# Patient Record
Sex: Male | Born: 1955
Health system: Southern US, Community
[De-identification: ages and names within clinical notes are randomized; demographics above are authoritative.]

## PROBLEM LIST (undated history)

## (undated) DIAGNOSIS — R21 Rash and other nonspecific skin eruption: Secondary | ICD-10-CM

## (undated) DIAGNOSIS — I491 Atrial premature depolarization: Secondary | ICD-10-CM

## (undated) DIAGNOSIS — M545 Low back pain, unspecified: Secondary | ICD-10-CM

## (undated) DIAGNOSIS — K047 Periapical abscess without sinus: Secondary | ICD-10-CM

## (undated) DIAGNOSIS — E785 Hyperlipidemia, unspecified: Secondary | ICD-10-CM

## (undated) DIAGNOSIS — I10 Essential (primary) hypertension: Secondary | ICD-10-CM

## (undated) DIAGNOSIS — G8929 Other chronic pain: Secondary | ICD-10-CM

## (undated) DIAGNOSIS — M542 Cervicalgia: Secondary | ICD-10-CM

## (undated) HISTORY — DX: Cervicalgia: M54.2

## (undated) HISTORY — DX: Hyperlipidemia, unspecified: E78.5

## (undated) HISTORY — DX: Low back pain: M54.5

## (undated) HISTORY — DX: Atrial premature depolarization: I49.1

## (undated) HISTORY — PX: OTHER SURGICAL HISTORY: SHX169

## (undated) HISTORY — DX: Low back pain, unspecified: M54.50

## (undated) HISTORY — DX: Other chronic pain: G89.29

---
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
## 2004-03-26 ENCOUNTER — Emergency Department (HOSPITAL_COMMUNITY): Admission: EM | Admit: 2004-03-26 | Discharge: 2004-03-26 | Payer: Self-pay | Admitting: Emergency Medicine

## 2004-10-24 ENCOUNTER — Ambulatory Visit: Payer: Self-pay | Admitting: Internal Medicine

## 2004-10-29 ENCOUNTER — Ambulatory Visit: Payer: Self-pay | Admitting: Internal Medicine

## 2004-11-13 ENCOUNTER — Ambulatory Visit: Payer: Self-pay | Admitting: Internal Medicine

## 2004-12-14 ENCOUNTER — Ambulatory Visit: Payer: Self-pay | Admitting: Internal Medicine

## 2005-04-26 ENCOUNTER — Ambulatory Visit: Payer: Self-pay | Admitting: *Deleted

## 2005-04-29 ENCOUNTER — Emergency Department (HOSPITAL_COMMUNITY): Admission: EM | Admit: 2005-04-29 | Discharge: 2005-04-29 | Payer: Self-pay | Admitting: Emergency Medicine

## 2005-05-14 ENCOUNTER — Ambulatory Visit: Payer: Self-pay | Admitting: Internal Medicine

## 2005-07-16 ENCOUNTER — Ambulatory Visit: Payer: Self-pay | Admitting: Internal Medicine

## 2005-10-24 ENCOUNTER — Ambulatory Visit: Payer: Self-pay | Admitting: Internal Medicine

## 2006-01-14 ENCOUNTER — Ambulatory Visit: Payer: Self-pay | Admitting: Internal Medicine

## 2006-04-10 ENCOUNTER — Ambulatory Visit: Payer: Self-pay | Admitting: Internal Medicine

## 2006-09-08 ENCOUNTER — Ambulatory Visit: Payer: Self-pay | Admitting: Internal Medicine

## 2006-09-30 ENCOUNTER — Ambulatory Visit: Payer: Self-pay | Admitting: Internal Medicine

## 2006-09-30 LAB — CONVERTED CEMR LAB
AST: 22 units/L (ref 0–37)
Alkaline Phosphatase: 47 units/L (ref 39–117)
BUN: 16 mg/dL (ref 6–23)
Calcium: 9.5 mg/dL (ref 8.4–10.5)
Creatinine, Ser: 1.05 mg/dL (ref 0.40–1.50)

## 2006-11-26 ENCOUNTER — Encounter (INDEPENDENT_AMBULATORY_CARE_PROVIDER_SITE_OTHER): Payer: Self-pay | Admitting: *Deleted

## 2007-03-18 ENCOUNTER — Ambulatory Visit: Payer: Self-pay | Admitting: Internal Medicine

## 2007-04-06 ENCOUNTER — Ambulatory Visit: Payer: Self-pay | Admitting: Family Medicine

## 2007-05-20 ENCOUNTER — Ambulatory Visit: Payer: Self-pay | Admitting: Internal Medicine

## 2007-07-22 ENCOUNTER — Ambulatory Visit: Payer: Self-pay | Admitting: Internal Medicine

## 2007-07-22 ENCOUNTER — Encounter: Payer: Self-pay | Admitting: Family Medicine

## 2007-07-22 LAB — CONVERTED CEMR LAB
Albumin: 4.4 g/dL (ref 3.5–5.2)
CO2: 22 meq/L (ref 19–32)
Calcium: 9.4 mg/dL (ref 8.4–10.5)
Chloride: 107 meq/L (ref 96–112)
Eosinophils Absolute: 0.4 10*3/uL (ref 0.0–0.7)
Glucose, Bld: 95 mg/dL (ref 70–99)
HCT: 43.6 % (ref 39.0–52.0)
Lymphocytes Relative: 23 % (ref 12–46)
Lymphs Abs: 1.7 10*3/uL (ref 0.7–4.0)
MCV: 81 fL (ref 78.0–100.0)
Monocytes Relative: 8 % (ref 3–12)
Neutrophils Relative %: 64 % (ref 43–77)
RBC: 5.38 M/uL (ref 4.22–5.81)
Sodium: 142 meq/L (ref 135–145)
TSH: 1.583 microintl units/mL (ref 0.350–5.50)
Total Bilirubin: 0.5 mg/dL (ref 0.3–1.2)
Total Protein: 8.1 g/dL (ref 6.0–8.3)
WBC: 7.4 10*3/uL (ref 4.0–10.5)

## 2007-08-27 ENCOUNTER — Ambulatory Visit: Payer: Self-pay | Admitting: Internal Medicine

## 2007-12-12 ENCOUNTER — Emergency Department (HOSPITAL_COMMUNITY): Admission: EM | Admit: 2007-12-12 | Discharge: 2007-12-12 | Payer: Self-pay | Admitting: Emergency Medicine

## 2007-12-23 ENCOUNTER — Ambulatory Visit: Payer: Self-pay | Admitting: Internal Medicine

## 2008-04-06 ENCOUNTER — Ambulatory Visit: Payer: Self-pay | Admitting: Internal Medicine

## 2008-04-07 ENCOUNTER — Ambulatory Visit: Payer: Self-pay | Admitting: Internal Medicine

## 2008-07-06 ENCOUNTER — Ambulatory Visit: Payer: Self-pay | Admitting: Internal Medicine

## 2008-09-14 ENCOUNTER — Ambulatory Visit: Payer: Self-pay | Admitting: Internal Medicine

## 2008-10-19 ENCOUNTER — Ambulatory Visit: Payer: Self-pay | Admitting: Internal Medicine

## 2009-03-08 ENCOUNTER — Ambulatory Visit: Payer: Self-pay | Admitting: Internal Medicine

## 2009-03-14 ENCOUNTER — Inpatient Hospital Stay (HOSPITAL_COMMUNITY): Admission: EM | Admit: 2009-03-14 | Discharge: 2009-03-17 | Payer: Self-pay | Admitting: Emergency Medicine

## 2009-06-05 ENCOUNTER — Ambulatory Visit: Payer: Self-pay | Admitting: Internal Medicine

## 2009-06-05 LAB — CONVERTED CEMR LAB
ALT: 48 units/L (ref 0–53)
AST: 20 units/L (ref 0–37)
BUN: 12 mg/dL (ref 6–23)
Basophils Absolute: 0 10*3/uL (ref 0.0–0.1)
Basophils Relative: 0 % (ref 0–1)
Creatinine, Ser: 0.84 mg/dL (ref 0.40–1.50)
Eosinophils Absolute: 0.2 10*3/uL (ref 0.0–0.7)
Eosinophils Relative: 4 % (ref 0–5)
HCT: 41.4 % (ref 39.0–52.0)
Hgb A1c MFr Bld: 6.1 % (ref 4.6–6.1)
MCHC: 34.1 g/dL (ref 30.0–36.0)
MCV: 80.9 fL (ref 78.0–100.0)
Neutrophils Relative %: 61 % (ref 43–77)
Platelets: 220 10*3/uL (ref 150–400)
RDW: 14.1 % (ref 11.5–15.5)
Total Bilirubin: 0.5 mg/dL (ref 0.3–1.2)
WBC: 5.6 10*3/uL (ref 4.0–10.5)

## 2009-09-05 ENCOUNTER — Ambulatory Visit (HOSPITAL_COMMUNITY): Admission: RE | Admit: 2009-09-05 | Discharge: 2009-09-05 | Payer: Self-pay | Admitting: Urology

## 2009-10-10 ENCOUNTER — Ambulatory Visit: Payer: Self-pay | Admitting: Internal Medicine

## 2009-10-10 LAB — CONVERTED CEMR LAB
CO2: 23 meq/L (ref 19–32)
Calcium: 9.7 mg/dL (ref 8.4–10.5)
Creatinine, Ser: 0.95 mg/dL (ref 0.40–1.50)
Glucose, Bld: 121 mg/dL — ABNORMAL HIGH (ref 70–99)
HCV Ab: NEGATIVE
Hep B Core Total Ab: POSITIVE — AB
Hep B S Ab: POSITIVE — AB
Hgb A1c MFr Bld: 5.9 % — ABNORMAL HIGH (ref <5.7)
Microalb, Ur: 55.21 mg/dL — ABNORMAL HIGH (ref 0.00–1.89)
Sodium: 141 meq/L (ref 135–145)

## 2009-11-20 ENCOUNTER — Inpatient Hospital Stay (HOSPITAL_COMMUNITY): Admission: EM | Admit: 2009-11-20 | Discharge: 2009-11-24 | Payer: Self-pay | Admitting: Emergency Medicine

## 2009-12-04 ENCOUNTER — Encounter (INDEPENDENT_AMBULATORY_CARE_PROVIDER_SITE_OTHER): Payer: Self-pay | Admitting: Internal Medicine

## 2009-12-04 LAB — CONVERTED CEMR LAB
Basophils Absolute: 0 10*3/uL (ref 0.0–0.1)
Basophils Relative: 0 % (ref 0–1)
Eosinophils Absolute: 0.2 10*3/uL (ref 0.0–0.7)
Hgb A1c MFr Bld: 7.1 % — ABNORMAL HIGH (ref <5.7)
MCHC: 33.2 g/dL (ref 30.0–36.0)
MCV: 81.4 fL (ref 78.0–100.0)
Monocytes Relative: 6 % (ref 3–12)
Neutro Abs: 6.8 10*3/uL (ref 1.7–7.7)
Neutrophils Relative %: 76 % (ref 43–77)
Platelets: 373 10*3/uL (ref 150–400)
RBC: 4.89 M/uL (ref 4.22–5.81)

## 2010-04-01 ENCOUNTER — Encounter: Payer: Self-pay | Admitting: Urology

## 2010-05-24 LAB — BASIC METABOLIC PANEL
BUN: 8 mg/dL (ref 6–23)
BUN: 11 mg/dL (ref 6–23)
CO2: 24 mEq/L (ref 19–32)
CO2: 25 mEq/L (ref 19–32)
Calcium: 8.5 mg/dL (ref 8.4–10.5)
Calcium: 8.6 mg/dL (ref 8.4–10.5)
Chloride: 107 mEq/L (ref 96–112)
Creatinine, Ser: 1.01 mg/dL (ref 0.4–1.5)
Creatinine, Ser: 1.13 mg/dL (ref 0.4–1.5)
Creatinine, Ser: 1.2 mg/dL (ref 0.4–1.5)
GFR calc Af Amer: >60 mL/min (ref >60)
GFR calc Af Amer: >60 mL/min (ref >60)
GFR calc non Af Amer: >60 mL/min (ref >60)
GFR calc non Af Amer: >60 mL/min (ref >60)
GFR calc non Af Amer: >60 mL/min (ref >60)
Glucose, Bld: 154 mg/dL — ABNORMAL HIGH (ref 70–99)
Glucose, Bld: 129 mg/dL — ABNORMAL HIGH (ref 70–99)
Potassium: 3.6 mEq/L (ref 3.5–5.1)
Potassium: 3.6 mEq/L (ref 3.5–5.1)
Sodium: 139 mEq/L (ref 135–145)
Sodium: 140 mEq/L (ref 135–145)
Sodium: 138 mEq/L (ref 135–145)

## 2010-05-24 LAB — GLUCOSE, CAPILLARY
Glucose-Capillary: 152 mg/dL — ABNORMAL HIGH (ref 70–99)
Glucose-Capillary: 164 mg/dL — ABNORMAL HIGH (ref 70–99)
Glucose-Capillary: 165 mg/dL — ABNORMAL HIGH (ref 70–99)
Glucose-Capillary: 189 mg/dL — ABNORMAL HIGH (ref 70–99)
Glucose-Capillary: 110 mg/dL — ABNORMAL HIGH (ref 70–99)
Glucose-Capillary: 134 mg/dL — ABNORMAL HIGH (ref 70–99)
Glucose-Capillary: 138 mg/dL — ABNORMAL HIGH (ref 70–99)
Glucose-Capillary: 143 mg/dL — ABNORMAL HIGH (ref 70–99)
Glucose-Capillary: 147 mg/dL — ABNORMAL HIGH (ref 70–99)
Glucose-Capillary: 149 mg/dL — ABNORMAL HIGH (ref 70–99)
Glucose-Capillary: 161 mg/dL — ABNORMAL HIGH (ref 70–99)
Glucose-Capillary: 167 mg/dL — ABNORMAL HIGH (ref 70–99)
Glucose-Capillary: 95 mg/dL (ref 70–99)

## 2010-05-24 LAB — URINALYSIS, ROUTINE W REFLEX MICROSCOPIC
Glucose, UA: NEGATIVE mg/dL
Hgb urine dipstick: NEGATIVE
Protein, ur: 100 mg/dL — AB

## 2010-05-24 LAB — CULTURE, BLOOD (ROUTINE X 2)
Culture  Setup Time: 201109130816
Culture  Setup Time: 201109130816
Culture: NO GROWTH

## 2010-05-24 LAB — CBC
HCT: 38.7 % — ABNORMAL LOW (ref 39.0–52.0)
HCT: 41.8 % (ref 39.0–52.0)
HCT: 44.3 % (ref 39.0–52.0)
Hemoglobin: 13 g/dL (ref 13.0–17.0)
Hemoglobin: 13.2 g/dL (ref 13.0–17.0)
Hemoglobin: 14.5 g/dL (ref 13.0–17.0)
Hemoglobin: 15.1 g/dL (ref 13.0–17.0)
MCH: 28.3 pg (ref 26.0–34.0)
MCH: 28.3 pg (ref 26.0–34.0)
MCH: 29.1 pg (ref 26.0–34.0)
MCHC: 34.2 g/dL (ref 30.0–36.0)
MCV: 84 fL (ref 78.0–100.0)
Platelets: 157 10*3/uL (ref 150–400)
Platelets: 135 10*3/uL — ABNORMAL LOW (ref 150–400)
RBC: 4.6 MIL/uL (ref 4.22–5.81)
RBC: 4.98 MIL/uL (ref 4.22–5.81)
RDW: 15.3 % (ref 11.5–15.5)
RDW: 16 % — ABNORMAL HIGH (ref 11.5–15.5)
WBC: 8.3 10*3/uL (ref 4.0–10.5)
WBC: 15.3 10*3/uL — ABNORMAL HIGH (ref 4.0–10.5)
WBC: 16.2 10*3/uL — ABNORMAL HIGH (ref 4.0–10.5)
WBC: 17.8 10*3/uL — ABNORMAL HIGH (ref 4.0–10.5)

## 2010-05-24 LAB — DIFFERENTIAL
Basophils Absolute: 0 10*3/uL (ref 0.0–0.1)
Basophils Relative: 0 % (ref 0–1)
Basophils Relative: 0 % (ref 0–1)
Eosinophils Relative: 0 % (ref 0–5)
Eosinophils Relative: 0 % (ref 0–5)
Lymphocytes Relative: 6 % — ABNORMAL LOW (ref 12–46)
Lymphocytes Relative: 7 % — ABNORMAL LOW (ref 12–46)
Lymphs Abs: 1.1 10*3/uL (ref 0.7–4.0)
Monocytes Relative: 5 % (ref 3–12)
Neutro Abs: 13.2 10*3/uL — ABNORMAL HIGH (ref 1.7–7.7)
Neutro Abs: 14.3 10*3/uL — ABNORMAL HIGH (ref 1.7–7.7)

## 2010-05-24 LAB — URINE MICROSCOPIC-ADD ON

## 2010-05-24 LAB — CLOSTRIDIUM DIFFICILE BY PCR: Toxigenic C. Difficile by PCR: NOT DETECTED

## 2010-05-27 LAB — BASIC METABOLIC PANEL
CO2: 23 mEq/L (ref 19–32)
CO2: 25 mEq/L (ref 19–32)
Calcium: 8.5 mg/dL (ref 8.4–10.5)
Calcium: 8.5 mg/dL (ref 8.4–10.5)
Calcium: 8.7 mg/dL (ref 8.4–10.5)
Chloride: 106 mEq/L (ref 96–112)
Creatinine, Ser: 1.05 mg/dL (ref 0.4–1.5)
GFR calc Af Amer: >60 mL/min (ref >60)
GFR calc Af Amer: >60 mL/min (ref >60)
GFR calc Af Amer: >60 mL/min (ref >60)
GFR calc non Af Amer: >60 mL/min (ref >60)
GFR calc non Af Amer: >60 mL/min (ref >60)
Glucose, Bld: 153 mg/dL — ABNORMAL HIGH (ref 70–99)
Sodium: 135 mEq/L (ref 135–145)
Sodium: 137 mEq/L (ref 135–145)

## 2010-05-27 LAB — COMPREHENSIVE METABOLIC PANEL
BUN: 8 mg/dL (ref 6–23)
CO2: 25 mEq/L (ref 19–32)
Calcium: 8.8 mg/dL (ref 8.4–10.5)
Chloride: 97 mEq/L (ref 96–112)
Creatinine, Ser: 1.07 mg/dL (ref 0.4–1.5)
GFR calc non Af Amer: >60 mL/min (ref >60)
Total Bilirubin: 1.2 mg/dL (ref 0.3–1.2)

## 2010-05-27 LAB — URINALYSIS, ROUTINE W REFLEX MICROSCOPIC
Bilirubin Urine: NEGATIVE
Nitrite: NEGATIVE
Specific Gravity, Urine: 1.008 (ref 1.005–1.030)
Urobilinogen, UA: 0.2 mg/dL (ref 0.0–1.0)
pH: 6.5 (ref 5.0–8.0)

## 2010-05-27 LAB — CBC
HCT: 42.2 % (ref 39.0–52.0)
Hemoglobin: 13.5 g/dL (ref 13.0–17.0)
MCHC: 33.9 g/dL (ref 30.0–36.0)
MCHC: 34.2 g/dL (ref 30.0–36.0)
MCHC: 34.2 g/dL (ref 30.0–36.0)
MCHC: 34.3 g/dL (ref 30.0–36.0)
MCV: 84.4 fL (ref 78.0–100.0)
MCV: 84.6 fL (ref 78.0–100.0)
Platelets: 117 10*3/uL — ABNORMAL LOW (ref 150–400)
RBC: 4.5 MIL/uL (ref 4.22–5.81)
RBC: 4.7 MIL/uL (ref 4.22–5.81)
RBC: 5 MIL/uL (ref 4.22–5.81)
RDW: 14.4 % (ref 11.5–15.5)
WBC: 17.6 10*3/uL — ABNORMAL HIGH (ref 4.0–10.5)
WBC: 9.4 10*3/uL (ref 4.0–10.5)

## 2010-05-27 LAB — GLUCOSE, CAPILLARY
Glucose-Capillary: 110 mg/dL — ABNORMAL HIGH (ref 70–99)
Glucose-Capillary: 120 mg/dL — ABNORMAL HIGH (ref 70–99)
Glucose-Capillary: 127 mg/dL — ABNORMAL HIGH (ref 70–99)
Glucose-Capillary: 134 mg/dL — ABNORMAL HIGH (ref 70–99)
Glucose-Capillary: 138 mg/dL — ABNORMAL HIGH (ref 70–99)
Glucose-Capillary: 142 mg/dL — ABNORMAL HIGH (ref 70–99)
Glucose-Capillary: 143 mg/dL — ABNORMAL HIGH (ref 70–99)
Glucose-Capillary: 146 mg/dL — ABNORMAL HIGH (ref 70–99)

## 2010-05-27 LAB — LIPASE, BLOOD: Lipase: 27 U/L (ref 11–59)

## 2010-05-27 LAB — CREATININE, SERUM: GFR calc non Af Amer: >60 mL/min (ref >60)

## 2010-05-27 LAB — DIFFERENTIAL
Basophils Absolute: 0 10*3/uL (ref 0.0–0.1)
Lymphocytes Relative: 4 % — ABNORMAL LOW (ref 12–46)
Lymphs Abs: 0.7 10*3/uL (ref 0.7–4.0)
Neutro Abs: 15.9 10*3/uL — ABNORMAL HIGH (ref 1.7–7.7)
Neutrophils Relative %: 90 % — ABNORMAL HIGH (ref 43–77)

## 2010-05-27 LAB — URINE CULTURE: Colony Count: 5000

## 2010-05-27 LAB — URINE MICROSCOPIC-ADD ON

## 2010-05-27 LAB — HEMOGLOBIN A1C
Hgb A1c MFr Bld: 6.3 % — ABNORMAL HIGH (ref 4.6–6.1)
Mean Plasma Glucose: 134 mg/dL

## 2010-05-27 LAB — CULTURE, BLOOD (ROUTINE X 2): Culture: NO GROWTH

## 2010-05-27 LAB — LACTIC ACID, PLASMA: Lactic Acid, Venous: 1.9 mmol/L (ref 0.5–2.2)

## 2010-06-06 ENCOUNTER — Encounter (INDEPENDENT_AMBULATORY_CARE_PROVIDER_SITE_OTHER): Payer: Self-pay | Admitting: *Deleted

## 2010-08-12 ENCOUNTER — Emergency Department (HOSPITAL_COMMUNITY)
Admission: EM | Admit: 2010-08-12 | Discharge: 2010-08-12 | Disposition: A | Discharge status: Home / Self Care | Payer: Self-pay | Attending: Emergency Medicine | Admitting: Emergency Medicine

## 2010-08-12 ENCOUNTER — Emergency Department (HOSPITAL_COMMUNITY): Payer: Self-pay

## 2010-08-12 DIAGNOSIS — N509 Disorder of male genital organs, unspecified: Secondary | ICD-10-CM | POA: Insufficient documentation

## 2010-08-12 DIAGNOSIS — I10 Essential (primary) hypertension: Secondary | ICD-10-CM | POA: Insufficient documentation

## 2010-08-12 DIAGNOSIS — E119 Type 2 diabetes mellitus without complications: Secondary | ICD-10-CM | POA: Insufficient documentation

## 2010-08-12 DIAGNOSIS — N390 Urinary tract infection, site not specified: Secondary | ICD-10-CM | POA: Insufficient documentation

## 2010-08-12 DIAGNOSIS — N453 Epididymo-orchitis: Secondary | ICD-10-CM | POA: Insufficient documentation

## 2010-08-12 LAB — URINALYSIS, ROUTINE W REFLEX MICROSCOPIC
Bilirubin Urine: NEGATIVE
Glucose, UA: NEGATIVE mg/dL
Ketones, ur: NEGATIVE mg/dL
Nitrite: NEGATIVE
Protein, ur: NEGATIVE mg/dL
Specific Gravity, Urine: 1.013 (ref 1.005–1.030)
Urobilinogen, UA: 0.2 mg/dL (ref 0.0–1.0)
pH: 6 (ref 5.0–8.0)

## 2010-08-12 LAB — URINE MICROSCOPIC-ADD ON

## 2010-08-13 LAB — URINE CULTURE
Colony Count: 5000
Culture  Setup Time: 201206031726

## 2010-12-10 LAB — CARDIAC PANEL(CRET KIN+CKTOT+MB+TROPI)
CK, MB: 3.3
Relative Index: 2.4
Troponin I: <0.01

## 2010-12-10 LAB — URINALYSIS, ROUTINE W REFLEX MICROSCOPIC
Bilirubin Urine: NEGATIVE
Glucose, UA: 100 — AB
Hgb urine dipstick: NEGATIVE
Protein, ur: 100 — AB
Specific Gravity, Urine: 1.018
Urobilinogen, UA: 0.2

## 2010-12-10 LAB — COMPREHENSIVE METABOLIC PANEL
AST: 25
Albumin: 3.5
BUN: 11
Chloride: 106
Creatinine, Ser: 0.9
GFR calc Af Amer: >60
Total Protein: 7

## 2010-12-10 LAB — DIFFERENTIAL
Basophils Relative: 1
Eosinophils Absolute: 0.3
Lymphs Abs: 2.1
Monocytes Absolute: 0.5
Monocytes Relative: 7

## 2010-12-10 LAB — CBC
Hemoglobin: 15.3
MCHC: 33.4
MCV: 82.6
RBC: 5.53
WBC: 6.5

## 2010-12-10 LAB — URINE MICROSCOPIC-ADD ON

## 2010-12-29 ENCOUNTER — Emergency Department (HOSPITAL_COMMUNITY): Payer: No Typology Code available for payment source

## 2010-12-29 ENCOUNTER — Encounter (HOSPITAL_COMMUNITY): Payer: Self-pay | Admitting: Radiology

## 2010-12-29 ENCOUNTER — Emergency Department (HOSPITAL_COMMUNITY)
Admission: EM | Admit: 2010-12-29 | Discharge: 2010-12-29 | Disposition: A | Discharge status: Home / Self Care | Payer: No Typology Code available for payment source | Attending: Emergency Medicine | Admitting: Emergency Medicine

## 2010-12-29 DIAGNOSIS — S8010XA Contusion of unspecified lower leg, initial encounter: Secondary | ICD-10-CM | POA: Insufficient documentation

## 2010-12-29 DIAGNOSIS — R51 Headache: Secondary | ICD-10-CM | POA: Insufficient documentation

## 2010-12-29 DIAGNOSIS — R079 Chest pain, unspecified: Secondary | ICD-10-CM | POA: Insufficient documentation

## 2010-12-29 DIAGNOSIS — S0990XA Unspecified injury of head, initial encounter: Secondary | ICD-10-CM | POA: Insufficient documentation

## 2010-12-29 DIAGNOSIS — R42 Dizziness and giddiness: Secondary | ICD-10-CM | POA: Insufficient documentation

## 2010-12-29 DIAGNOSIS — M79609 Pain in unspecified limb: Secondary | ICD-10-CM | POA: Insufficient documentation

## 2010-12-29 DIAGNOSIS — E119 Type 2 diabetes mellitus without complications: Secondary | ICD-10-CM | POA: Insufficient documentation

## 2010-12-29 DIAGNOSIS — I10 Essential (primary) hypertension: Secondary | ICD-10-CM | POA: Insufficient documentation

## 2010-12-29 DIAGNOSIS — R109 Unspecified abdominal pain: Secondary | ICD-10-CM | POA: Insufficient documentation

## 2010-12-29 DIAGNOSIS — S5010XA Contusion of unspecified forearm, initial encounter: Secondary | ICD-10-CM | POA: Insufficient documentation

## 2010-12-29 DIAGNOSIS — IMO0002 Reserved for concepts with insufficient information to code with codable children: Secondary | ICD-10-CM | POA: Insufficient documentation

## 2010-12-29 DIAGNOSIS — M2550 Pain in unspecified joint: Secondary | ICD-10-CM | POA: Insufficient documentation

## 2010-12-29 DIAGNOSIS — M542 Cervicalgia: Secondary | ICD-10-CM | POA: Insufficient documentation

## 2010-12-29 HISTORY — DX: Essential (primary) hypertension: I10

## 2010-12-29 LAB — POCT I-STAT, CHEM 8
Calcium, Ion: 1.17 mmol/L (ref 1.12–1.32)
Creatinine, Ser: 1.2 mg/dL (ref 0.50–1.35)
Glucose, Bld: 174 mg/dL — ABNORMAL HIGH (ref 70–99)
Hemoglobin: 15.6 g/dL (ref 13.0–17.0)
Sodium: 141 mEq/L (ref 135–145)
TCO2: 27 mmol/L (ref 0–100)

## 2010-12-29 MED ORDER — IOHEXOL 300 MG/ML  SOLN
100.0000 mL | Freq: Once | INTRAMUSCULAR | Status: AC | PRN
  Administered 2010-12-29: 100 mL via INTRAVENOUS

## 2012-06-25 ENCOUNTER — Emergency Department (HOSPITAL_COMMUNITY)
Admission: EM | Admit: 2012-06-25 | Discharge: 2012-06-25 | Disposition: A | Discharge status: Home / Self Care | Payer: Worker's Compensation | Attending: Emergency Medicine | Admitting: Emergency Medicine

## 2012-06-25 ENCOUNTER — Emergency Department (HOSPITAL_COMMUNITY): Payer: Worker's Compensation

## 2012-06-25 ENCOUNTER — Encounter (HOSPITAL_COMMUNITY): Payer: Self-pay | Admitting: Emergency Medicine

## 2012-06-25 DIAGNOSIS — W010XXA Fall on same level from slipping, tripping and stumbling without subsequent striking against object, initial encounter: Secondary | ICD-10-CM | POA: Insufficient documentation

## 2012-06-25 DIAGNOSIS — Y9289 Other specified places as the place of occurrence of the external cause: Secondary | ICD-10-CM | POA: Insufficient documentation

## 2012-06-25 DIAGNOSIS — Y990 Civilian activity done for income or pay: Secondary | ICD-10-CM | POA: Insufficient documentation

## 2012-06-25 DIAGNOSIS — IMO0002 Reserved for concepts with insufficient information to code with codable children: Secondary | ICD-10-CM | POA: Insufficient documentation

## 2012-06-25 DIAGNOSIS — T148XXA Other injury of unspecified body region, initial encounter: Secondary | ICD-10-CM

## 2012-06-25 DIAGNOSIS — Z79899 Other long term (current) drug therapy: Secondary | ICD-10-CM | POA: Insufficient documentation

## 2012-06-25 DIAGNOSIS — Y939 Activity, unspecified: Secondary | ICD-10-CM | POA: Insufficient documentation

## 2012-06-25 DIAGNOSIS — E119 Type 2 diabetes mellitus without complications: Secondary | ICD-10-CM | POA: Insufficient documentation

## 2012-06-25 DIAGNOSIS — I10 Essential (primary) hypertension: Secondary | ICD-10-CM | POA: Insufficient documentation

## 2012-06-25 MED ORDER — DIAZEPAM 5 MG PO TABS
10.0000 mg | ORAL_TABLET | Freq: Once | ORAL | Status: AC
  Administered 2012-06-25: 10 mg via ORAL
  Filled 2012-06-25: qty 2

## 2012-06-25 MED ORDER — OXYCODONE-ACETAMINOPHEN 5-325 MG PO TABS: 2.0000 | ORAL_TABLET | ORAL | Status: DC | PRN

## 2012-06-25 MED ORDER — OXYCODONE-ACETAMINOPHEN 5-325 MG PO TABS
2.0000 | ORAL_TABLET | Freq: Once | ORAL | Status: AC
  Administered 2012-06-25: 2 via ORAL
  Filled 2012-06-25: qty 2

## 2012-06-25 MED ORDER — METHOCARBAMOL 750 MG PO TABS: 750.0000 mg | ORAL_TABLET | Freq: Four times a day (QID) | ORAL | Status: DC

## 2012-06-25 NOTE — ED Notes (Signed)
Patient transported to X-ray 

## 2012-06-25 NOTE — ED Provider Notes (Signed)
History     CSN: [id number]  Arrival date & time 06/25/12  1105   First MD Initiated Contact with Patient 06/25/12 1128      Chief Complaint  Patient presents with  . Back Pain    (Consider location/radiation/quality/duration/timing/severity/associated sxs/prior treatment) Patient is a 57 y.o. male presenting with back pain. The history is provided by the patient.  Back Pain  patient complaining of mid and lower back pain after a fall yesterday. No loss of consciousness but notes sharp pain worse with movement in his midthoracic and lower lumbar paraspinal regions. No loss of bowel or bladder function. No numbness or tingling down his legs. Pain is worse with any kind of movement. Prior history of back trauma in the past that did not require surgery. No treatment used prior to arrival. Denies any urinary symptoms  Past Medical History  Diagnosis Date  . Hypertension   . Diabetes mellitus     History reviewed. No pertinent past surgical history.  No family history on file.  History  Substance Use Topics  . Smoking status: Never Smoker   . Smokeless tobacco: Not on file  . Alcohol Use: No      Review of Systems  Musculoskeletal: Positive for back pain.  All other systems reviewed and are negative.    Allergies  Review of patient's allergies indicates no known allergies.  Home Medications   Current Outpatient Rx  Name  Route  Sig  Dispense  Refill  . diltiazem (DILACOR XR) 180 MG 24 hr capsule   Oral   Take 180 mg by mouth daily.         Marland Kitchen glipiZIDE (GLUCOTROL) 10 MG tablet   Oral   Take 10 mg by mouth 2 (two) times daily before a meal.         . hydrochlorothiazide (HYDRODIURIL) 25 MG tablet   Oral   Take 25 mg by mouth daily.         Marland Kitchen losartan (COZAAR) 50 MG tablet   Oral   Take 100 mg by mouth daily.         . metFORMIN (GLUCOPHAGE) 500 MG tablet   Oral   Take 500 mg by mouth 2 (two) times daily with a meal.         . metoprolol  tartrate (LOPRESSOR) 25 MG tablet   Oral   Take 25 mg by mouth 2 (two) times daily.           BP 172/101  Pulse 101  Temp(Src) 98.9 F (37.2 C) (Oral)  SpO2 98%  Physical Exam  Nursing note and vitals reviewed. Constitutional: He is oriented to person, place, and time. He appears well-developed and well-nourished.  Non-toxic appearance. No distress.  HENT:  Head: Normocephalic and atraumatic.  Eyes: Conjunctivae, EOM and lids are normal. Pupils are equal, round, and reactive to light.  Neck: Normal range of motion. Neck supple. No tracheal deviation present. No mass present.  Cardiovascular: Normal rate, regular rhythm and normal heart sounds.  Exam reveals no gallop.   No murmur heard. Pulmonary/Chest: Effort normal and breath sounds normal. No stridor. No respiratory distress. He has no decreased breath sounds. He has no wheezes. He has no rhonchi. He has no rales.  Abdominal: Soft. Normal appearance and bowel sounds are normal. He exhibits no distension. There is no tenderness. There is no rebound and no CVA tenderness.  Musculoskeletal: Normal range of motion. He exhibits no edema and no tenderness.  Back:  Neurological: He is alert and oriented to person, place, and time. He has normal strength. No cranial nerve deficit or sensory deficit. GCS eye subscore is 4. GCS verbal subscore is 5. GCS motor subscore is 6.  Skin: Skin is warm and dry. No abrasion and no rash noted.  Psychiatric: He has a normal mood and affect. His speech is normal and behavior is normal.    ED Course  Procedures (including critical care time)  Labs Reviewed - No data to display No results found.   No diagnosis found.    MDM  Patient given pain medication here feels better x-rays are negative he is stable for discharge        Toy Baker, MD 06/25/12 1256

## 2012-06-25 NOTE — ED Notes (Signed)
Pt states "I slipped on some grease on my job and fell and hurt my back" Pt denies LOC

## 2012-06-25 NOTE — ED Notes (Signed)
MD at bedside. 

## 2012-07-01 ENCOUNTER — Encounter (HOSPITAL_COMMUNITY): Payer: Self-pay | Admitting: Emergency Medicine

## 2012-07-01 ENCOUNTER — Emergency Department (HOSPITAL_COMMUNITY): Payer: Worker's Compensation

## 2012-07-01 ENCOUNTER — Emergency Department (HOSPITAL_COMMUNITY)
Admission: EM | Admit: 2012-07-01 | Discharge: 2012-07-02 | Disposition: A | Discharge status: Home / Self Care | Payer: Worker's Compensation | Attending: Emergency Medicine | Admitting: Emergency Medicine

## 2012-07-01 DIAGNOSIS — M545 Low back pain, unspecified: Secondary | ICD-10-CM | POA: Insufficient documentation

## 2012-07-01 DIAGNOSIS — E119 Type 2 diabetes mellitus without complications: Secondary | ICD-10-CM | POA: Insufficient documentation

## 2012-07-01 DIAGNOSIS — R109 Unspecified abdominal pain: Secondary | ICD-10-CM | POA: Insufficient documentation

## 2012-07-01 DIAGNOSIS — N281 Cyst of kidney, acquired: Secondary | ICD-10-CM | POA: Insufficient documentation

## 2012-07-01 DIAGNOSIS — G8911 Acute pain due to trauma: Secondary | ICD-10-CM | POA: Insufficient documentation

## 2012-07-01 DIAGNOSIS — I10 Essential (primary) hypertension: Secondary | ICD-10-CM | POA: Insufficient documentation

## 2012-07-01 DIAGNOSIS — Z79899 Other long term (current) drug therapy: Secondary | ICD-10-CM | POA: Insufficient documentation

## 2012-07-01 DIAGNOSIS — W19XXXD Unspecified fall, subsequent encounter: Secondary | ICD-10-CM

## 2012-07-01 LAB — CBC WITH DIFFERENTIAL/PLATELET
Basophils Relative: 0 % (ref 0–1)
Eosinophils Absolute: 0.2 10*3/uL (ref 0.0–0.7)
HCT: 44.9 % (ref 39.0–52.0)
Hemoglobin: 15.8 g/dL (ref 13.0–17.0)
Lymphs Abs: 2.5 10*3/uL (ref 0.7–4.0)
MCH: 27.5 pg (ref 26.0–34.0)
MCHC: 35.2 g/dL (ref 30.0–36.0)
MCV: 78.2 fL (ref 78.0–100.0)
Monocytes Absolute: 0.6 10*3/uL (ref 0.1–1.0)
Monocytes Relative: 8 % (ref 3–12)
RBC: 5.74 MIL/uL (ref 4.22–5.81)

## 2012-07-01 LAB — BASIC METABOLIC PANEL
BUN: 12 mg/dL (ref 6–23)
Creatinine, Ser: 0.81 mg/dL (ref 0.50–1.35)
GFR calc Af Amer: >90 mL/min (ref >90)
GFR calc non Af Amer: >90 mL/min (ref >90)
Glucose, Bld: 122 mg/dL — ABNORMAL HIGH (ref 70–99)

## 2012-07-01 LAB — URINALYSIS, ROUTINE W REFLEX MICROSCOPIC
Bilirubin Urine: NEGATIVE
Ketones, ur: NEGATIVE mg/dL
Nitrite: NEGATIVE
Specific Gravity, Urine: 1.017 (ref 1.005–1.030)
Urobilinogen, UA: 1 mg/dL (ref 0.0–1.0)
pH: 6 (ref 5.0–8.0)

## 2012-07-01 LAB — GLUCOSE, CAPILLARY: Glucose-Capillary: 103 mg/dL — ABNORMAL HIGH (ref 70–99)

## 2012-07-01 LAB — URINE MICROSCOPIC-ADD ON

## 2012-07-01 MED ORDER — IOHEXOL 300 MG/ML  SOLN
100.0000 mL | Freq: Once | INTRAMUSCULAR | Status: AC | PRN
  Administered 2012-07-01: 100 mL via INTRAVENOUS

## 2012-07-01 MED ORDER — IOHEXOL 300 MG/ML  SOLN
50.0000 mL | Freq: Once | INTRAMUSCULAR | Status: AC | PRN
  Administered 2012-07-01: 50 mL via ORAL

## 2012-07-01 MED ORDER — SODIUM CHLORIDE 0.9 % IV BOLUS (SEPSIS)
1000.0000 mL | Freq: Once | INTRAVENOUS | Status: AC
  Administered 2012-07-01: 1000 mL via INTRAVENOUS

## 2012-07-01 MED ORDER — MORPHINE SULFATE 4 MG/ML IJ SOLN
4.0000 mg | Freq: Once | INTRAMUSCULAR | Status: AC
  Administered 2012-07-01: 4 mg via INTRAVENOUS
  Filled 2012-07-01: qty 1

## 2012-07-01 MED ORDER — OXYCODONE-ACETAMINOPHEN 5-325 MG PO TABS: 2.0000 | ORAL_TABLET | ORAL | Status: DC | PRN

## 2012-07-01 NOTE — ED Provider Notes (Addendum)
History     CSN: [id number]  Arrival date & time 07/01/12  2036   First MD Initiated Contact with Patient 07/01/12 2135      Chief Complaint  Patient presents with  . Back Pain  . Abdominal Pain    (Consider location/radiation/quality/duration/timing/severity/associated sxs/prior treatment) Patient is a 57 y.o. male presenting with back pain and abdominal pain. The history is provided by the patient.  Back Pain Location:  Lumbar spine Quality:  Stabbing Radiates to:  Does not radiate Pain severity:  Severe Pain is:  Same all the time Onset quality:  Sudden Duration:  7 days Timing:  Constant Progression:  Worsening Chronicity:  New Context: falling   Relieved by:  Nothing Worsened by:  Bending, ambulation, palpation, sitting and twisting Ineffective treatments:  Narcotics Associated symptoms: abdominal pain   Associated symptoms: no fever   Abdominal Pain Associated symptoms: no fever     Past Medical History  Diagnosis Date  . Hypertension   . Diabetes mellitus     History reviewed. No pertinent past surgical history.  History reviewed. No pertinent family history.  History  Substance Use Topics  . Smoking status: Never Smoker   . Smokeless tobacco: Not on file  . Alcohol Use: No      Review of Systems  Constitutional: Negative for fever.  Gastrointestinal: Positive for abdominal pain.  Musculoskeletal: Positive for back pain.    Allergies  Review of patient's allergies indicates no known allergies.  Home Medications   Current Outpatient Rx  Name  Route  Sig  Dispense  Refill  . diltiazem (DILACOR XR) 180 MG 24 hr capsule   Oral   Take 180 mg by mouth daily.         Marland Kitchen glipiZIDE (GLUCOTROL) 10 MG tablet   Oral   Take 10 mg by mouth 2 (two) times daily before a meal.         . hydrochlorothiazide (HYDRODIURIL) 25 MG tablet   Oral   Take 25 mg by mouth daily.         Marland Kitchen losartan (COZAAR) 50 MG tablet   Oral   Take 100 mg by mouth  daily.         . metFORMIN (GLUCOPHAGE) 500 MG tablet   Oral   Take 500 mg by mouth 2 (two) times daily with a meal.         . methocarbamol (ROBAXIN-750) 750 MG tablet   Oral   Take 1 tablet (750 mg total) by mouth 4 (four) times daily.   30 tablet   0   . metoprolol tartrate (LOPRESSOR) 25 MG tablet   Oral   Take 25 mg by mouth 2 (two) times daily.         Marland Kitchen oxyCODONE-acetaminophen (PERCOCET/ROXICET) 5-325 MG per tablet   Oral   Take 2 tablets by mouth every 4 (four) hours as needed for pain.   15 tablet   0     BP 167/112  Pulse 61  Temp(Src) 98.6 F (37 C) (Oral)  Resp 18  SpO2 100%  Physical Exam  Nursing note and vitals reviewed. Constitutional: He is oriented to person, place, and time. He appears well-developed and well-nourished.  HENT:  Head: Normocephalic and atraumatic.  Mouth/Throat: Oropharynx is clear and moist.  Neck: Normal range of motion. Neck supple.  Cardiovascular: Normal rate and regular rhythm.   No murmur heard. Pulmonary/Chest: Effort normal and breath sounds normal. No respiratory distress. He has no wheezes.  Abdominal: Soft. Bowel sounds are normal. He exhibits no distension. There is no tenderness.  Musculoskeletal:  There is ttp in the soft tissues of the entire lumbar region.  No stepoffs or bony ttp.  Neurological: He is alert and oriented to person, place, and time. He has normal reflexes. He exhibits normal muscle tone. Coordination normal.  Skin: Skin is warm and dry.    ED Course  Procedures (including critical care time)  Labs Reviewed  CBC WITH DIFFERENTIAL  BASIC METABOLIC PANEL  URINALYSIS, ROUTINE W REFLEX MICROSCOPIC   No results found.   No diagnosis found.    MDM  The ct does not reveal any emergent intra-abdominal pathology.  There is a lesion in the upper pole of the right kidney, possibly a hemorrhagic cyst.  As the trauma occurred last week, there appears to be nothing emergent and I feel as though  he is stable for discharge.  Radiology is recommending a possible mri to further delineate the renal lesion.  I have relayed this to the patient and he is to follow this up with his pcp.          Geoffery Lyons, MD 07/01/12 0454  Geoffery Lyons, MD 07/15/12 0981

## 2012-07-01 NOTE — ED Notes (Signed)
As triaging the patient he reports that his pain is worse in the more in the morning and when trying to go to bed. The patient reports that at time the pain radiates to the abdominal area and epigastric region. Patient is hyertensive but not on medications

## 2012-07-01 NOTE — ED Notes (Signed)
Ice chips given

## 2012-07-01 NOTE — ED Notes (Signed)
Patient states that on the 16th he fell at his job and has caused him to have pain since.

## 2012-07-15 ENCOUNTER — Emergency Department (HOSPITAL_COMMUNITY)
Admission: EM | Admit: 2012-07-15 | Discharge: 2012-07-15 | Disposition: A | Discharge status: Home / Self Care | Payer: Self-pay | Source: Home / Self Care

## 2012-09-08 ENCOUNTER — Emergency Department (HOSPITAL_COMMUNITY)
Admission: EM | Admit: 2012-09-08 | Discharge: 2012-09-08 | Disposition: A | Discharge status: Home / Self Care | Payer: No Typology Code available for payment source | Attending: Emergency Medicine | Admitting: Emergency Medicine

## 2012-09-08 DIAGNOSIS — Z79899 Other long term (current) drug therapy: Secondary | ICD-10-CM | POA: Insufficient documentation

## 2012-09-08 DIAGNOSIS — I10 Essential (primary) hypertension: Secondary | ICD-10-CM | POA: Insufficient documentation

## 2012-09-08 DIAGNOSIS — R3915 Urgency of urination: Secondary | ICD-10-CM | POA: Insufficient documentation

## 2012-09-08 DIAGNOSIS — M549 Dorsalgia, unspecified: Secondary | ICD-10-CM

## 2012-09-08 DIAGNOSIS — R3989 Other symptoms and signs involving the genitourinary system: Secondary | ICD-10-CM | POA: Insufficient documentation

## 2012-09-08 DIAGNOSIS — E119 Type 2 diabetes mellitus without complications: Secondary | ICD-10-CM | POA: Insufficient documentation

## 2012-09-08 DIAGNOSIS — M545 Low back pain, unspecified: Secondary | ICD-10-CM | POA: Insufficient documentation

## 2012-09-08 MED ORDER — OXYCODONE-ACETAMINOPHEN 5-325 MG PO TABS: 2.0000 | ORAL_TABLET | ORAL | Status: DC | PRN

## 2012-09-08 MED ORDER — OXYCODONE-ACETAMINOPHEN 5-325 MG PO TABS
2.0000 | ORAL_TABLET | Freq: Once | ORAL | Status: AC
  Administered 2012-09-08: 2 via ORAL
  Filled 2012-09-08: qty 2

## 2012-09-08 MED ORDER — PROMETHAZINE HCL 25 MG PO TABS: 25.0000 mg | ORAL_TABLET | Freq: Four times a day (QID) | ORAL | Status: DC | PRN

## 2012-09-08 MED ORDER — ONDANSETRON 8 MG PO TBDP
8.0000 mg | ORAL_TABLET | Freq: Once | ORAL | Status: AC
  Administered 2012-09-08: 8 mg via ORAL
  Filled 2012-09-08: qty 1

## 2012-09-08 NOTE — ED Notes (Signed)
PA Merrell at bedside.  

## 2012-09-08 NOTE — ED Notes (Signed)
Pt c/o lower back pain for several months. Pt states "It's the third time I've been here for this and the pain is getting worse." Pt adamantly states he doesn't take anything for pain. Pt states he is able to walk, but states it is painful. Pt states someone drove him here. Pt wants an MRI to find out what is going on. Pt in wheelchair to exam room.

## 2012-09-08 NOTE — ED Provider Notes (Signed)
History    This chart was scribed for non-physician practitioner Junious Silk, PA working with Toy Baker, MD by Quintella Reichert, ED Scribe. This patient was seen in room WTR6/WTR6 and the patient's care was started at 9:36 PM .   CSN: [id number]  Arrival date & time 09/08/12  2034    Chief Complaint  Patient presents with  . Back Pain    The history is provided by the patient and medical records. No language interpreter was used.     HPI Comments: Jon Harper is a 57 y.o. male with h/o DM and HTN who presents to the Emergency Department complaining of constant, progressively-worsening, severe right-sided lower back pain that began 3 months ago.  Pt describes pain as sharp and stabbing and states it does not radiate.  Pain is greatly exacerbated by movement, coughing, or bumping against objects and is not relieved by anything.  He denies any specific injuries or other activities that may have caused pain.  Pt also notes intermittent urinary urgency and difficulty urinating.  He denies dysuria or bladder or bowel incontinence.  He denies fever, numbness, SOB, rash, or any other associated symptoms.  Pt notes that this is the 3rd time that he has been to the ED for the same pain.  He has not received a diagnosis for the pain but a CT-scan taken at his last visit revealed a lesion at the upper pole of the right kidney, possibly a hemorrhagic cyst.  Pt was advised to f/u with PCP to discuss the possibility of receiving an MRI to further delineate the renal lesion.  However pt notes that he does not have a PCP.  Pt denies h/o cancer.  He denies h/o drug abuse.  Pt medicates with metformin for his DM and does not use insulin.  Pt has no PCP   Past Medical History  Diagnosis Date  . Hypertension   . Diabetes mellitus    No past surgical history on file.  No family history on file.  History  Substance Use Topics  . Smoking status: Never Smoker   . Smokeless tobacco: Not on  file  . Alcohol Use: No     Review of Systems  Genitourinary:       No incontinence  Musculoskeletal: Positive for back pain.  All other systems reviewed and are negative.      Allergies  Review of patient's allergies indicates no known allergies.  Home Medications   Current Outpatient Rx  Name  Route  Sig  Dispense  Refill  . methocarbamol (ROBAXIN-750) 750 MG tablet   Oral   Take 1 tablet (750 mg total) by mouth 4 (four) times daily.   30 tablet   0   . diltiazem (DILACOR XR) 180 MG 24 hr capsule   Oral   Take 180 mg by mouth daily.         Marland Kitchen glipiZIDE (GLUCOTROL) 10 MG tablet   Oral   Take 10 mg by mouth 2 (two) times daily before a meal.         . hydrochlorothiazide (HYDRODIURIL) 25 MG tablet   Oral   Take 25 mg by mouth daily.         Marland Kitchen losartan (COZAAR) 50 MG tablet   Oral   Take 100 mg by mouth daily.         . metFORMIN (GLUCOPHAGE) 500 MG tablet   Oral   Take 500 mg by mouth 2 (two) times daily with  a meal.         . metoprolol tartrate (LOPRESSOR) 25 MG tablet   Oral   Take 25 mg by mouth 2 (two) times daily.         Marland Kitchen oxyCODONE-acetaminophen (PERCOCET/ROXICET) 5-325 MG per tablet   Oral   Take 2 tablets by mouth every 4 (four) hours as needed for pain.   15 tablet   0    BP 136/93  Pulse 85  Temp(Src) 99.4 F (37.4 C) (Oral)  Resp 17  SpO2 99%  Physical Exam  Nursing note and vitals reviewed. Constitutional: He is oriented to person, place, and time. He appears well-developed and well-nourished. No distress.  HENT:  Head: Normocephalic and atraumatic.  Right Ear: External ear normal.  Left Ear: External ear normal.  Nose: Nose normal.  Eyes: Conjunctivae are normal.  Neck: Normal range of motion. No tracheal deviation present.  Cardiovascular: Normal rate, regular rhythm and normal heart sounds.   Pulmonary/Chest: Effort normal and breath sounds normal. No stridor.  Abdominal: Soft. He exhibits no distension. There  is no tenderness.  Musculoskeletal: Normal range of motion. He exhibits tenderness.  Tender to palpation over right low back, no bony tenderness.  Neurological: He is alert and oriented to person, place, and time.  Normal gait No focal neuro deficits  Skin: Skin is warm and dry. He is not diaphoretic.  Psychiatric: He has a normal mood and affect. His behavior is normal.    ED Course  Procedures (including critical care time)  DIAGNOSTIC STUDIES: Oxygen Saturation is 99% on room air, normal by my interpretation.    COORDINATION OF CARE: 9:52 PM: Discussed treatment plan which includes pain medication.  He must get MRI from PCP. Discussed that the MRI the previous provider was referring to was for a lesion on his kidney. Pt expressed understanding and agreed to plan.   Labs Reviewed - No data to display  No results found.  1. Back pain     MDM  Patient with back pain.  No neurological deficits and normal neuro exam.  Patient can walk but states is painful.  No loss of bowel or bladder control.  No concern for cauda equina.  No fever, night sweats, weight loss, h/o cancer, IVDU.  Patient adamant that he needs an MRI. At this point, with his presentation there is no indication for emergency MRI. Discussed that he needs to follow up with a PCP. Return instructions given. Vital signs stable for discharge. Discussed case with Dr. Freida Busman who agrees with plan. Patient / Family / Caregiver informed of clinical course, understand medical decision-making process, and agree with plan.     I personally performed the services described in this documentation, which was scribed in my presence. The recorded information has been reviewed and is accurate.    Mora Bellman, PA-C 09/09/12 [phone]

## 2012-09-08 NOTE — ED Notes (Signed)
Pt walked to door of room to inquire how much longer it would be to be seen by PA.

## 2012-09-11 NOTE — ED Provider Notes (Signed)
Medical screening examination/treatment/procedure(s) were performed by non-physician practitioner and as supervising physician I was immediately available for consultation/collaboration.  Shamus Desantis T Adilenne Ashworth, MD 09/11/12 1517 

## 2012-10-13 ENCOUNTER — Ambulatory Visit: Payer: No Typology Code available for payment source | Attending: Internal Medicine

## 2012-10-13 DIAGNOSIS — IMO0001 Reserved for inherently not codable concepts without codable children: Secondary | ICD-10-CM | POA: Insufficient documentation

## 2012-10-13 DIAGNOSIS — M2560 Stiffness of unspecified joint, not elsewhere classified: Secondary | ICD-10-CM | POA: Insufficient documentation

## 2012-10-13 DIAGNOSIS — M2550 Pain in unspecified joint: Secondary | ICD-10-CM | POA: Insufficient documentation

## 2012-10-19 ENCOUNTER — Ambulatory Visit: Payer: No Typology Code available for payment source | Admitting: Physical Therapy

## 2012-10-21 ENCOUNTER — Ambulatory Visit: Payer: No Typology Code available for payment source

## 2012-10-26 ENCOUNTER — Ambulatory Visit: Payer: No Typology Code available for payment source | Admitting: Physical Therapy

## 2012-10-28 ENCOUNTER — Ambulatory Visit: Payer: No Typology Code available for payment source

## 2012-11-03 ENCOUNTER — Ambulatory Visit: Payer: No Typology Code available for payment source

## 2012-11-05 ENCOUNTER — Encounter (HOSPITAL_COMMUNITY): Payer: Self-pay | Admitting: Emergency Medicine

## 2012-11-05 ENCOUNTER — Emergency Department (HOSPITAL_COMMUNITY)
Admission: EM | Admit: 2012-11-05 | Discharge: 2012-11-05 | Disposition: A | Discharge status: Home / Self Care | Payer: No Typology Code available for payment source | Attending: Emergency Medicine | Admitting: Emergency Medicine

## 2012-11-05 ENCOUNTER — Emergency Department (HOSPITAL_COMMUNITY): Payer: No Typology Code available for payment source

## 2012-11-05 DIAGNOSIS — I10 Essential (primary) hypertension: Secondary | ICD-10-CM | POA: Insufficient documentation

## 2012-11-05 DIAGNOSIS — E119 Type 2 diabetes mellitus without complications: Secondary | ICD-10-CM | POA: Insufficient documentation

## 2012-11-05 DIAGNOSIS — M25569 Pain in unspecified knee: Secondary | ICD-10-CM | POA: Insufficient documentation

## 2012-11-05 DIAGNOSIS — Z79899 Other long term (current) drug therapy: Secondary | ICD-10-CM | POA: Insufficient documentation

## 2012-11-05 DIAGNOSIS — M25469 Effusion, unspecified knee: Secondary | ICD-10-CM | POA: Insufficient documentation

## 2012-11-05 DIAGNOSIS — M25561 Pain in right knee: Secondary | ICD-10-CM

## 2012-11-05 MED ORDER — OXYCODONE-ACETAMINOPHEN 5-325 MG PO TABS
2.0000 | ORAL_TABLET | Freq: Once | ORAL | Status: AC
  Administered 2012-11-05: 2 via ORAL
  Filled 2012-11-05: qty 2

## 2012-11-05 MED ORDER — HYDROCODONE-ACETAMINOPHEN 5-325 MG PO TABS: 1.0000 | ORAL_TABLET | Freq: Four times a day (QID) | ORAL | Status: DC | PRN

## 2012-11-05 NOTE — Progress Notes (Signed)
[facility] spoke with patient about his [employer]. Tried to make patient an apt with his pcp. Patient pcp is at [facility] [place]. Patient pcp told [facility] that pt already had an apt set up on 9/18. Patient made aware of the apt.

## 2012-11-05 NOTE — ED Provider Notes (Signed)
CSN: [id number]     Arrival date & time 11/05/12  1030 History   First MD Initiated Contact with Patient 11/05/12 1033     Chief Complaint  Patient presents with  . Knee Pain   (Consider location/radiation/quality/duration/timing/severity/associated sxs/prior Treatment) HPI Comments: Patient presents to the emergency department with chief complaint of right knee pain. He states that he is noticed right knee pain and swelling for the past 2 days. 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 Patient states the pain is severe, he is requesting pain medicine. He has not tried taking anything to alleviate his symptoms. Nothing makes his symptoms worse, nothing makes it better.   The history is provided by the patient. No language interpreter was used.    Past Medical History  Diagnosis Date  . Hypertension   . Diabetes mellitus    History reviewed. No pertinent past surgical history. No family history on file. History  Substance Use Topics  . Smoking status: Never Smoker   . Smokeless tobacco: Not on file  . Alcohol Use: No    Review of Systems  All other systems reviewed and are negative.    Allergies  Review of patient's allergies indicates no known allergies.  Home Medications   Current Outpatient Rx  Name  Route  Sig  Dispense  Refill  . diltiazem (DILACOR XR) 180 MG 24 hr capsule   Oral   Take 180 mg by mouth daily.         Marland Kitchen glipiZIDE (GLUCOTROL) 10 MG tablet   Oral   Take 10 mg by mouth 2 (two) times daily before a meal.         . hydrochlorothiazide (HYDRODIURIL) 25 MG tablet   Oral   Take 25 mg by mouth daily.         Marland Kitchen losartan (COZAAR) 50 MG tablet   Oral   Take 100 mg by mouth daily.         . metFORMIN (GLUCOPHAGE) 500 MG tablet   Oral   Take 500 mg by mouth 2 (two) times daily with a meal.         . methocarbamol (ROBAXIN-750) 750 MG tablet   Oral   Take 1 tablet (750 mg  total) by mouth 4 (four) times daily.   30 tablet   0   . metoprolol tartrate (LOPRESSOR) 25 MG tablet   Oral   Take 25 mg by mouth 2 (two) times daily.         Marland Kitchen oxyCODONE-acetaminophen (PERCOCET/ROXICET) 5-325 MG per tablet   Oral   Take 2 tablets by mouth every 4 (four) hours as needed for pain.   15 tablet   0   . oxyCODONE-acetaminophen (PERCOCET/ROXICET) 5-325 MG per tablet   Oral   Take 2 tablets by mouth every 4 (four) hours as needed for pain.   12 tablet   0   . promethazine (PHENERGAN) 25 MG tablet   Oral   Take 1 tablet (25 mg total) by mouth every 6 (six) hours as needed for nausea.   12 tablet   0    BP 114/72  Pulse 85  Temp(Src) 97.9 F (36.6 C) (Oral)  Resp 20  SpO2 98% Physical Exam  Nursing note and vitals reviewed. Constitutional: He is oriented to person, place, and time. He appears well-developed and well-nourished.  HENT:  Head: Normocephalic and atraumatic.  Eyes: Conjunctivae  and EOM are normal.  Neck: Normal range of motion.  Cardiovascular: Normal rate.   Pulmonary/Chest: Effort normal.  Abdominal: He exhibits no distension.  Musculoskeletal: Normal range of motion.  Right knee tenderness palpation over the medial and lateral aspects, some tenderness to the patella tendon specifically, no bony abnormalities or deformities, no obvious signs of joint effusion, patient is able to ambulate  Neurological: He is alert and oriented to person, place, and time.  Skin: Skin is dry.  Psychiatric: He has a normal mood and affect. His behavior is normal. Judgment and thought content normal.    ED Course  Procedures (including critical care time) Labs Review Labs Reviewed - No data to display Imaging Review Dg Knee Complete 4 Views Right  11/05/2012   *RADIOLOGY REPORT*  Clinical Data: Right knee pain and swelling  RIGHT KNEE - COMPLETE 4+ VIEW  Comparison: 12/29/2010  Findings: Four views of the right knee submitted.  No acute fracture or  subluxation.  There is narrowing of patellofemoral joint space.  Spurring of the patella at the insertion of the quadriceps and patellar tendon.  No joint effusion.  IMPRESSION: No acute fracture or subluxation.  Degenerative changes as described above.   Original Report Authenticated By: Natasha Mead, M.D.    MDM   1. Knee pain, acute, right    Patient with right knee pain. Will check plain films. Patient requests pain medicine. Patient is sitting in the hall, and did not appear uncomfortable, until he saw me come to see him. Some concern for malingering, this patient has been seen several times previously also requesting pain medicine.  Discussed x-ray results with patient. Will discharge to home. Recommend physical therapy, orthopedic referral. Patient understands and agrees. He again asks about pain medicine, and asks specifically, how many pills I can give him. I will give him a referral to pain management. Highly suspicious of drug-seeking behavior.    Roxy Horseman, PA-C 11/05/12 1256

## 2012-11-05 NOTE — Progress Notes (Addendum)
[facility] ED CM spoke with pt who confirmed orange card Discussed orange card not being a form of insurance EPIC updated to indicate pt seen a family medicine at [place] and see providers including sara young Plus others  CM noted generic worker's comp listed on Cm sheet but pt confirmed his injury is not job related/no injury occurred at his job

## 2012-11-05 NOTE — ED Notes (Signed)
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 Pt states positive swelling to right knee also

## 2012-11-05 NOTE — ED Provider Notes (Signed)
Medical screening examination/treatment/procedure(s) were performed by non-physician practitioner and as supervising physician I was immediately available for consultation/collaboration.   Junius Argyle, MD 11/05/12 2015

## 2013-01-07 ENCOUNTER — Emergency Department (HOSPITAL_COMMUNITY): Payer: No Typology Code available for payment source

## 2013-01-07 ENCOUNTER — Encounter (HOSPITAL_COMMUNITY): Payer: Self-pay | Admitting: Emergency Medicine

## 2013-01-07 ENCOUNTER — Emergency Department (HOSPITAL_COMMUNITY)
Admission: EM | Admit: 2013-01-07 | Discharge: 2013-01-07 | Disposition: A | Discharge status: Home / Self Care | Payer: No Typology Code available for payment source | Attending: Emergency Medicine | Admitting: Emergency Medicine

## 2013-01-07 DIAGNOSIS — IMO0002 Reserved for concepts with insufficient information to code with codable children: Secondary | ICD-10-CM | POA: Insufficient documentation

## 2013-01-07 DIAGNOSIS — Z79899 Other long term (current) drug therapy: Secondary | ICD-10-CM | POA: Insufficient documentation

## 2013-01-07 DIAGNOSIS — E119 Type 2 diabetes mellitus without complications: Secondary | ICD-10-CM | POA: Insufficient documentation

## 2013-01-07 DIAGNOSIS — I10 Essential (primary) hypertension: Secondary | ICD-10-CM | POA: Insufficient documentation

## 2013-01-07 DIAGNOSIS — L02511 Cutaneous abscess of right hand: Secondary | ICD-10-CM

## 2013-01-07 LAB — GLUCOSE, CAPILLARY: Glucose-Capillary: 172 mg/dL — ABNORMAL HIGH (ref 70–99)

## 2013-01-07 MED ORDER — OXYCODONE-ACETAMINOPHEN 5-325 MG PO TABS: 2.0000 | ORAL_TABLET | Freq: Four times a day (QID) | ORAL | Status: DC | PRN

## 2013-01-07 MED ORDER — OXYCODONE-ACETAMINOPHEN 5-325 MG PO TABS
2.0000 | ORAL_TABLET | Freq: Once | ORAL | Status: AC
  Administered 2013-01-07: 2 via ORAL
  Filled 2013-01-07: qty 2

## 2013-01-07 MED ORDER — CEPHALEXIN 500 MG PO CAPS: 500.0000 mg | ORAL_CAPSULE | Freq: Four times a day (QID) | ORAL | Status: DC

## 2013-01-07 NOTE — ED Provider Notes (Signed)
CSN: [id number]     Arrival date & time 01/07/13  1301 History   First MD Initiated Contact with Patient 01/07/13 1327     Chief Complaint  Patient presents with  . thumb pain     right   (Consider location/radiation/quality/duration/timing/severity/associated sxs/prior Treatment) HPI Comments: Patient presents emergency department with chief complaint of right thumb pain. States that approximately a week ago he thinks that he got a splinter in his right thumb. The area has now become infected and swollen. He states it is very painful to touch. It hurts all the time. He has not tried anything to alleviate his symptoms. Patient has diabetes. States pain is 10 out of 10. Nice fever, chills, nausea, or vomiting.  The history is provided by the patient. No language interpreter was used.    Past Medical History  Diagnosis Date  . Hypertension   . Diabetes mellitus    History reviewed. No pertinent past surgical history. No family history on file. History  Substance Use Topics  . Smoking status: Never Smoker   . Smokeless tobacco: Not on file  . Alcohol Use: No    Review of Systems  All other systems reviewed and are negative.    Allergies  Review of patient's allergies indicates no known allergies.  Home Medications   Current Outpatient Rx  Name  Route  Sig  Dispense  Refill  . diltiazem (DILACOR XR) 180 MG 24 hr capsule   Oral   Take 180 mg by mouth daily.         Marland Kitchen glipiZIDE (GLUCOTROL) 10 MG tablet   Oral   Take 10 mg by mouth 2 (two) times daily before a meal.         . hydrochlorothiazide (HYDRODIURIL) 25 MG tablet   Oral   Take 25 mg by mouth daily.         Marland Kitchen losartan (COZAAR) 50 MG tablet   Oral   Take 100 mg by mouth daily.         . metoprolol tartrate (LOPRESSOR) 25 MG tablet   Oral   Take 25 mg by mouth 2 (two) times daily.          BP 140/90  Pulse 93  Temp(Src) 98 F (36.7 C) (Oral)  Resp 20  SpO2 98% Physical Exam  Nursing note  and vitals reviewed. Constitutional: He is oriented to person, place, and time. He appears well-developed and well-nourished.  HENT:  Head: Normocephalic and atraumatic.  Eyes: Conjunctivae and EOM are normal.  Neck: Normal range of motion.  Cardiovascular: Normal rate.   Pulmonary/Chest: Effort normal.  Abdominal: He exhibits no distension.  Musculoskeletal: Normal range of motion.  Right thumb very tender to palpation, range of motion and strength deferred secondary to pain  Neurological: He is alert and oriented to person, place, and time.  Skin: Skin is dry.  Right thumb remarkable for felon  Psychiatric: He has a normal mood and affect. His behavior is normal. Judgment and thought content normal.    ED Course  Procedures (including critical care time) Labs Review Labs Reviewed - No data to display Imaging Review No results found. INCISION AND DRAINAGE Performed by: Roxy Horseman Consent: Verbal consent obtained. Risks and benefits: risks, benefits and alternatives were discussed Type: abscess  Body area: right distal radial thumb  Anesthesia: digital block  Incision was made with a scalpel.  Local anesthetic: lidocaine 2% without epinephrine  Anesthetic total: 5 ml  Complexity: complex Blunt dissection  to break up loculations  Drainage: purulent  Drainage amount: copious  Patient tolerance: Patient tolerated the procedure well with no immediate complications.    EKG Interpretation   None       MDM   1. Felon, right    Patient with felon of his right thumb. He is a diabetic patient. Patient discussed with Dr. Anitra Lauth, who recommends I&D and discharge home with keflex and pain meds.  Return precautions are given. Plan wound check in 2 days. Will discharge with Keflex and pain meds.      Roxy Horseman, PA-C 01/07/13 [phone]

## 2013-01-07 NOTE — ED Notes (Signed)
Pt states about a week ago he thinks he got a splinter in R thumb, states now painful and swollen.

## 2013-01-07 NOTE — ED Notes (Signed)
Bed: WTR9 Expected date: 01/07/13 Expected time:  Means of arrival:  Comments: Hold for procedure for [facility]

## 2013-01-11 NOTE — ED Provider Notes (Signed)
Medical screening examination/treatment/procedure(s) were performed by non-physician practitioner and as supervising physician I was immediately available for consultation/collaboration.  EKG Interpretation   None         Gwyneth Sprout, MD 01/11/13 [phone]

## 2013-12-22 ENCOUNTER — Emergency Department (HOSPITAL_COMMUNITY)
Admission: EM | Admit: 2013-12-22 | Discharge: 2013-12-22 | Disposition: A | Discharge status: Home / Self Care | Payer: No Typology Code available for payment source | Attending: Emergency Medicine | Admitting: Emergency Medicine

## 2013-12-22 ENCOUNTER — Emergency Department (HOSPITAL_COMMUNITY): Payer: No Typology Code available for payment source

## 2013-12-22 ENCOUNTER — Encounter (HOSPITAL_COMMUNITY): Payer: Self-pay | Admitting: Emergency Medicine

## 2013-12-22 DIAGNOSIS — Z792 Long term (current) use of antibiotics: Secondary | ICD-10-CM | POA: Insufficient documentation

## 2013-12-22 DIAGNOSIS — N50811 Right testicular pain: Secondary | ICD-10-CM

## 2013-12-22 DIAGNOSIS — Z79899 Other long term (current) drug therapy: Secondary | ICD-10-CM | POA: Insufficient documentation

## 2013-12-22 DIAGNOSIS — I10 Essential (primary) hypertension: Secondary | ICD-10-CM | POA: Insufficient documentation

## 2013-12-22 DIAGNOSIS — E119 Type 2 diabetes mellitus without complications: Secondary | ICD-10-CM | POA: Insufficient documentation

## 2013-12-22 DIAGNOSIS — N508 Other specified disorders of male genital organs: Secondary | ICD-10-CM | POA: Insufficient documentation

## 2013-12-22 DIAGNOSIS — R52 Pain, unspecified: Secondary | ICD-10-CM

## 2013-12-22 LAB — URINALYSIS, ROUTINE W REFLEX MICROSCOPIC
Bilirubin Urine: NEGATIVE
Glucose, UA: NEGATIVE mg/dL
Hgb urine dipstick: NEGATIVE
Ketones, ur: NEGATIVE mg/dL
Leukocytes, UA: NEGATIVE
Nitrite: NEGATIVE
Protein, ur: NEGATIVE mg/dL
Specific Gravity, Urine: 1.014 (ref 1.005–1.030)
Urobilinogen, UA: 0.2 mg/dL (ref 0.0–1.0)
pH: 5.5 (ref 5.0–8.0)

## 2013-12-22 MED ORDER — HYDROMORPHONE HCL 1 MG/ML IJ SOLN
1.0000 mg | Freq: Once | INTRAMUSCULAR | Status: AC
  Administered 2013-12-22: 1 mg via INTRAMUSCULAR
  Filled 2013-12-22: qty 1

## 2013-12-22 NOTE — ED Notes (Signed)
Pt states that he believes he hurt/injuryed when he was moving earlier. Pt c/o right groin pain and swelling.  Pt denies any problems with urination.

## 2013-12-22 NOTE — Discharge Instructions (Signed)
Testicular Self-Exam  A self-examination of your testicles involves looking at and feeling your testicles for abnormal lumps or swelling. Several things can cause swelling, lumps, or pain in your testicles. Some of these causes are:  · Injuries.  · Inflammation.  · Infection.  · Accumulation of fluids around your testicle (hydrocele).  · Twisted testicles (testicular torsion).  · Testicular cancer.  Self-examination of the testicles and groin areas may be advised if you are at risk for testicular cancer. Risks for testicular cancer include:  · An undescended testicle (cryptorchidism).  · A history of previous testicular cancer.  · A family history of testicular cancer.  The testicles are easiest to examine after warm baths or showers and are more difficult to examine when you are cold. This is because the muscles attached to the testicles retract and pull them up higher or into the abdomen.  Follow these steps while you are standing:  · Hold your penis away from your body.  · Roll one testicle between your thumb and forefinger, feeling the entire testicle.  · Roll the other testicle between your thumb and forefinger, feeling the entire testicle.  Feel for lumps, swelling, or discomfort. A normal testicle is egg shaped and feels firm. It is smooth and not tender. The spermatic cord can be felt as a firm spaghetti-like cord at the back of your testicle. It is also important to examine the crease between the front of your leg and your abdomen. Feel for any bumps that are tender. These could be enlarged lymph nodes.   Document Released: 06/03/2000 Document Revised: 10/28/2012 Document Reviewed: 08/17/2012  [employer]® Patient Information ©2015 [employer]. This information is not intended to replace advice given to you by your health care provider. Make sure you discuss any questions you have with your health care provider.

## 2013-12-22 NOTE — ED Provider Notes (Signed)
CSN: [id number]     Arrival date & time 12/22/13  1707 History   First MD Initiated Contact with Patient 12/22/13 1728     Chief Complaint  Patient presents with  . Groin Pain     (Consider location/radiation/quality/duration/timing/severity/associated sxs/prior Treatment) HPI  58yM with R testicular pain. Acute onset just after ejaculation during intercourse. Persistent since. Deep, throbbing pain. Mild nausea. No discharge. No abdominal pain. No mass/bulge. No urinary complaints. No fever or chills. One other incident with similar pain, but to lesser degree and duration several years ago which resolved spontaneously.   Past Medical History  Diagnosis Date  . Hypertension   . Diabetes mellitus    History reviewed. No pertinent past surgical history. No family history on file. History  Substance Use Topics  . Smoking status: Never Smoker   . Smokeless tobacco: Not on file  . Alcohol Use: No    Review of Systems  All systems reviewed and negative, other than as noted in HPI.   Allergies  Review of patient's allergies indicates no known allergies.  Home Medications   Prior to Admission medications   Medication Sig Start Date End Date Taking? Authorizing Provider  cephALEXin (KEFLEX) 500 MG capsule Take 1 capsule (500 mg total) by mouth 4 (four) times daily. 01/07/13   Montine Circle, PA-C  diltiazem (DILACOR XR) 180 MG 24 hr capsule Take 180 mg by mouth daily.    Historical Provider, MD  glipiZIDE (GLUCOTROL) 10 MG tablet Take 10 mg by mouth 2 (two) times daily before a meal.    Historical Provider, MD  hydrochlorothiazide (HYDRODIURIL) 25 MG tablet Take 25 mg by mouth daily.    Historical Provider, MD  losartan (COZAAR) 50 MG tablet Take 100 mg by mouth daily.    Historical Provider, MD  metoprolol tartrate (LOPRESSOR) 25 MG tablet Take 25 mg by mouth 2 (two) times daily.    Historical Provider, MD  oxyCODONE-acetaminophen (PERCOCET/ROXICET) 5-325 MG per tablet Take 2  tablets by mouth every 6 (six) hours as needed for pain. 01/07/13   Montine Circle, PA-C   BP 126/66  Pulse 84  Temp(Src) 99.2 F (37.3 C) (Oral)  Resp 16  Ht 5\' 11"  (1.803 m)  Wt 252 lb (114.306 kg)  BMI 35.16 kg/m2  SpO2 100% Physical Exam  Nursing note and vitals reviewed. Constitutional: He appears well-developed and well-nourished. No distress.  HENT:  Head: Normocephalic and atraumatic.  Eyes: Conjunctivae are normal. Right eye exhibits no discharge. Left eye exhibits no discharge.  Neck: Neck supple.  Cardiovascular: Normal rate, regular rhythm and normal heart sounds.  Exam reveals no gallop and no friction rub.   No murmur heard. Pulmonary/Chest: Effort normal and breath sounds normal. No respiratory distress.  Abdominal: Soft. He exhibits no distension. There is no tenderness.  Genitourinary:  Normal external male genitalia. No concerning lesion noted. Normal testicular lie. R testicle TTP. No scrotal swelling. Cremasteric reflexes intact. No inguinal adenopathy. No hernia.   Musculoskeletal: He exhibits no edema and no tenderness.  Neurological: He is alert.  Skin: Skin is warm and dry.  Psychiatric: He has a normal mood and affect. His behavior is normal. Thought content normal.    ED Course  Procedures (including critical care time) Labs Review Labs Reviewed - No data to display  Imaging Review No results found.   EKG Interpretation None      MDM   Final diagnoses:  Pain  Testicular pain, right    58yM with R  testicular pain after ejaculation. [place] normal. Blocked ejaculatory duct? Pain currently resolved. Normal UA. Urology FU for further symptoms.     Virgel Manifold, MD 12/30/13 [phone]

## 2013-12-22 NOTE — ED Notes (Addendum)
US bedside

## 2013-12-22 NOTE — ED Notes (Signed)
MD at bedside. 

## 2014-03-14 ENCOUNTER — Encounter (HOSPITAL_COMMUNITY): Payer: Self-pay | Admitting: Emergency Medicine

## 2014-03-14 ENCOUNTER — Emergency Department (HOSPITAL_COMMUNITY)
Admission: EM | Admit: 2014-03-14 | Discharge: 2014-03-14 | Disposition: A | Discharge status: Home / Self Care | Payer: No Typology Code available for payment source | Attending: Emergency Medicine | Admitting: Emergency Medicine

## 2014-03-14 DIAGNOSIS — Y9289 Other specified places as the place of occurrence of the external cause: Secondary | ICD-10-CM | POA: Insufficient documentation

## 2014-03-14 DIAGNOSIS — Y998 Other external cause status: Secondary | ICD-10-CM | POA: Insufficient documentation

## 2014-03-14 DIAGNOSIS — I10 Essential (primary) hypertension: Secondary | ICD-10-CM | POA: Insufficient documentation

## 2014-03-14 DIAGNOSIS — E119 Type 2 diabetes mellitus without complications: Secondary | ICD-10-CM | POA: Insufficient documentation

## 2014-03-14 DIAGNOSIS — Z79899 Other long term (current) drug therapy: Secondary | ICD-10-CM | POA: Insufficient documentation

## 2014-03-14 DIAGNOSIS — S80862A Insect bite (nonvenomous), left lower leg, initial encounter: Secondary | ICD-10-CM | POA: Insufficient documentation

## 2014-03-14 DIAGNOSIS — W57XXXA Bitten or stung by nonvenomous insect and other nonvenomous arthropods, initial encounter: Secondary | ICD-10-CM | POA: Insufficient documentation

## 2014-03-14 DIAGNOSIS — S39012A Strain of muscle, fascia and tendon of lower back, initial encounter: Secondary | ICD-10-CM | POA: Insufficient documentation

## 2014-03-14 DIAGNOSIS — Z7982 Long term (current) use of aspirin: Secondary | ICD-10-CM | POA: Insufficient documentation

## 2014-03-14 DIAGNOSIS — Y9389 Activity, other specified: Secondary | ICD-10-CM | POA: Insufficient documentation

## 2014-03-14 LAB — CBG MONITORING, ED: GLUCOSE-CAPILLARY: 106 mg/dL — AB (ref 70–99)

## 2014-03-14 MED ORDER — OXYCODONE-ACETAMINOPHEN 5-325 MG PO TABS: 1.0000 | ORAL_TABLET | Freq: Three times a day (TID) | ORAL | Status: DC | PRN

## 2014-03-14 MED ORDER — CLINDAMYCIN HCL 150 MG PO CAPS: 300.0000 mg | ORAL_CAPSULE | Freq: Three times a day (TID) | ORAL | Status: DC

## 2014-03-14 MED ORDER — OXYCODONE-ACETAMINOPHEN 5-325 MG PO TABS
1.0000 | ORAL_TABLET | Freq: Once | ORAL | Status: AC
  Administered 2014-03-14: 1 via ORAL
  Filled 2014-03-14: qty 1

## 2014-03-14 NOTE — ED Provider Notes (Signed)
CSN: [id number]     Arrival date & time 03/14/14  1732 History  This chart was scribed for non-physician practitioner, Jamse Mead, PA-C,working with [name], MD, by [name], ED Scribe. This patient was seen in room [place] and the patient's care was started at 6:36 PM.  No chief complaint on file.  The history is provided by the patient. No language interpreter was used.    HPI Comments:  Jon Harper is a 59 y.o. male who presents to the Emergency Department complaining of worsening, severe lower back pain that began 3-4 months ago. He describes the pain as throbbing and non-radiating. He denies taking anything to relieve the pain. Denies modifying factors. Denies any heavy lifting. Denies trauma, fall or injury. Denies fever, chills, numbness, tingling or weakness of any extremity, CP, SOB, nausea, vomiting, abdominal pain, bowel or bladder incontinence, hematuria or dysuria. PMH of HTN and DM.  Pt reports an area of pain to the medial left calf that began two days ago. He reports associated redness. He has applied alcohol to the area with minimal relief. He states the area was darker but has since lightened up to a normal color. He denies knowing if anything has bitten him but states he does have spiders in his house. Touching the area makes the pain worse. Denies alleviating factors. Denies red streaking, fever or chills.   PCP is Dr. Oletta Lamas  Past Medical History  Diagnosis Date  . Hypertension   . Diabetes mellitus    History reviewed. No pertinent past surgical history. History reviewed. No pertinent family history. History  Substance Use Topics  . Smoking status: Never Smoker   . Smokeless tobacco: Not on file  . Alcohol Use: No    Review of Systems  Constitutional: Negative for fever and chills.  Respiratory: Negative for shortness of breath.   Cardiovascular: Negative for chest pain.  Gastrointestinal: Negative for nausea, vomiting and abdominal pain.   Genitourinary: Negative for dysuria and hematuria.       No bowel or bladder incontinence.  Musculoskeletal: Positive for back pain.  Neurological: Negative for weakness and numbness.    Allergies  Review of patient's allergies indicates no known allergies.  Home Medications   Prior to Admission medications   Medication Sig Start Date End Date Taking? Authorizing Provider  aspirin 81 MG tablet Take 81 mg by mouth daily.    Historical Provider, MD  atorvastatin (LIPITOR) 20 MG tablet Take 20 mg by mouth daily.    Historical Provider, MD  clindamycin (CLEOCIN) 150 MG capsule Take 2 capsules (300 mg total) by mouth 3 (three) times daily. 03/14/14   [name], PA-C  diltiazem (DILACOR XR) 180 MG 24 hr capsule Take 180 mg by mouth daily.    Historical Provider, MD  gabapentin (NEURONTIN) 300 MG capsule Take 300 mg by mouth at bedtime as needed (for nerve pain.).    Historical Provider, MD  glipiZIDE (GLUCOTROL) 10 MG tablet Take 10 mg by mouth 2 (two) times daily before a meal.    Historical Provider, MD  hydrochlorothiazide (HYDRODIURIL) 25 MG tablet Take 25 mg by mouth daily.    Historical Provider, MD  Iron-Vitamins (GERITOL PO) Take 1 tablet by mouth daily.    Historical Provider, MD  losartan (COZAAR) 50 MG tablet Take 100 mg by mouth daily.    Historical Provider, MD  metoprolol tartrate (LOPRESSOR) 25 MG tablet Take 25 mg by mouth 2 (two) times daily.    Historical Provider,  MD  oxyCODONE-acetaminophen (PERCOCET/ROXICET) 5-325 MG per tablet Take 1 tablet by mouth every 8 (eight) hours as needed for moderate pain or severe pain. 03/14/14   [name], PA-C   Triage Vitals: BP 144/103 mmHg  Pulse 96  Temp(Src) 97.6 F (36.4 C) (Oral)  Resp 18  SpO2 100% Physical Exam  Constitutional: He is oriented to person, place, and time. He appears well-developed and well-nourished.  HENT:  Head: Normocephalic and atraumatic.  Mouth/Throat: Oropharynx is clear and moist. No  oropharyngeal exudate.  Eyes: Conjunctivae and EOM are normal. Pupils are equal, round, and reactive to light. Right eye exhibits no discharge. Left eye exhibits no discharge.  Neck: Normal range of motion. Neck supple. No tracheal deviation present.  Cardiovascular: Normal rate, regular rhythm and normal heart sounds.  Exam reveals no friction rub.   No murmur heard. Pulses:      Radial pulses are 2+ on the right side, and 2+ on the left side.       Dorsalis pedis pulses are 2+ on the right side, and 2+ on the left side.  Pulmonary/Chest: Effort normal.  Musculoskeletal: Normal range of motion. He exhibits tenderness.       Lumbar back: He exhibits tenderness. He exhibits normal range of motion, no bony tenderness, no swelling, no edema, no deformity and no laceration.       Back:       Legs: Negative deformities identified to the spine. Discomfort upon palpation to the mid lumbosacral and bilateral paravertebral regions. Full ROM to upper and lower extremities without difficulty noted, negative ataxia noted.  Lymphadenopathy:    He has no cervical adenopathy.  Neurological: He is alert and oriented to person, place, and time. No cranial nerve deficit. He exhibits normal muscle tone. Coordination normal.  Cranial nerves III-XII grossly intact Strength 5+/5+ to upper and lower extremities bilaterally with resistance applied, equal distribution noted Equal grip strength Sensation intact with differentiation to sharp and dull touch Negative saddle paresthesias bilaterally Negative arm drift Fine motor skills intact  Skin: Skin is warm and dry.  Mild erythema identified to the middle aspect of the left knee, measuring approximately 3 cm x 3 cm with an underlying raised area in the center-appears to be a bite. Negative warmth upon palpation. Negative red streaks. Negative active drainage or bleeding noted. Negative palpation of induration or fluctuance-negative findings of appreciable abscess  at this time.  Psychiatric: He has a normal mood and affect. His behavior is normal.  Nursing note and vitals reviewed.   ED Course  Procedures (including critical care time) DIAGNOSTIC STUDIES: Oxygen Saturation is 100% on RA, normal by my interpretation.   COORDINATION OF CARE: 6:44 PM- Will prescribe pain medication and antibiotic. Pt verbalizes understanding and agrees to plan.  Medications  oxyCODONE-acetaminophen (PERCOCET/ROXICET) 5-325 MG per tablet 1 tablet (1 tablet Oral Given 03/14/14 1919)    Labs Review Labs Reviewed  CBG MONITORING, ED - Abnormal; Notable for the following:    Glucose-Capillary 106 (*)    All other components within normal limits    Imaging Review No results found.   EKG Interpretation None      MDM   Final diagnoses:  Lumbar strain, initial encounter  Insect bite    Medications  oxyCODONE-acetaminophen (PERCOCET/ROXICET) 5-325 MG per tablet 1 tablet (1 tablet Oral Given 03/14/14 1919)    Filed Vitals:   03/14/14 1739 03/14/14 1927  BP: 144/103 132/88  Pulse: 96 79  Temp: 97.6 F (36.4 C)  TempSrc: Oral   Resp: 18   SpO2: 100%     I personally performed the services described in this documentation, which was scribed in my presence. The recorded information has been reviewed and is accurate.  CBG 106.  Doubt cauda equina. Doubt epidural abscess. Patient has history of back pain, patient reports that his back pain is very similar to the back pain that he's been dealing with. Stated that he's been dealing with it for the past 3-4 months. Suspicion to be musculoskeletal secondary to pain upon palpation and pain with motion. Suspicion to be possible insect bite localized to the erythema with a raised lesion localized to the medial aspect of the left knee. Negative findings of abscess. Negative red streaks or sinus cellulitic infection-will cover patient with antibiotics. Line of demarcation made on the skin and educated patient on what  to watch out for. Patient reported that he ran out of medications - percocets. Patient stable, afebrile. Patient not septic appearing. Discharged patient. Discharge patient with referral to primary care provider and orthopedics. Discharged patient with antibiotics and percocets - discussed course, precautions, disposal technique. Discussed with patient to rest and stay hydrated. Discussed with patient to avoid any physical strenuous activity. Discussed with patient to apply heat and massage. Discussed with patient to closely monitor symptoms and if symptoms are to worsen or change to report back to the ED - strict return instructions given.  Patient agreed to plan of care, understood, all questions answered.   Jamse Mead, PA-C 03/14/14 1948  [name], MD 03/17/14 1323

## 2014-03-14 NOTE — Discharge Instructions (Signed)
Please call your doctor for a followup appointment within 24-48 hours. When you talk to your doctor please let them know that you were seen in the emergency department and have them acquire all of your records so that they can discuss the findings with you and formulate a treatment plan to fully care for your new and ongoing problems. Please call and set-up an appointment with your primary care provider to be seen and assessed Please call and set-up an appointment with Orthopedics regarding back pain that has been ongoing for the past 3-4 months Please take medications as prescribed and on a full stomach  Please take medications as prescribed - while on pain medications there is to be no drinking alcohol, driving, operating any heavy machinery. If extra please dispose in a proper manner. Please do not take any extra Tylenol with this medication for this can lead to Tylenol overdose and liver issues.  Please apply heat and massage with icy hot ointment  Please closely monitor redness localized to the medial aspect of the left knee - if swelling, red streaks, pus drainage comes about please report back to the ED immediately  Please continue to monitor symptoms closely and if symptoms are to worsen or change (fever greater than 101, chills, sweating, nausea, vomiting, chest pain, shortness of breathe, difficulty breathing, weakness, numbness, tingling, worsening or changes to pain pattern, fall, injury, inability to control urine or bowel movements, blood in stools, black tarry stools) please report back to the Emergency Department immediately.    Back Pain, Adult Low back pain is very common. About 1 in 5 people have back pain.The cause of low back pain is rarely dangerous. The pain often gets better over time.About half of people with a sudden onset of back pain feel better in just 2 weeks. About 8 in 10 people feel better by 6 weeks.  CAUSES Some common causes of back pain include:  Strain of the  muscles or ligaments supporting the spine.  Wear and tear (degeneration) of the spinal discs.  Arthritis.  Direct injury to the back. DIAGNOSIS Most of the time, the direct cause of low back pain is not known.However, back pain can be treated effectively even when the exact cause of the pain is unknown.Answering your caregiver's questions about your overall health and symptoms is one of the most accurate ways to make sure the cause of your pain is not dangerous. If your caregiver needs more information, he or she may order lab work or imaging tests (X-rays or MRIs).However, even if imaging tests show changes in your back, this usually does not require surgery. HOME CARE INSTRUCTIONS For many people, back pain returns.Since low back pain is rarely dangerous, it is often a condition that people can learn to [facility] their own.   Remain active. It is stressful on the back to sit or stand in one place. Do not sit, drive, or stand in one place for more than 30 minutes at a time. Take short walks on level surfaces as soon as pain allows.Try to increase the length of time you walk each day.  Do not stay in bed.Resting more than 1 or 2 days can delay your recovery.  Do not avoid exercise or work.Your body is made to move.It is not dangerous to be active, even though your back may hurt.Your back will likely heal faster if you return to being active before your pain is gone.  Pay attention to your body when you bend and lift. Many  people have less discomfortwhen lifting if they bend their knees, keep the load close to their bodies,and avoid twisting. Often, the most comfortable positions are those that put less stress on your recovering back.  Find a comfortable position to sleep. Use a firm mattress and lie on your side with your knees slightly bent. If you lie on your back, put a pillow under your knees.  Only take over-the-counter or prescription medicines as directed by your caregiver.  Over-the-counter medicines to reduce pain and inflammation are often the most helpful.Your caregiver may prescribe muscle relaxant drugs.These medicines help dull your pain so you can more quickly return to your normal activities and healthy exercise.  Put ice on the injured area.  Put ice in a plastic bag.  Place a towel between your skin and the bag.  Leave the ice on for 15-20 minutes, 03-04 times a day for the first 2 to 3 days. After that, ice and heat may be alternated to reduce pain and spasms.  Ask your caregiver about trying back exercises and gentle massage. This may be of some benefit.  Avoid feeling anxious or stressed.Stress increases muscle tension and can worsen back pain.It is important to recognize when you are anxious or stressed and learn ways to manage it.Exercise is a great option. SEEK MEDICAL CARE IF:  You have pain that is not relieved with rest or medicine.  You have pain that does not improve in 1 week.  You have new symptoms.  You are generally not feeling well. SEEK IMMEDIATE MEDICAL CARE IF:   You have pain that radiates from your back into your legs.  You develop new bowel or bladder control problems.  You have unusual weakness or numbness in your arms or legs.  You develop nausea or vomiting.  You develop abdominal pain.  You feel faint. Document Released: 02/25/2005 Document Revised: 08/27/2011 Document Reviewed: 06/29/2013 [facility] Patient Information 2015 [place], [place]. This information is not intended to replace advice given to you by your health care provider. Make sure you discuss any questions you have with your health care provider.   Emergency Department Resource Guide 1) Find a Doctor and Pay Out of Pocket Although you won't have to find out who is covered by your insurance plan, it is a good idea to ask around and get recommendations. You will then need to call the office and see if the doctor you have chosen will accept you as  a new patient and what types of options they offer for patients who are self-pay. Some doctors offer discounts or will set up payment plans for their patients who do not have insurance, but you will need to ask so you aren't surprised when you get to your appointment.  2) Contact Your Local Health Department Not all health departments have doctors that can see patients for sick visits, but many do, so it is worth a call to see if yours does. If you don't know where your local health department is, you can check in your phone book. The CDC also has a tool to help you locate your state's health department, and many state websites also have listings of all of their local health departments.  3) Find a [facility] If your illness is not likely to be very severe or complicated, you may want to try a walk in clinic. These are popping up all over the country in pharmacies, drugstores, and shopping centers. They're usually staffed by nurse practitioners or physician assistants that  have been trained to treat common illnesses and complaints. They're usually fairly quick and inexpensive. However, if you have serious medical issues or chronic medical problems, these are probably not your best option.  No Primary Care Doctor: - Call Health Connect at  [phone] - they can help you locate a primary care doctor that  accepts your insurance, provides certain services, etc. - Physician Referral Service- [phone]  Chronic Pain Problems: Organization         Address  Phone   Notes  [facility]  [phone] Patients need to be referred by their primary care doctor.   Medication Assistance: Organization         Address  Phone   Notes  [facility] Medication [facility] [address] [place], [place] [address] [phone] --Must be a resident of [facility] -- Must have NO insurance coverage whatsoever (no Medicaid/ Medicare, etc.) -- The pt. MUST have a  primary care doctor that directs their care regularly and follows them in the community   MedAssist  [phone]   [employer]  [phone]    Agencies that provide inexpensive medical care: Organization         Address  Phone   Notes  [place]  [phone]   [facility]    [phone]   [facility] [place], [place] [address] [phone]   [place] [address], [place] [phone]   Planned Parenthood    [phone]   [facility]    [phone]   [place] and [address], [place] Phone:  [phone], Fax:  [phone] Hours of Operation:  9 am - 6 pm, M-F.  Also accepts Medicaid/Medicare and self-pay.  [facility] for [place], [address], [place] Phone: [phone], Fax: [phone]. Hours of Operation:  8:30 am - 5:30 pm, M-F.  Also accepts Medicaid and self-pay.  [facility] [address], [place] Phone: [phone]   [place], [place], [place] [phone], Ext. [phone] Mondays & Thursdays: 7-9 AM.  First 15 patients are seen on a first come, first serve basis.    [place] Providers:  Organization         Address  Phone   Notes  [facility] [address], [place] [phone] Also accepts self-pay patients.  [facility] [address] [place], [place]  [phone]   [place], [address], [place] [phone]   [facility] [address] [place] [phone]   Lucianne Lei [address], [place]   [phone] Only accepts [place] Access [place] patients after they have their name applied to their card.   Self-Pay (no insurance) in [facility]:  Organization         Address  Phone   Notes  Sickle Cell Patients, [facility] Internal Medicine [place] [phone]   [facility] [place] [phone]   [facility] Pontes Urgent [place]  [place], [address], [place] [phone]   [facility]/Dr. Osei-Bonsu  [address]  or [place], [address], [place] [phone] Phone number for both [facility] and [place] locations is the same.  [facility] and [facility] [address] [place] [phone]   [facility] [address], [place] or [address] [phone]   [facility] [address] [place] [phone], phone; [phone], fax Sees patients 1st and 3rd Saturday of every month.  Must not qualify for public or private insurance (i.e. Medicaid, Medicare, [place] Health Choice, Veterans' Benefits)  Household income should be no more than 200% of the poverty level The clinic cannot treat you if you are pregnant or think you are pregnant  Sexually transmitted diseases are not treated at the clinic.    Dental Care: Organization         Address  Phone  Notes  [facility] Department of [facility] [place] [phone] Accepts children up to age 68 who are enrolled in [place] or [place]; pregnant women with a Medicaid card; and children who have applied for Medicaid or [place] Health Choice, but were declined, whose parents can pay a reduced fee at time of service.  [facility] Department of [facility]  [address], [place] [phone] Accepts children up to age 59 who are enrolled in [place] or [place]; pregnant women with a Medicaid card; and children who have applied for Medicaid or [place] Health Choice, but were declined, whose parents can  pay a reduced fee at time of service.  [place] Adult Dental Access PROGRAM  [place] [phone] Patients are seen by appointment only. Walk-ins are not accepted. [place] will see patients 55 years of age and older. Monday - Tuesday (8am-5pm) Most Wednesdays (8:30-5pm) $30 per visit, cash only  [facility] Adult Dental Access PROGRAM  [address], [facility] [phone] Patients are seen by appointment only. Walk-ins are not accepted. [place] will see patients 52 years of age and older. One Wednesday Evening (Monthly: Volunteer Based).  $30 per visit, cash only  [place]  [phone] for adults; Children under age 39, call Graduate Pediatric Dentistry at [phone]. Children aged 25-14, please call [phone] to request a pediatric application.  Dental services are provided in all areas of dental care including fillings, crowns and bridges, complete and partial dentures, implants, gum treatment, root canals, and extractions. Preventive care is also provided. Treatment is provided to both adults and children. Patients are selected via a lottery and there is often a waiting list.   [facility] [address] [place]  [phone] [email or web address]   [facility] [address] [place], [place] [phone], Ext. [phone] Second and Fourth Thursday of each month, opens at 6:30 AM; Clinic ends at 9 AM.  Patients are seen on a first-come first-served basis, and a limited number are seen during each clinic.   [facility]  [address] [place] Danker [place], [place] [phone]   Eligibility Requirements You must have lived in [place], [place], or [place] counties for at least the last three months.   You cannot be eligible for state or federal sponsored [employer], including [employer], [place], or [employer].   You generally cannot be eligible for healthcare  insurance through your employer.    How to apply: Eligibility  screenings are held every Tuesday and Wednesday afternoon from 1:00 pm until 4:00 pm. You do not need an appointment for the interview!  [facility] [address], [place], [place]   [place]  [place] Department  [place]  [phone]    Behavioral Health Resources in the Community: Intensive Outpatient Programs Organization         Address  Phone  Notes  [place]. [address] [place], [place] [phone]   [facility] [address], [place], [place]   ADS: Alcohol & Drug Svcs [address], [place], [place]   [place] [address]  [place], [place] or [phone]   Substance Abuse Resources Organization         Address  Phone  Notes  Alcohol and Drug Services  [phone]   [place]  [phone]   [facility] [place]   Chinita Pester  [phone]   Residential & Outpatient Substance Abuse Program  [phone]   Psychological Services Organization         Address  Phone  Notes  [facility] [place]  [place]  [phone]   [place] [address] [place] or [phone]    Mobile Crisis Teams Organization         Address  Phone  Notes  [facility], Mobile Crisis Care Unit  [phone]   [facility]ive Psychotherapeutic Services  [address] [place], [place]   [place] [address] [place] [phone]    Self-Help/Support Groups Organization         Address  Phone             Notes  [place]. of [place] - variety of support groups  [place] Call for more information  Narcotics Anonymous (NA),  [facility] [address], [employer] [place]  2 meetings at this location   Special educational needs teacher         Address  Phone  Notes  [facility]AP Residential Treatment [place],    [place]  [phone]   [facility]  [address], [place] [address], [place], [place]   [place], [place] [phone] Admissions: 8am-3pm M-F  Incentives Substance [place] [address],    [place], [place] [phone]   [facility] [address] [place], [place], [place]   [facility] [address],  [place], [place]   [facility] Programs - Intensive Outpatient [address], [place], [place]   [employer] ([place] Solis.) [address]  [place], [place] [phone] or [phone]   [facility] (RTS) [address], [place], [place] Accepts Medicaid  [facility] [place] [address],  [place] [phone] Substance Abuse/Addiction Treatment   [facility] Organization         Address  Phone  Notes  [facility]  [phone]   Domenic Schwab, PhD [address] [place], [place]   [phone] or [phone]   [place], [place] [phone]   [facility] [place] [address] [place], [place] [phone]  Insurance/Medicaid/sponsorship through [employer] and Families [address], [address] [place], [place] [phone] [place], [place] [phone]    Dr. Adele Schilder  [phone]   [facility] Dept. 1) [address], [place] 2) [place] 3)  [place] [address], [place] [phone]  [phone]    [place] [phone] or [phone] (After Hours)

## 2014-03-14 NOTE — ED Notes (Signed)
Pt c/o ongoing back pain with recent flare-up. Denies bowel or bladder incontinence. Ambulates without significant difficulty.

## 2014-12-02 DIAGNOSIS — Z7982 Long term (current) use of aspirin: Secondary | ICD-10-CM | POA: Insufficient documentation

## 2014-12-02 DIAGNOSIS — M7592 Shoulder lesion, unspecified, left shoulder: Secondary | ICD-10-CM | POA: Insufficient documentation

## 2014-12-02 DIAGNOSIS — Z79899 Other long term (current) drug therapy: Secondary | ICD-10-CM | POA: Insufficient documentation

## 2014-12-02 DIAGNOSIS — I10 Essential (primary) hypertension: Secondary | ICD-10-CM | POA: Insufficient documentation

## 2014-12-02 DIAGNOSIS — E119 Type 2 diabetes mellitus without complications: Secondary | ICD-10-CM | POA: Insufficient documentation

## 2014-12-03 ENCOUNTER — Emergency Department (HOSPITAL_COMMUNITY): Payer: No Typology Code available for payment source

## 2014-12-03 ENCOUNTER — Emergency Department (HOSPITAL_COMMUNITY)
Admission: EM | Admit: 2014-12-03 | Discharge: 2014-12-03 | Disposition: A | Discharge status: Home / Self Care | Payer: No Typology Code available for payment source | Attending: Emergency Medicine | Admitting: Emergency Medicine

## 2014-12-03 ENCOUNTER — Encounter (HOSPITAL_COMMUNITY): Payer: Self-pay | Admitting: *Deleted

## 2014-12-03 DIAGNOSIS — M778 Other enthesopathies, not elsewhere classified: Secondary | ICD-10-CM

## 2014-12-03 DIAGNOSIS — M7582 Other shoulder lesions, left shoulder: Secondary | ICD-10-CM

## 2014-12-03 MED ORDER — CYCLOBENZAPRINE HCL 10 MG PO TABS: 10.0000 mg | ORAL_TABLET | Freq: Two times a day (BID) | ORAL | Status: DC | PRN

## 2014-12-03 MED ORDER — HYDROCODONE-ACETAMINOPHEN 5-325 MG PO TABS: 2.0000 | ORAL_TABLET | ORAL | Status: DC | PRN

## 2014-12-03 MED ORDER — CYCLOBENZAPRINE HCL 10 MG PO TABS
10.0000 mg | ORAL_TABLET | Freq: Once | ORAL | Status: AC
  Administered 2014-12-03: 10 mg via ORAL
  Filled 2014-12-03: qty 1

## 2014-12-03 MED ORDER — NAPROXEN 500 MG PO TABS: 500.0000 mg | ORAL_TABLET | Freq: Two times a day (BID) | ORAL | Status: DC

## 2014-12-03 MED ORDER — HYDROCODONE-ACETAMINOPHEN 5-325 MG PO TABS
2.0000 | ORAL_TABLET | Freq: Once | ORAL | Status: AC
  Administered 2014-12-03: 2 via ORAL
  Filled 2014-12-03: qty 2

## 2014-12-03 MED ORDER — NAPROXEN 500 MG PO TABS
500.0000 mg | ORAL_TABLET | Freq: Once | ORAL | Status: AC
  Administered 2014-12-03: 500 mg via ORAL
  Filled 2014-12-03: qty 1

## 2014-12-03 NOTE — ED Provider Notes (Signed)
CSN: [id number]     Arrival date & time 12/02/14  2356 History   First MD Initiated Contact with Patient 12/03/14 0113     Chief Complaint  Patient presents with  . Shoulder Pain     (Consider location/radiation/quality/duration/timing/severity/associated sxs/prior Treatment) Patient is a 59 y.o. male presenting with shoulder pain. The history is provided by the patient. No language interpreter was used.  Shoulder Pain Location:  Shoulder Time since incident:  1 day Injury: no   Shoulder location:  L shoulder Pain details:    Quality:  Aching   Radiates to:  L arm   Severity:  Severe   Onset quality:  Gradual   Duration:  1 day   Timing:  Constant   Progression:  Unchanged Chronicity:  New Handedness:  Right-handed Dislocation: no   Foreign body present:  No foreign bodies Tetanus status:  Unknown Prior injury to area:  No Relieved by:  Nothing Worsened by:  Movement Ineffective treatments:  Being still Associated symptoms: no back pain, no decreased range of motion, no muscle weakness, no neck pain, no numbness and no tingling   Risk factors: no concern for non-accidental trauma, no known bone disorder, no frequent fractures and no recent illness     Past Medical History  Diagnosis Date  . Hypertension   . Diabetes mellitus    Past Surgical History  Procedure Laterality Date  . Right nephrectomy     No family history on file. Social History  Substance Use Topics  . Smoking status: Never Smoker   . Smokeless tobacco: None  . Alcohol Use: Yes     Comment: occ    Review of Systems  Musculoskeletal: Positive for arthralgias. Negative for back pain and neck pain.  All other systems reviewed and are negative.     Allergies  Review of patient's allergies indicates no known allergies.  Home Medications   Prior to Admission medications   Medication Sig Start Date End Date Taking? Authorizing Provider  aspirin 81 MG tablet Take 81 mg by mouth daily.   Yes  Historical Provider, MD  atorvastatin (LIPITOR) 20 MG tablet Take 20 mg by mouth daily.   Yes Historical Provider, MD  glipiZIDE (GLUCOTROL) 10 MG tablet Take 10 mg by mouth 2 (two) times daily before a meal.   Yes Historical Provider, MD  Iron-Vitamins (GERITOL PO) Take 1 tablet by mouth daily.   Yes Historical Provider, MD  clindamycin (CLEOCIN) 150 MG capsule Take 2 capsules (300 mg total) by mouth 3 (three) times daily. Patient not taking: Reported on 12/03/2014 03/14/14   [name], PA-C  diltiazem (DILACOR XR) 180 MG 24 hr capsule Take 180 mg by mouth daily.    Historical Provider, MD  gabapentin (NEURONTIN) 300 MG capsule Take 300 mg by mouth at bedtime as needed (for nerve pain.).    Historical Provider, MD  hydrochlorothiazide (HYDRODIURIL) 25 MG tablet Take 25 mg by mouth daily.    Historical Provider, MD  losartan (COZAAR) 50 MG tablet Take 100 mg by mouth daily.    Historical Provider, MD  metoprolol tartrate (LOPRESSOR) 25 MG tablet Take 25 mg by mouth 2 (two) times daily.    Historical Provider, MD  oxyCODONE-acetaminophen (PERCOCET/ROXICET) 5-325 MG per tablet Take 1 tablet by mouth every 8 (eight) hours as needed for moderate pain or severe pain. Patient not taking: Reported on 12/03/2014 03/14/14   [name], PA-C   BP 150/93 mmHg  Pulse 90  Temp(Src) 99 F (37.2  C) (Oral)  Resp 20  SpO2 100% Physical Exam  Constitutional: He is oriented to person, place, and time. He appears well-developed and well-nourished. No distress.  HENT:  Head: Normocephalic and atraumatic.  Eyes: Conjunctivae and EOM are normal.  Neck: Normal range of motion.  Cardiovascular: Normal rate and regular rhythm.  Exam reveals no gallop and no friction rub.   No murmur heard. Pulmonary/Chest: Effort normal and breath sounds normal. He has no wheezes. He has no rales. He exhibits no tenderness.  Abdominal: Soft. He exhibits no distension. There is no tenderness. There is no rebound.   Musculoskeletal:  Left shoulder generalized tenderness to palpation. Limited ROM of left shoulder due to pain. No obvious deformity.   Neurological: He is alert and oriented to person, place, and time. Coordination normal.  Speech is goal-oriented. Moves limbs without ataxia.   Skin: Skin is warm and dry.  Psychiatric: He has a normal mood and affect. His behavior is normal.  Nursing note and vitals reviewed.   ED Course  Procedures (including critical care time)  SPLINT APPLICATION Date/Time: 9:38 AM Authorized by: [name] Consent: Verbal consent obtained. Risks and benefits: risks, benefits and alternatives were discussed Consent given by: patient Splint applied by: orthopedic technician Location details: left shoulder Splint type: sling Supplies used: sling Post-procedure: The splinted body part was neurovascularly unchanged following the procedure. Patient tolerance: Patient tolerated the procedure well with no immediate complications.     Labs Review Labs Reviewed - No data to display  Imaging Review Dg Shoulder Left  12/03/2014   CLINICAL DATA:  Left shoulder pain after trimming trees. No specific injury.  EXAM: LEFT SHOULDER - 2+ VIEW  COMPARISON:  04/29/2005  FINDINGS: Calcific fragment over the greater tuberosity of the left shoulder appears old and may represent old ununited ossicle, old fracture fragment, or calcific tendinitis. This was not present at the previous study no evidence of acute fracture or dislocation of the left shoulder. Soft tissues are otherwise unremarkable.  IMPRESSION: Old ununited ossicle over the greater tuberosity of the left shoulder may represent old fracture fragment, or calcific tendinitis. No acute fractures identified.   Electronically Signed   By: Lucienne Capers M.D.   On: 12/03/2014 01:08   I have personally reviewed and evaluated these images and lab results as part of my medical decision-making.   EKG  Interpretation None      MDM   Final diagnoses:  Left shoulder tendinitis    1:25 AM Xray unremarkable for acute changes. Patient likely has tendinitis. Patient will have vicodin, naprosyn, and flexeril. Patient advised to rest and ice the area. Vitals stable and patient afebrile. No other injury.    [name], PA-C 12/03/14 1017  Virgel Manifold, MD 12/05/14 [phone]

## 2014-12-03 NOTE — Discharge Instructions (Signed)
Take Naprosyn and Vicodin as needed for pain. Take flexeril as needed for muscle spasm. You may take these medications together. Refer to attached documents for more information.

## 2014-12-03 NOTE — ED Notes (Signed)
Pt states that he was trimming the trees and now has pain to left shoulder; pt denies injury; pt c/o pain upon movement and pain in the joint when he's not using it; pt denies numbness or tingling

## 2015-12-21 ENCOUNTER — Emergency Department (HOSPITAL_COMMUNITY): Payer: Self-pay

## 2015-12-21 ENCOUNTER — Encounter (HOSPITAL_COMMUNITY): Payer: Self-pay | Admitting: *Deleted

## 2015-12-21 ENCOUNTER — Emergency Department (HOSPITAL_COMMUNITY)
Admission: EM | Admit: 2015-12-21 | Discharge: 2015-12-21 | Disposition: A | Discharge status: Home / Self Care | Payer: Self-pay | Attending: Emergency Medicine | Admitting: Emergency Medicine

## 2015-12-21 DIAGNOSIS — Y999 Unspecified external cause status: Secondary | ICD-10-CM | POA: Insufficient documentation

## 2015-12-21 DIAGNOSIS — Z79899 Other long term (current) drug therapy: Secondary | ICD-10-CM | POA: Insufficient documentation

## 2015-12-21 DIAGNOSIS — Y939 Activity, unspecified: Secondary | ICD-10-CM | POA: Insufficient documentation

## 2015-12-21 DIAGNOSIS — E119 Type 2 diabetes mellitus without complications: Secondary | ICD-10-CM | POA: Insufficient documentation

## 2015-12-21 DIAGNOSIS — X500XXA Overexertion from strenuous movement or load, initial encounter: Secondary | ICD-10-CM | POA: Insufficient documentation

## 2015-12-21 DIAGNOSIS — Z7984 Long term (current) use of oral hypoglycemic drugs: Secondary | ICD-10-CM | POA: Insufficient documentation

## 2015-12-21 DIAGNOSIS — I10 Essential (primary) hypertension: Secondary | ICD-10-CM | POA: Insufficient documentation

## 2015-12-21 DIAGNOSIS — Z7982 Long term (current) use of aspirin: Secondary | ICD-10-CM | POA: Insufficient documentation

## 2015-12-21 DIAGNOSIS — E875 Hyperkalemia: Secondary | ICD-10-CM | POA: Insufficient documentation

## 2015-12-21 DIAGNOSIS — Y929 Unspecified place or not applicable: Secondary | ICD-10-CM | POA: Insufficient documentation

## 2015-12-21 DIAGNOSIS — N179 Acute kidney failure, unspecified: Secondary | ICD-10-CM | POA: Insufficient documentation

## 2015-12-21 DIAGNOSIS — M5441 Lumbago with sciatica, right side: Secondary | ICD-10-CM | POA: Insufficient documentation

## 2015-12-21 LAB — I-STAT CHEM 8, ED
BUN: 40 mg/dL — AB (ref 6–20)
CALCIUM ION: 1.18 mmol/L (ref 1.15–1.40)
Chloride: 105 mmol/L (ref 101–111)
Creatinine, Ser: 1.7 mg/dL — ABNORMAL HIGH (ref 0.61–1.24)
Glucose, Bld: 103 mg/dL — ABNORMAL HIGH (ref 65–99)
HEMATOCRIT: 41 % (ref 39.0–52.0)
HEMOGLOBIN: 13.9 g/dL (ref 13.0–17.0)
Potassium: 5.7 mmol/L — ABNORMAL HIGH (ref 3.5–5.1)
SODIUM: 140 mmol/L (ref 135–145)
TCO2: 29 mmol/L (ref 0–100)

## 2015-12-21 LAB — URINALYSIS, ROUTINE W REFLEX MICROSCOPIC
BILIRUBIN URINE: NEGATIVE
Glucose, UA: NEGATIVE mg/dL
Hgb urine dipstick: NEGATIVE
KETONES UR: NEGATIVE mg/dL
LEUKOCYTES UA: NEGATIVE
NITRITE: NEGATIVE
PH: 7 (ref 5.0–8.0)
PROTEIN: NEGATIVE mg/dL
Specific Gravity, Urine: 1.013 (ref 1.005–1.030)

## 2015-12-21 MED ORDER — SODIUM CHLORIDE 0.9 % IV BOLUS (SEPSIS)
1000.0000 mL | Freq: Once | INTRAVENOUS | Status: AC
  Administered 2015-12-21: 1000 mL via INTRAVENOUS

## 2015-12-21 MED ORDER — ONDANSETRON HCL 4 MG/2ML IJ SOLN
4.0000 mg | Freq: Once | INTRAMUSCULAR | Status: AC
  Administered 2015-12-21: 4 mg via INTRAVENOUS
  Filled 2015-12-21: qty 2

## 2015-12-21 MED ORDER — HYDROMORPHONE HCL 1 MG/ML IJ SOLN
1.0000 mg | Freq: Once | INTRAMUSCULAR | Status: AC
  Administered 2015-12-21: 1 mg via INTRAVENOUS
  Filled 2015-12-21: qty 1

## 2015-12-21 MED ORDER — METHOCARBAMOL 500 MG PO TABS: 500.0000 mg | ORAL_TABLET | Freq: Two times a day (BID) | ORAL | 0 refills | Status: DC

## 2015-12-21 MED ORDER — OXYCODONE-ACETAMINOPHEN 5-325 MG PO TABS: 1.0000 | ORAL_TABLET | Freq: Four times a day (QID) | ORAL | 0 refills | Status: DC | PRN

## 2015-12-21 MED ORDER — MORPHINE SULFATE (PF) 4 MG/ML IV SOLN
4.0000 mg | Freq: Once | INTRAVENOUS | Status: AC
  Administered 2015-12-21: 4 mg via INTRAVENOUS
  Filled 2015-12-21: qty 1

## 2015-12-21 NOTE — ED Notes (Signed)
Patient transported to X-ray 

## 2015-12-21 NOTE — ED Provider Notes (Signed)
[facility] DEPT Provider Note   CSN: [id number] Arrival date & time: 12/21/15  1336  By signing my name below, I, Soijett Blue, attest that this documentation has been prepared under the direction and in the presence of Jadakiss Barish Y. Bronsen Serano, PA-C Electronically Signed: Soijett Blue, ED Scribe. 12/21/15. 2:32 PM.   History   Chief Complaint Chief Complaint  Patient presents with  . Back Pain    HPI  Jon Harper is a 60 y.o. male with a PMHx of DM, HTN, renal cell carcinoma s/p right nephrectomy who presents to the Emergency Department complaining of lower back pain onset 5 days. Pt states that he completed some heavy lifting this past weekend with lower back pain following afterwards. When the pt was asked what brought him into the ED and his history, pt initially refused to answer questions stating that he already informed the nurse of what brought him in. When the pt was asked again for a history by the PA, the pt raised his voice and yelled "You don't know the pain i'm in, I just need some pain medicine" multiple times. Pt notes that he has a hx of right renal CA with his right kidney surgically removed in its entirety. STates he has back problems. When the pt was asked what specialists that he sees he stated "I see all the specialists." When asked to clarify if he has seen ortho, he again states "I see all the specialists." He states that he is having associated symptoms of right leg pain, right foot pain, and gait problem due to pain. He states that he has not tried any medications for the relief of his symptoms. Pt denies difficulty urinating, fever, chills, and any other symptoms.     The history is provided by the patient. No language interpreter was used.    Past Medical History:  Diagnosis Date  . Diabetes mellitus   . Hypertension     There are no active problems to display for this patient.   Past Surgical History:  Procedure Laterality Date  . right nephrectomy          Home Medications    Prior to Admission medications   Medication Sig Start Date End Date Taking? Authorizing Provider  aspirin 81 MG tablet Take 81 mg by mouth daily.    Historical Provider, MD  atorvastatin (LIPITOR) 20 MG tablet Take 20 mg by mouth daily.    Historical Provider, MD  clindamycin (CLEOCIN) 150 MG capsule Take 2 capsules (300 mg total) by mouth 3 (three) times daily. Patient not taking: Reported on 12/03/2014 03/14/14   [name], PA-C  cyclobenzaprine (FLEXERIL) 10 MG tablet Take 1 tablet (10 mg total) by mouth 2 (two) times daily as needed for muscle spasms. 12/03/14   [name], PA-C  diltiazem (DILACOR XR) 180 MG 24 hr capsule Take 180 mg by mouth daily.    Historical Provider, MD  gabapentin (NEURONTIN) 300 MG capsule Take 300 mg by mouth at bedtime as needed (for nerve pain.).    Historical Provider, MD  glipiZIDE (GLUCOTROL) 10 MG tablet Take 10 mg by mouth 2 (two) times daily before a meal.    Historical Provider, MD  hydrochlorothiazide (HYDRODIURIL) 25 MG tablet Take 25 mg by mouth daily.    Historical Provider, MD  HYDROcodone-acetaminophen (NORCO/VICODIN) 5-325 MG per tablet Take 2 tablets by mouth every 4 (four) hours as needed. 12/03/14   [name], PA-C  Iron-Vitamins (GERITOL PO) Take 1 tablet by mouth daily.  Historical Provider, MD  losartan (COZAAR) 50 MG tablet Take 100 mg by mouth daily.    Historical Provider, MD  metoprolol tartrate (LOPRESSOR) 25 MG tablet Take 25 mg by mouth 2 (two) times daily.    Historical Provider, MD  naproxen (NAPROSYN) 500 MG tablet Take 1 tablet (500 mg total) by mouth 2 (two) times daily with a meal. 12/03/14   [name], PA-C  oxyCODONE-acetaminophen (PERCOCET/ROXICET) 5-325 MG per tablet Take 1 tablet by mouth every 8 (eight) hours as needed for moderate pain or severe pain. Patient not taking: Reported on 12/03/2014 03/14/14   Jamse Mead, PA-C    Family History No family history on  file.  Social History Social History  Substance Use Topics  . Smoking status: Never Smoker  . Smokeless tobacco: Never Used  . Alcohol use Yes     Comment: occ     Allergies   Review of patient's allergies indicates no known allergies.   Review of Systems Review of Systems  Constitutional: Negative for chills and fever.  Genitourinary: Negative for difficulty urinating.  Musculoskeletal: Positive for arthralgias (right leg and right foot), back pain (lower) and gait problem (due to pain).  All other systems reviewed and are negative.    Physical Exam Updated Vital Signs BP 130/92 (BP Location: Right Arm)   Pulse 91   Temp 98.1 F (36.7 C) (Oral)   Resp 18   Ht 5\' 11"  (1.803 m)   Wt 250 lb (113.4 kg)   SpO2 100%   BMI 34.87 kg/m   Physical Exam  Constitutional: He is oriented to person, place, and time. He appears well-developed and well-nourished. No distress.  HENT:  Head: Normocephalic and atraumatic.  Eyes: EOM are normal.  Neck: Neck supple.  Cardiovascular: Normal rate.   Pulmonary/Chest: Effort normal. No respiratory distress.  Abdominal: He exhibits no distension.  Musculoskeletal: Normal range of motion.       Right hip: He exhibits tenderness.       Lumbar back: He exhibits tenderness, bony tenderness and spasm.  Midline lumbar spinal tenderness and tenderness extending to right hip with associated spasm. Difficulty to assess strength and ROM due to pt demeanor.   Neurological: He is alert and oriented to person, place, and time.  Skin: Skin is warm and dry.  Psychiatric: He has a normal mood and affect. His behavior is normal.  Nursing note and vitals reviewed.    ED Treatments / Results  DIAGNOSTIC STUDIES: Oxygen Saturation is 100% on RA, nl by my interpretation.    COORDINATION OF CARE: 2:28 PM Discussed treatment plan with pt at bedside which includes labs, lumbar spine xray, morphine injection, zofran injection, and pt agreed to  plan.   2:36 PM- Upon xray tech arriving to take the pt to xray, he states "No, I need pain medicine before I can do anything."   Labs (all labs ordered are listed, but only abnormal results are displayed) Labs Reviewed  I-STAT CHEM 8, ED - Abnormal; Notable for the following:       Result Value   Potassium 5.7 (*)    BUN 40 (*)    Creatinine, Ser 1.70 (*)    Glucose, Bld 103 (*)    All other components within normal limits  URINALYSIS, ROUTINE W REFLEX MICROSCOPIC (NOT AT [facility])    Radiology Dg Lumbar Spine Complete  Result Date: 12/21/2015 CLINICAL DATA:  Back pain.  Injured lifting heavy object 5 days ago. EXAM: LUMBAR SPINE - COMPLETE  4+ VIEW COMPARISON:  06/25/2012 FINDINGS: Normal wall and stable alignment of the lumbar vertebral bodies. Disc spaces and vertebral bodies are maintained. Stable moderate facet degenerative changes in the lower lumbar spine but no pars defects. Slightly progressive degenerative changes at T11-12. Mild bilateral SI joint degenerative changes are noted. IMPRESSION: Normal alignment and no acute bony findings. Electronically Signed   By: Marijo Sanes M.D.   On: 12/21/2015 15:43    Procedures Procedures (including critical care time)  Medications Ordered in ED Medications  morphine 4 MG/ML injection 4 mg (4 mg Intravenous Given 12/21/15 1437)  ondansetron (ZOFRAN) injection 4 mg (4 mg Intravenous Given 12/21/15 1437)  sodium chloride 0.9 % bolus 1,000 mL (1,000 mLs Intravenous New Bag/Given 12/21/15 1534)  HYDROmorphone (DILAUDID) injection 1 mg (1 mg Intravenous Given 12/21/15 1549)     Initial Impression / Assessment and Plan / ED Course  I have reviewed the triage vital signs and the nursing notes.  Pertinent labs & imaging results that were available during my care of the patient were reviewed by me and considered in my medical decision making (see chart for details).  Clinical Course    4:39 PM  I stat chem 8 with hyperkalemia of  5.7, AKI with Cr 1.7 and BUN 40. Last renal function at [facility] in June of this year was 1.4/18. Pt does admit he has not been drinking as much water as he should as he has not been feeling well at home with his back injury. Discussed case with attending Dr. Myrene Buddy. Will check UA, r/o hematuria. Will hydrate here. EKG in NSR without peaked t-waves or widened QRS. Will hold off on repeat BMP or CK for now. Doubt rhabdo.  5:09 PM Pain resolved with pain medication. UA negative. Will d/c home with course of pain meds. Suspect musculoskeletal strain. Likely some AKI from dehydration. Reminded pt to drink plenty of water at home. He has a PCP f/u appt next week scheduled already. Instructed to repeat BMP. ER return precautions given.  Final Clinical Impressions(s) / ED Diagnoses   Final diagnoses:  Acute right-sided low back pain with right-sided sciatica  AKI (acute kidney injury) ([place])  Hyperkalemia    New Prescriptions New Prescriptions   METHOCARBAMOL (ROBAXIN) 500 MG TABLET    Take 1 tablet (500 mg total) by mouth 2 (two) times daily.   OXYCODONE-ACETAMINOPHEN (PERCOCET/ROXICET) 5-325 MG TABLET    Take 1-2 tablets by mouth every 6 (six) hours as needed for severe pain.    I personally performed the services described in this documentation, which was scribed in my presence. The recorded information has been reviewed and is accurate.    [name], PA-C 12/21/15 1711    Duffy Bruce, MD 12/21/15 [phone]

## 2015-12-21 NOTE — ED Triage Notes (Addendum)
Patient presents with low back pain which began this past weekend.  Patient also c/o pain in feet.  Patient had right renal cancer and has had kidney removed last year.  Patient states this past weekend he did some heavy lifting and shortly afterwards the back pain began.  Patient has not treated pain at home with any medications.  Patient states pain is worse with movement.  Patient denies numbness/tingling in low back as well as urinary/fecal incontinence. Patient denies fever and N/V/D.

## 2015-12-21 NOTE — Discharge Instructions (Signed)
Take pain medication as prescribed as needed for pain. Please follow up with your primary care provider as soon as possible. They will need to re-check your bloodwork and monitor your kidney function and potassium level. Your x-ray today was normal. Your urine test was normal. Return to the ER for new or worsening symptoms.

## 2016-03-18 ENCOUNTER — Ambulatory Visit (HOSPITAL_COMMUNITY)
Admission: EM | Admit: 2016-03-18 | Discharge: 2016-03-18 | Disposition: A | Discharge status: Home / Self Care | Payer: No Typology Code available for payment source

## 2016-03-20 ENCOUNTER — Ambulatory Visit (HOSPITAL_COMMUNITY)
Admission: EM | Admit: 2016-03-20 | Discharge: 2016-03-20 | Disposition: A | Discharge status: Other Acute Inpatient Hospital | Payer: No Typology Code available for payment source

## 2016-05-22 ENCOUNTER — Encounter (HOSPITAL_COMMUNITY): Payer: Self-pay

## 2016-05-22 ENCOUNTER — Emergency Department (HOSPITAL_COMMUNITY)
Admission: EM | Admit: 2016-05-22 | Discharge: 2016-05-22 | Disposition: A | Discharge status: Home / Self Care | Payer: Self-pay | Attending: Emergency Medicine | Admitting: Emergency Medicine

## 2016-05-22 ENCOUNTER — Emergency Department (HOSPITAL_COMMUNITY): Payer: Self-pay

## 2016-05-22 DIAGNOSIS — Y929 Unspecified place or not applicable: Secondary | ICD-10-CM | POA: Insufficient documentation

## 2016-05-22 DIAGNOSIS — Z79899 Other long term (current) drug therapy: Secondary | ICD-10-CM | POA: Insufficient documentation

## 2016-05-22 DIAGNOSIS — Z7982 Long term (current) use of aspirin: Secondary | ICD-10-CM | POA: Insufficient documentation

## 2016-05-22 DIAGNOSIS — X58XXXA Exposure to other specified factors, initial encounter: Secondary | ICD-10-CM | POA: Insufficient documentation

## 2016-05-22 DIAGNOSIS — E119 Type 2 diabetes mellitus without complications: Secondary | ICD-10-CM | POA: Insufficient documentation

## 2016-05-22 DIAGNOSIS — Y999 Unspecified external cause status: Secondary | ICD-10-CM | POA: Insufficient documentation

## 2016-05-22 DIAGNOSIS — I10 Essential (primary) hypertension: Secondary | ICD-10-CM | POA: Insufficient documentation

## 2016-05-22 DIAGNOSIS — Y939 Activity, unspecified: Secondary | ICD-10-CM | POA: Insufficient documentation

## 2016-05-22 DIAGNOSIS — S93492A Sprain of other ligament of left ankle, initial encounter: Secondary | ICD-10-CM | POA: Insufficient documentation

## 2016-05-22 MED ORDER — OXYCODONE-ACETAMINOPHEN 5-325 MG PO TABS
1.0000 | ORAL_TABLET | ORAL | Status: DC | PRN
Start: 2016-05-22 — End: 2016-05-22
  Administered 2016-05-22: 1 via ORAL
  Filled 2016-05-22: qty 1

## 2016-05-22 MED ORDER — OXYCODONE HCL 5 MG PO TABS
5.0000 mg | ORAL_TABLET | Freq: Once | ORAL | Status: AC
  Administered 2016-05-22: 5 mg via ORAL
  Filled 2016-05-22: qty 1

## 2016-05-22 MED ORDER — IBUPROFEN 800 MG PO TABS
800.0000 mg | ORAL_TABLET | Freq: Once | ORAL | Status: AC
  Administered 2016-05-22: 800 mg via ORAL
  Filled 2016-05-22: qty 1

## 2016-05-22 MED ORDER — ACETAMINOPHEN 325 MG PO TABS
650.0000 mg | ORAL_TABLET | Freq: Once | ORAL | Status: AC
  Administered 2016-05-22: 650 mg via ORAL
  Filled 2016-05-22: qty 2

## 2016-05-22 NOTE — Discharge Instructions (Signed)
Take 4 over the counter ibuprofen tablets 3 times a day or 2 over-the-counter naproxen tablets twice a day for pain. Also take tylenol 1000mg(2 extra strength) four times a day.    

## 2016-05-22 NOTE — ED Notes (Signed)
ED Provider at bedside. 

## 2016-05-22 NOTE — ED Provider Notes (Signed)
[facility] DEPT Provider Note   CSN: [id number] Arrival date & time: 05/22/16  1147     History   Chief Complaint Chief Complaint  Patient presents with  . Foot Pain    HPI Jon Harper is a 61 y.o. male.  61 yo M with a chief complaint of left ankle pain. This been going on for the past couple days. Initially started with his foot and now he feels it spread to his ankle. States it is hurting pretty bad that he continue to walk on it and thinks that maybe caused injury. Denies any injury mechanism. Denies fevers denies chills denies erythema. Denies history of gout.   The history is provided by the patient.  Ankle Pain   The incident occurred 2 days ago. The incident occurred at home. There was no injury mechanism. The pain is present in the left ankle. The quality of the pain is described as sharp and throbbing. The pain is at a severity of 8/10. The pain is severe. The pain has been constant since onset. Associated symptoms include inability to bear weight. Pertinent negatives include no numbness, no loss of motion, no muscle weakness and no loss of sensation. He reports no foreign bodies present. Nothing aggravates the symptoms. He has tried nothing for the symptoms. The treatment provided no relief.    Past Medical History:  Diagnosis Date  . Diabetes mellitus   . Hypertension     There are no active problems to display for this patient.   Past Surgical History:  Procedure Laterality Date  . right nephrectomy         Home Medications    Prior to Admission medications   Medication Sig Start Date End Date Taking? Authorizing Provider  aspirin 81 MG tablet Take 81 mg by mouth daily.    Historical Provider, MD  atorvastatin (LIPITOR) 20 MG tablet Take 20 mg by mouth daily.    Historical Provider, MD  clindamycin (CLEOCIN) 150 MG capsule Take 2 capsules (300 mg total) by mouth 3 (three) times daily. Patient not taking: Reported on 12/03/2014 03/14/14   Jon  Sciacca, PA-C  cyclobenzaprine (FLEXERIL) 10 MG tablet Take 1 tablet (10 mg total) by mouth 2 (two) times daily as needed for muscle spasms. 12/03/14   Jon Szekalski, PA-C  diltiazem (DILACOR XR) 180 MG 24 hr capsule Take 180 mg by mouth daily.    Historical Provider, MD  gabapentin (NEURONTIN) 300 MG capsule Take 300 mg by mouth at bedtime as needed (for nerve pain.).    Historical Provider, MD  glipiZIDE (GLUCOTROL) 10 MG tablet Take 10 mg by mouth 2 (two) times daily before a meal.    Historical Provider, MD  hydrochlorothiazide (HYDRODIURIL) 25 MG tablet Take 25 mg by mouth daily.    Historical Provider, MD  HYDROcodone-acetaminophen (NORCO/VICODIN) 5-325 MG per tablet Take 2 tablets by mouth every 4 (four) hours as needed. 12/03/14   Jon Szekalski, PA-C  Iron-Vitamins (GERITOL PO) Take 1 tablet by mouth daily.    Historical Provider, MD  losartan (COZAAR) 50 MG tablet Take 100 mg by mouth daily.    Historical Provider, MD  methocarbamol (ROBAXIN) 500 MG tablet Take 1 tablet (500 mg total) by mouth 2 (two) times daily. 12/21/15   Jon Canter Sam, PA-C  metoprolol tartrate (LOPRESSOR) 25 MG tablet Take 25 mg by mouth 2 (two) times daily.    Historical Provider, MD  naproxen (NAPROSYN) 500 MG tablet Take 1 tablet (500 mg total) by  mouth 2 (two) times daily with a meal. 12/03/14   Jon Chou, PA-C  oxyCODONE-acetaminophen (PERCOCET/ROXICET) 5-325 MG per tablet Take 1 tablet by mouth every 8 (eight) hours as needed for moderate pain or severe pain. Patient not taking: Reported on 12/03/2014 03/14/14   Jon Sciacca, PA-C  oxyCODONE-acetaminophen (PERCOCET/ROXICET) 5-325 MG tablet Take 1-2 tablets by mouth every 6 (six) hours as needed for severe pain. 12/21/15   Jon Ng, PA-C    Family History History reviewed. No pertinent family history.  Social History Social History  Substance Use Topics  . Smoking status: Never Smoker  . Smokeless tobacco: Never Used  . Alcohol use Yes      Comment: occ     Allergies   Patient has no known allergies.   Review of Systems Review of Systems  Constitutional: Negative for chills and fever.  HENT: Negative for congestion and facial swelling.   Eyes: Negative for discharge and visual disturbance.  Respiratory: Negative for shortness of breath.   Cardiovascular: Negative for chest pain and palpitations.  Gastrointestinal: Negative for abdominal pain, diarrhea and vomiting.  Musculoskeletal: Positive for arthralgias, gait problem, joint swelling and myalgias.  Skin: Negative for color change and rash.  Neurological: Negative for tremors, syncope, numbness and headaches.  Psychiatric/Behavioral: Negative for confusion and dysphoric mood.     Physical Exam Updated Vital Signs BP 121/89 (BP Location: Left Arm)   Pulse 80   Temp 99 F (37.2 C) (Oral)   Resp 18   SpO2 100%   Physical Exam  Constitutional: He is oriented to person, place, and time. He appears well-developed and well-nourished.  HENT:  Head: Normocephalic and atraumatic.  Eyes: EOM are normal. Pupils are equal, round, and reactive to light.  Neck: Normal range of motion. Neck supple. No JVD present.  Cardiovascular: Normal rate and regular rhythm.  Exam reveals no gallop and no friction rub.   No murmur heard. Pulmonary/Chest: No respiratory distress. He has no wheezes.  Abdominal: He exhibits no distension and no mass. There is no tenderness. There is no rebound and no guarding.  Musculoskeletal: Normal range of motion. He exhibits edema and tenderness.  Marked edema, no noted erythema, diffuse pain about the ankle.  PMS intact distally.  Mild pain to the bottom of the foot.   Neurological: He is alert and oriented to person, place, and time.  Skin: No rash noted. No pallor.  Psychiatric: He has a normal mood and affect. His behavior is normal.  Nursing note and vitals reviewed.    ED Treatments / Results  Labs (all labs ordered are listed, but  only abnormal results are displayed) Labs Reviewed - No data to display  EKG  EKG Interpretation None       Radiology Dg Ankle Complete Left  Result Date: 05/22/2016 CLINICAL DATA:  Left ankle pain for 3 days.  No reported injury. EXAM: LEFT ANKLE COMPLETE - 3+ VIEW COMPARISON:  None. FINDINGS: No fracture or subluxation. No suspicious focal osseous lesion. 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 No appreciable erosive arthropathy. Soft tissue swelling in the medial left ankle with well corticated osseous fragments adjacent to the tip of the medial malleolus. Small Achilles and tiny plantar left calcaneal spurs. Vascular calcifications in the soft tissues. IMPRESSION: 1. No acute fracture or subluxation. 2. 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 3. Soft tissue swelling in the medial left ankle. Well corticated osseous fragments adjacent to the tip of the medial malleolus, favor the  sequela of remote slight chronic avulsion injury. 4. Small Achilles and tiny plantar calcaneal spurs. Electronically Signed   By: Ilona Sorrel M.D.   On: 05/22/2016 15:24    Procedures Procedures (including critical care time)  Medications Ordered in ED Medications  oxyCODONE-acetaminophen (PERCOCET/ROXICET) 5-325 MG per tablet 1 tablet (1 tablet Oral Given 05/22/16 1206)  ibuprofen (ADVIL,MOTRIN) tablet 800 mg (800 mg Oral Given 05/22/16 1425)  acetaminophen (TYLENOL) tablet 650 mg (650 mg Oral Given 05/22/16 1425)  oxyCODONE (Oxy IR/ROXICODONE) immediate release tablet 5 mg (5 mg Oral Given 05/22/16 1425)     Initial Impression / Assessment and Plan / ED Course  I have reviewed the triage vital signs and the nursing notes.  Pertinent labs & imaging results that were available during my care of the patient were reviewed by me and considered in my medical decision making (see chart for details).     61 yo M With a chief complaint of left ankle pain. This was nontraumatic presentation.  There is marked swelling on my exam. No erythema or warmth. I doubt that this is septic arthritis. Will obtain a plain film. Symptomatic therapy. Patient is requesting narcotics for this I told him that if there is no fracture then I would not send him home for the possible complications of narcotic therapy.  3:40 PM:  I have discussed the diagnosis/risks/treatment options with the patient and family and believe the pt to be eligible for discharge home to follow-up with PCP. We also discussed returning to the ED immediately if new or worsening sx occur. We discussed the sx which are most concerning (e.g., sudden worsening pain, fever, inability to tolerate by mouth) that necessitate immediate return. Medications administered to the patient during their visit and any new prescriptions provided to the patient are listed below.  Medications given during this visit Medications  oxyCODONE-acetaminophen (PERCOCET/ROXICET) 5-325 MG per tablet 1 tablet (1 tablet Oral Given 05/22/16 1206)  ibuprofen (ADVIL,MOTRIN) tablet 800 mg (800 mg Oral Given 05/22/16 1425)  acetaminophen (TYLENOL) tablet 650 mg (650 mg Oral Given 05/22/16 1425)  oxyCODONE (Oxy IR/ROXICODONE) immediate release tablet 5 mg (5 mg Oral Given 05/22/16 1425)     The patient appears reasonably screen and/or stabilized for discharge and I doubt any other medical condition or other [facility] requiring further screening, evaluation, or treatment in the ED at this time prior to discharge.     Final Clinical Impressions(s) / ED Diagnoses   Final diagnoses:  Sprain of anterior talofibular ligament of left ankle, initial encounter    New Prescriptions New Prescriptions   No medications on file     Deno Etienne, DO 05/22/16 1540

## 2016-05-22 NOTE — ED Triage Notes (Signed)
Pt with foot pain starting 3 days.  Pt has had in past but not at severe.  Did not seek treatment.  Pt states some swelling and more so on left side.  No injury.

## 2016-06-14 ENCOUNTER — Encounter (HOSPITAL_COMMUNITY): Payer: Self-pay | Admitting: Emergency Medicine

## 2016-06-14 ENCOUNTER — Emergency Department (HOSPITAL_COMMUNITY)
Admission: EM | Admit: 2016-06-14 | Discharge: 2016-06-14 | Disposition: A | Discharge status: Home / Self Care | Payer: Worker's Compensation | Attending: Emergency Medicine | Admitting: Emergency Medicine

## 2016-06-14 ENCOUNTER — Emergency Department (HOSPITAL_COMMUNITY): Payer: Worker's Compensation

## 2016-06-14 DIAGNOSIS — I10 Essential (primary) hypertension: Secondary | ICD-10-CM | POA: Insufficient documentation

## 2016-06-14 DIAGNOSIS — Z7984 Long term (current) use of oral hypoglycemic drugs: Secondary | ICD-10-CM | POA: Diagnosis not present

## 2016-06-14 DIAGNOSIS — Y9289 Other specified places as the place of occurrence of the external cause: Secondary | ICD-10-CM | POA: Insufficient documentation

## 2016-06-14 DIAGNOSIS — Z7982 Long term (current) use of aspirin: Secondary | ICD-10-CM | POA: Diagnosis not present

## 2016-06-14 DIAGNOSIS — X509XXA Other and unspecified overexertion or strenuous movements or postures, initial encounter: Secondary | ICD-10-CM | POA: Diagnosis not present

## 2016-06-14 DIAGNOSIS — Y9389 Activity, other specified: Secondary | ICD-10-CM | POA: Diagnosis not present

## 2016-06-14 DIAGNOSIS — Y999 Unspecified external cause status: Secondary | ICD-10-CM | POA: Diagnosis not present

## 2016-06-14 DIAGNOSIS — Z79899 Other long term (current) drug therapy: Secondary | ICD-10-CM | POA: Diagnosis not present

## 2016-06-14 DIAGNOSIS — S93402A Sprain of unspecified ligament of left ankle, initial encounter: Secondary | ICD-10-CM | POA: Diagnosis not present

## 2016-06-14 DIAGNOSIS — E119 Type 2 diabetes mellitus without complications: Secondary | ICD-10-CM | POA: Diagnosis not present

## 2016-06-14 DIAGNOSIS — S99912A Unspecified injury of left ankle, initial encounter: Secondary | ICD-10-CM | POA: Diagnosis present

## 2016-06-14 MED ORDER — HYDROCODONE-ACETAMINOPHEN 5-325 MG PO TABS
1.0000 | ORAL_TABLET | Freq: Once | ORAL | Status: AC
  Administered 2016-06-14: 1 via ORAL
  Filled 2016-06-14: qty 1

## 2016-06-14 MED ORDER — IBUPROFEN 200 MG PO TABS
600.0000 mg | ORAL_TABLET | Freq: Once | ORAL | Status: AC
  Administered 2016-06-14: 600 mg via ORAL
  Filled 2016-06-14: qty 3

## 2016-06-14 NOTE — ED Triage Notes (Signed)
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 Patient states that it happened around 12 am. Patient tried to work but foot got so painful he couldn't stand it anymore.

## 2016-06-14 NOTE — ED Notes (Signed)
Pt stated he twisted his ankle at work tonight around 00:00. Pt c/o 10/10 pain to the left ankle with noticeable swelling.

## 2016-06-14 NOTE — ED Provider Notes (Signed)
[facility] DEPT Provider Note   CSN: [id number] Arrival date & time: 06/14/16  0343     History   Chief Complaint Chief Complaint  Patient presents with  . Ankle Injury    HPI Jon Harper is a 61 y.o. male.  The history is provided by the patient and medical records. No language interpreter was used.  Ankle Injury    Jon Harper is a 61 y.o. 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 Patient states that his ankle twisted and he has not been able to walk without a limp since. He endorses associated swelling. Pain worse with movement and palpation. No medications were given prior to arrival for his symptoms. No numbness or tingling. No open wounds.   Past Medical History:  Diagnosis Date  . Diabetes mellitus   . Hypertension     There are no active problems to display for this patient.   Past Surgical History:  Procedure Laterality Date  . right nephrectomy         Home Medications    Prior to Admission medications   Medication Sig Start Date End Date Taking? Authorizing Provider  aspirin 81 MG tablet Take 81 mg by mouth daily.    Historical Provider, MD  atorvastatin (LIPITOR) 20 MG tablet Take 20 mg by mouth daily.    Historical Provider, MD  clindamycin (CLEOCIN) 150 MG capsule Take 2 capsules (300 mg total) by mouth 3 (three) times daily. Patient not taking: Reported on 12/03/2014 03/14/14   [name], PA-C  cyclobenzaprine (FLEXERIL) 10 MG tablet Take 1 tablet (10 mg total) by mouth 2 (two) times daily as needed for muscle spasms. 12/03/14   [name], PA-C  diltiazem (DILACOR XR) 180 MG 24 hr capsule Take 180 mg by mouth daily.    Historical Provider, MD  gabapentin (NEURONTIN) 300 MG capsule Take 300 mg by mouth at bedtime as needed (for nerve pain.).    Historical Provider, MD  glipiZIDE (GLUCOTROL) 10 MG  tablet Take 10 mg by mouth 2 (two) times daily before a meal.    Historical Provider, MD  hydrochlorothiazide (HYDRODIURIL) 25 MG tablet Take 25 mg by mouth daily.    Historical Provider, MD  HYDROcodone-acetaminophen (NORCO/VICODIN) 5-325 MG per tablet Take 2 tablets by mouth every 4 (four) hours as needed. 12/03/14   [name], PA-C  Iron-Vitamins (GERITOL PO) Take 1 tablet by mouth daily.    Historical Provider, MD  losartan (COZAAR) 50 MG tablet Take 100 mg by mouth daily.    Historical Provider, MD  methocarbamol (ROBAXIN) 500 MG tablet Take 1 tablet (500 mg total) by mouth 2 (two) times daily. 12/21/15   [name], PA-C  metoprolol tartrate (LOPRESSOR) 25 MG tablet Take 25 mg by mouth 2 (two) times daily.    Historical Provider, MD  naproxen (NAPROSYN) 500 MG tablet Take 1 tablet (500 mg total) by mouth 2 (two) times daily with a meal. 12/03/14   [name], PA-C  oxyCODONE-acetaminophen (PERCOCET/ROXICET) 5-325 MG per tablet Take 1 tablet by mouth every 8 (eight) hours as needed for moderate pain or severe pain. Patient not taking: Reported on 12/03/2014 03/14/14   [name], PA-C  oxyCODONE-acetaminophen (PERCOCET/ROXICET) 5-325 MG tablet Take 1-2 tablets by mouth every 6 (six) hours as needed for severe pain. 12/21/15   [name], PA-C  Family History History reviewed. No pertinent family history.  Social History Social History  Substance Use Topics  . Smoking status: Never Smoker  . Smokeless tobacco: Never Used  . Alcohol use Yes     Comment: occ     Allergies   Patient has no known allergies.   Review of Systems Review of Systems  Musculoskeletal: Positive for arthralgias and joint swelling.  Neurological: Negative for weakness and numbness.     Physical Exam Updated Vital Signs BP 116/82 (BP Location: Left Arm)   Pulse (!) 111   Temp 98.1 F (36.7 C) (Oral)   Resp 18   Ht 5\' 11"  (1.803 m)   Wt 111.1 kg   SpO2 97%   BMI 34.17 kg/m    Physical Exam  Constitutional: He appears well-developed and well-nourished. No distress.  HENT:  Head: Normocephalic and atraumatic.  Neck: Neck supple.  Cardiovascular: Normal rate, regular rhythm and normal heart sounds.   No murmur heard. Pulmonary/Chest: Effort normal and breath sounds normal. No respiratory distress. He has no wheezes. He has no rales.  Musculoskeletal:  Left ankle with full ROM. No erythema, ecchymosis, or deformity appreciated. No swelling. TTP of lateral malleolus and surrounding area but no TTP or swelling of fore foot or calf. No break in skin. No warmth. Achilles intact. Good pedal pulse and cap refill of all toes. Wiggling toes without difficulty. Sensation grossly intact.  Neurological: He is alert.  Skin: Skin is warm and dry.  Nursing note and vitals reviewed.    ED Treatments / Results  Labs (all labs ordered are listed, but only abnormal results are displayed) Labs Reviewed - No data to display  EKG  EKG Interpretation None       Radiology Dg Ankle Complete Left  Result Date: 06/14/2016 CLINICAL DATA:  61 y/o M; twisting injury to the ankle with anterolateral ankle pain. EXAM: LEFT ANKLE COMPLETE - 3+ VIEW COMPARISON:  None. FINDINGS: No acute fracture or dislocation identified. Stable small plantar and dorsal calcaneal enthesophytes. Stable well corticated osseous fragments at the medial malleolar tip compatible with sequelae of old avulsion injury. Stable sclerotic changes in the medial talar dome may represent old osteochondral injury. Vascular calcifications. IMPRESSION: 1. No acute fracture or dislocation identified. 2. Stable old avulsion injury medial malleolus and osteochondral injury of the medial talar dome. 3. Small dorsal and plantar calcaneal enthesophytes. Electronically Signed   By: Kristine Garbe M.D.   On: 06/14/2016 05:10    Procedures Procedures (including critical care time)  Medications Ordered in  ED Medications  HYDROcodone-acetaminophen (NORCO/VICODIN) 5-325 MG per tablet 1 tablet (not administered)  ibuprofen (ADVIL,MOTRIN) tablet 600 mg (600 mg Oral Given 06/14/16 0503)     Initial Impression / Assessment and Plan / ED Course  I have reviewed the triage vital signs and the nursing notes.  Pertinent labs & imaging results that were available during my care of the patient were reviewed by me and considered in my medical decision making (see chart for details).      Jon Harper is a 61 y.o. male who presents to ED for left ankle pain after twisting his ankle around midnight today. On exam, left lower extremity is neurovascularly intact with no obvious deformity or open wounds. X-ray reviewed and negative. Symptomatic home care instructions discussed. ASO brace and crutches provided. Orthopedic follow-up if no improvement in one week. All questions answered.   Final Clinical Impressions(s) / ED Diagnoses   Final diagnoses:  Sprain  of left ankle, unspecified ligament, initial encounter    New Prescriptions New Prescriptions   No medications on file       [facility] Shatha Hooser, PA-C 06/14/16 [place], MD 06/14/16 [phone]

## 2016-06-14 NOTE — Discharge Instructions (Signed)
Alternate between Tylenol and ibuprofen as needed for pain. Ice and alleviate for additional pain relief. If your symptoms persist without improvement in one week, please follow up with the orthopedist listed.    TREATMENT  Rest, ice, elevation, and compression are the basic modes of treatment.    HOME CARE INSTRUCTIONS  Apply ice to the sore area for 15 to 20 minutes, 3 to 4 times per day. Do this while you are awake for the first 2 days. This can be stopped when the swelling goes away. Put the ice in a plastic bag and place a towel between the bag of ice and your skin.  Keep your leg elevated when possible to lessen swelling.  You may take off your ankle stabilizer at night and to take a shower or bath. Wiggle your toes several times per day if you are able.  ACTIVITY:            - Weight bearing as tolerated - if you can do it, do it. If it hurts, stop.             - Exercises should be limited to pain free range of motion            - Can start mobilization by tracing the alphabet with your foot in the air.       SEEK MEDICAL CARE IF:  You have an increase in bruising, swelling, or pain.  Your toes feel cold.  Pain relief is not achieved with medications.  EMERGENCY:: Your toes are numb or blue or you have severe pain.   MAKE SURE YOU:  Understand these instructions.  Will watch your condition.  Will get help right away if you are not doing well or get worse

## 2016-08-12 ENCOUNTER — Ambulatory Visit: Payer: Self-pay

## 2016-09-02 ENCOUNTER — Ambulatory Visit: Payer: Self-pay | Attending: Podiatry | Admitting: Podiatry

## 2016-09-02 DIAGNOSIS — B351 Tinea unguium: Secondary | ICD-10-CM

## 2016-09-02 NOTE — Progress Notes (Signed)
Subjective:    Patient ID: Jon Harper, male   DOB: 61 y.o.   MRN: [id number]   HPI Mr. Ledford presents to the office today for concerns of thickening to the right hallux toenail. He states his been ongoing for several years. Denies any drainage or pus or any swelling redness. He states that is painful mostly in shoes. He has no other concerns today. He's had no recent treatment for this.   Review of Systems  All other systems reviewed and are negative.       Objective:  Physical Exam General: AAO x3, NAD  Dermatological: The right hallux toenails is significant hypertrophic, dystrophic with yellow to brown discoloration. There is tenderness the nail there is no swelling or redness or drainage any clinical signs of infection present. There is no edema. No lesions identified.   Vascular: DP/PT pulses 2/4, CRT less than 3 seconds. There is no pain with calf compression, swelling, warmth, erythema.   Neruologic: Grossly intact via light touch bilateral.   Musculoskeletal: No gross boney pedal deformities bilateral. No pain, crepitus, or limitation noted with foot and ankle range of motion bilateral. Muscular strength 5/5 in all groups tested bilateral.  Gait: Unassisted, Nonantalgic.      Assessment:     61 year old male right hallux onychodystrophy/onychomycosis     Plan:     -Treatment options discussed including all alternatives, risks, and complications -Etiology of symptoms were discussed -I discussed nail removal today. Discussed the procedure. Postoperative course. He wishes to hold off on that today. I sharply debrided the nail or a couple occasions or bleeding. After debridement there was resolution patient symptoms. In the future since continue discussed nail avulsion but likely need have a permanent nail avulsion as the nail mostly grow back in the same way. He understands this.   Celesta Gentile, DPM

## 2016-09-24 MED FILL — GABAPENTIN 300 MG CAPSULE: 300 | 30 days supply | Qty: 120 | Fill #0

## 2016-09-24 MED FILL — glipiZIDE 10 MG TABS: 10 | 30 days supply | Qty: 60 | Fill #0

## 2016-09-24 MED FILL — ?METOPROLOL 25 MG TABLET: 25 | 30 days supply | Qty: 60 | Fill #0

## 2016-09-24 MED FILL — ATORVASTATIN 20 MG TABLET: 20 | 30 days supply | Qty: 30 | Fill #0

## 2016-09-24 MED FILL — LOSARTAN-HCTZ 100-25 MG TAB: 100-25 | 30 days supply | Qty: 30 | Fill #0

## 2016-09-28 DIAGNOSIS — X58XXXA Exposure to other specified factors, initial encounter: Secondary | ICD-10-CM | POA: Insufficient documentation

## 2016-09-28 DIAGNOSIS — S93402D Sprain of unspecified ligament of left ankle, subsequent encounter: Secondary | ICD-10-CM | POA: Insufficient documentation

## 2016-09-29 ENCOUNTER — Emergency Department (HOSPITAL_COMMUNITY)
Admission: EM | Admit: 2016-09-29 | Discharge: 2016-09-29 | Disposition: A | Discharge status: Home / Self Care | Payer: No Typology Code available for payment source | Attending: Emergency Medicine | Admitting: Emergency Medicine

## 2016-09-29 ENCOUNTER — Encounter (HOSPITAL_COMMUNITY): Payer: Self-pay | Admitting: Emergency Medicine

## 2016-09-29 DIAGNOSIS — S93409D Sprain of unspecified ligament of unspecified ankle, subsequent encounter: Secondary | ICD-10-CM

## 2016-09-29 MED ORDER — KETOROLAC TROMETHAMINE 60 MG/2ML IM SOLN
60.0000 mg | Freq: Once | INTRAMUSCULAR | Status: AC
  Administered 2016-09-29: 60 mg via INTRAMUSCULAR
  Filled 2016-09-29: qty 2

## 2016-09-29 MED ORDER — DIAZEPAM 5 MG PO TABS
5.0000 mg | ORAL_TABLET | Freq: Once | ORAL | Status: AC
  Administered 2016-09-29: 5 mg via ORAL
  Filled 2016-09-29: qty 1

## 2016-09-29 MED ORDER — OXYCODONE HCL 5 MG PO TABS
5.0000 mg | ORAL_TABLET | Freq: Once | ORAL | Status: AC
  Administered 2016-09-29: 5 mg via ORAL
  Filled 2016-09-29: qty 1

## 2016-09-29 MED ORDER — ACETAMINOPHEN 500 MG PO TABS
1000.0000 mg | ORAL_TABLET | Freq: Once | ORAL | Status: AC
  Administered 2016-09-29: 1000 mg via ORAL
  Filled 2016-09-29: qty 2

## 2016-09-29 NOTE — ED Notes (Signed)
Bed: WA03 Expected date:  Expected time:  Means of arrival:  Comments: 

## 2016-09-29 NOTE — ED Provider Notes (Signed)
[name] DEPT Provider Note   CSN: [id number] Arrival date & time: 09/28/16  2354  By signing my name below, I, Dora Sims, attest that this documentation has been prepared under the direction and in the presence of physician practitioner, Deno Etienne, DO. Electronically Signed: Dora Sims, Scribe. 09/29/2016. 1:17 AM.  History   Chief Complaint Chief Complaint  Patient presents with  . Foot Pain   The history is provided by the patient. No language interpreter was used.  Foot Pain  This is a new problem. The current episode started more than 1 week ago. The problem occurs daily. The problem has been gradually worsening. Pertinent negatives include no chest pain, no abdominal pain, no headaches and no shortness of breath. The symptoms are aggravated by standing and walking. Nothing relieves the symptoms. He has tried rest for the symptoms. The treatment provided no relief.    HPI Comments: Jon Harper is a 61 y.o. male with PMHx of DM and HTN who presents to the Emergency Department complaining of intermittent, gradually worsening, severe pain in the bilateral ankles and feet for several months. Patient describes the pain as throbbing. 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 No recent re-injury. The pain is severely exacerbated with palpation. He has been evaluated here twice over the last few months for the same and was diagnosed with a sprain of the left ankle. Patient was subsequently given a referral to orthopedics but has not followed-up. He was also given crutches which he has used as needed. Patient denies numbness/tingling, focal weakness, bruising, wounds, or any other associated symptoms.  Past Medical History:  Diagnosis Date  . Diabetes mellitus   . Hypertension     There are no active problems to display for this patient.   Past Surgical History:  Procedure Laterality Date  .  right nephrectomy         Home Medications    Prior to Admission medications   Medication Sig Start Date End Date Taking? Authorizing Provider  aspirin 81 MG tablet Take 81 mg by mouth daily.    [provider]  atorvastatin (LIPITOR) 20 MG tablet Take 20 mg by mouth daily.    [provider]  clindamycin (CLEOCIN) 150 MG capsule Take 2 capsules (300 mg total) by mouth 3 (three) times daily. Patient not taking: Reported on 12/03/2014 03/14/14   Sciacca, Mable Fill, PA-C  cyclobenzaprine (FLEXERIL) 10 MG tablet Take 1 tablet (10 mg total) by mouth 2 (two) times daily as needed for muscle spasms. 12/03/14   Szekalski, Kaitlyn, PA-C  diltiazem (DILACOR XR) 180 MG 24 hr capsule Take 180 mg by mouth daily.    [provider]  gabapentin (NEURONTIN) 300 MG capsule Take 300 mg by mouth at bedtime as needed (for nerve pain.).    [provider]  glipiZIDE (GLUCOTROL) 10 MG tablet Take 10 mg by mouth 2 (two) times daily before a meal.    [provider]  hydrochlorothiazide (HYDRODIURIL) 25 MG tablet Take 25 mg by mouth daily.    [provider]  HYDROcodone-acetaminophen (NORCO/VICODIN) 5-325 MG per tablet Take 2 tablets by mouth every 4 (four) hours as needed. 12/03/14   [name], PA-C  Iron-Vitamins (GERITOL PO) Take 1 tablet by mouth daily.    [provider]  losartan (COZAAR) 50 MG tablet Take 100 mg by mouth daily.    [provider]  methocarbamol (  ROBAXIN) 500 MG tablet Take 1 tablet (500 mg total) by mouth 2 (two) times daily. 12/21/15   Sam, Olivia Canter, PA-C  metoprolol tartrate (LOPRESSOR) 25 MG tablet Take 25 mg by mouth 2 (two) times daily.    [provider]  naproxen (NAPROSYN) 500 MG tablet Take 1 tablet (500 mg total) by mouth 2 (two) times daily with a meal. 12/03/14   Szekalski, Kaitlyn, PA-C  oxyCODONE-acetaminophen (PERCOCET/ROXICET) 5-325 MG per tablet Take 1 tablet by mouth every 8 (eight) hours as  needed for moderate pain or severe pain. Patient not taking: Reported on 12/03/2014 03/14/14   Sciacca, Mable Fill, PA-C  oxyCODONE-acetaminophen (PERCOCET/ROXICET) 5-325 MG tablet Take 1-2 tablets by mouth every 6 (six) hours as needed for severe pain. 12/21/15   [name], PA-C    Family History History reviewed. No pertinent family history.  Social History Social History  Substance Use Topics  . Smoking status: Never Smoker  . Smokeless tobacco: Never Used  . Alcohol use Yes     Comment: occ     Allergies   Patient has no known allergies.   Review of Systems Review of Systems  Respiratory: Negative for shortness of breath.   Cardiovascular: Negative for chest pain.  Gastrointestinal: Negative for abdominal pain.  Musculoskeletal: Positive for arthralgias and gait problem.  Skin: Negative for color change and wound.  Neurological: Negative for weakness, numbness and headaches.   Physical Exam Updated Vital Signs BP 136/88 (BP Location: Right Arm)   Pulse 94   Temp 99.1 F (37.3 C) (Oral)   Resp 14   Ht 5\' 11"  (1.803 m)   Wt 111.1 kg (245 lb)   SpO2 94%   BMI 34.17 kg/m   Physical Exam  Constitutional: He is oriented to person, place, and time. He appears well-developed and well-nourished. No distress.  HENT:  Head: Normocephalic and atraumatic.  Eyes: Conjunctivae and EOM are normal.  Neck: Neck supple. No tracheal deviation present.  Cardiovascular: Normal rate.   Pulmonary/Chest: Effort normal. No respiratory distress.  Musculoskeletal: Normal range of motion. He exhibits tenderness.  Pain all throughout feet and ankles bilaterally without focality. No edema or warmth.  Neurological: He is alert and oriented to person, place, and time.  Skin: Skin is warm and dry.  Psychiatric: He has a normal mood and affect. His behavior is normal.  Nursing note and vitals reviewed.  ED Treatments / Results  Labs (all labs ordered are listed, but only abnormal results  are displayed) Labs Reviewed - No data to display  EKG  EKG Interpretation None       Radiology No results found.  Procedures Procedures (including critical care time)  DIAGNOSTIC STUDIES: Oxygen Saturation is 100% on RA, normal by my interpretation.    COORDINATION OF CARE: 1:12 AM Discussed treatment plan with pt at bedside and pt agreed to plan.  Medications Ordered in ED Medications  ketorolac (TORADOL) injection 60 mg (60 mg Intramuscular Given 09/29/16 0131)  acetaminophen (TYLENOL) tablet 1,000 mg (1,000 mg Oral Given 09/29/16 0131)  oxyCODONE (Oxy IR/ROXICODONE) immediate release tablet 5 mg (5 mg Oral Given 09/29/16 0130)  diazepam (VALIUM) tablet 5 mg (5 mg Oral Given 09/29/16 0131)     Initial Impression / Assessment and Plan / ED Course  I have reviewed the triage vital signs and the nursing notes.  Pertinent labs & imaging results that were available during my care of the patient were reviewed by me and considered in my medical decision  making (see chart for details).     61 yo M With a chief complaints of bilateral foot and ankle pain. This been going on for the past couple months. Worse with movement palpation standing. On exam the patient is exquisitely tender all over both ankles to the point where he most falls out of the bed. He denies recent trauma. He's been seen in the ED for this previously and had imaging that was negative. I'm unsure of the cause of his symptoms. We'll treat him symptomatically here. Tylenol and ibuprofen at home. He is given orthopedic follow-up.  2:07 AM:  I have discussed the diagnosis/risks/treatment options with the patient and family and believe the pt to be eligible for discharge home to follow-up with PCP. We also discussed returning to the ED immediately if new or worsening sx occur. We discussed the sx which are most concerning (e.g., sudden worsening pain, fever, inability to tolerate by mouth) that necessitate immediate return.  Medications administered to the patient during their visit and any new prescriptions provided to the patient are listed below.  Medications given during this visit Medications  ketorolac (TORADOL) injection 60 mg (60 mg Intramuscular Given 09/29/16 0131)  acetaminophen (TYLENOL) tablet 1,000 mg (1,000 mg Oral Given 09/29/16 0131)  oxyCODONE (Oxy IR/ROXICODONE) immediate release tablet 5 mg (5 mg Oral Given 09/29/16 0130)  diazepam (VALIUM) tablet 5 mg (5 mg Oral Given 09/29/16 0131)     The patient appears reasonably screen and/or stabilized for discharge and I doubt any other medical condition or other [facility] requiring further screening, evaluation, or treatment in the ED at this time prior to discharge.    Final Clinical Impressions(s) / ED Diagnoses   Final diagnoses:  Sprain of ankle, unspecified laterality, unspecified ligament, subsequent encounter    New Prescriptions Discharge Medication List as of 09/29/2016  1:16 AM      I personally performed the services described in this documentation, which was scribed in my presence. The recorded information has been reviewed and is accurate.    Deno Etienne, DO 09/29/16 0207

## 2016-09-29 NOTE — ED Triage Notes (Addendum)
Pt reports having bilateral foot pain for the last several months. Pt reports a throbbing pain. Pt reports to having injury at work several months ago and has had pain since then worse with standing.

## 2016-11-22 ENCOUNTER — Emergency Department (HOSPITAL_COMMUNITY)
Admission: EM | Admit: 2016-11-22 | Discharge: 2016-11-22 | Disposition: A | Discharge status: Home / Self Care | Payer: No Typology Code available for payment source | Attending: Emergency Medicine | Admitting: Emergency Medicine

## 2016-11-22 ENCOUNTER — Emergency Department (HOSPITAL_COMMUNITY): Payer: No Typology Code available for payment source

## 2016-11-22 DIAGNOSIS — Y9389 Activity, other specified: Secondary | ICD-10-CM | POA: Insufficient documentation

## 2016-11-22 DIAGNOSIS — Y929 Unspecified place or not applicable: Secondary | ICD-10-CM | POA: Insufficient documentation

## 2016-11-22 DIAGNOSIS — Z7982 Long term (current) use of aspirin: Secondary | ICD-10-CM | POA: Insufficient documentation

## 2016-11-22 DIAGNOSIS — I10 Essential (primary) hypertension: Secondary | ICD-10-CM | POA: Diagnosis not present

## 2016-11-22 DIAGNOSIS — Z79899 Other long term (current) drug therapy: Secondary | ICD-10-CM | POA: Diagnosis not present

## 2016-11-22 DIAGNOSIS — S161XXA Strain of muscle, fascia and tendon at neck level, initial encounter: Secondary | ICD-10-CM | POA: Insufficient documentation

## 2016-11-22 DIAGNOSIS — S39012A Strain of muscle, fascia and tendon of lower back, initial encounter: Secondary | ICD-10-CM | POA: Diagnosis not present

## 2016-11-22 DIAGNOSIS — Z7984 Long term (current) use of oral hypoglycemic drugs: Secondary | ICD-10-CM | POA: Insufficient documentation

## 2016-11-22 DIAGNOSIS — Y999 Unspecified external cause status: Secondary | ICD-10-CM | POA: Insufficient documentation

## 2016-11-22 DIAGNOSIS — E119 Type 2 diabetes mellitus without complications: Secondary | ICD-10-CM | POA: Insufficient documentation

## 2016-11-22 DIAGNOSIS — S199XXA Unspecified injury of neck, initial encounter: Secondary | ICD-10-CM | POA: Diagnosis present

## 2016-11-22 MED ORDER — DIAZEPAM 5 MG PO TABS
5.0000 mg | ORAL_TABLET | Freq: Once | ORAL | Status: AC
  Administered 2016-11-22: 5 mg via ORAL
  Filled 2016-11-22: qty 1

## 2016-11-22 MED ORDER — ONDANSETRON HCL 4 MG/2ML IJ SOLN
4.0000 mg | Freq: Once | INTRAMUSCULAR | Status: AC
  Administered 2016-11-22: 4 mg via INTRAVENOUS
  Filled 2016-11-22: qty 2

## 2016-11-22 MED ORDER — IBUPROFEN 800 MG PO TABS: 800.0000 mg | ORAL_TABLET | Freq: Three times a day (TID) | ORAL | 0 refills | Status: DC

## 2016-11-22 MED ORDER — HYDROCODONE-ACETAMINOPHEN 5-325 MG PO TABS: 1.0000 | ORAL_TABLET | ORAL | 0 refills | Status: DC | PRN

## 2016-11-22 MED ORDER — DIAZEPAM 5 MG PO TABS: 5.0000 mg | ORAL_TABLET | Freq: Two times a day (BID) | ORAL | 0 refills | Status: DC

## 2016-11-22 MED ORDER — MORPHINE SULFATE (PF) 4 MG/ML IV SOLN
4.0000 mg | Freq: Once | INTRAVENOUS | Status: AC
Start: 2016-11-22 — End: 2016-11-22
  Administered 2016-11-22: 4 mg via INTRAVENOUS
  Filled 2016-11-22: qty 1

## 2016-11-22 MED ORDER — KETOROLAC TROMETHAMINE 30 MG/ML IJ SOLN
30.0000 mg | Freq: Once | INTRAMUSCULAR | Status: AC
  Administered 2016-11-22: 30 mg via INTRAVENOUS
  Filled 2016-11-22: qty 1
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
## 2016-12-02 MED FILL — LOSARTAN-HCTZ 100-25 MG TAB: 100-25 | 30 days supply | Qty: 30 | Fill #1

## 2016-12-02 MED FILL — glipiZIDE 10 MG TABS: 10 | 30 days supply | Qty: 60 | Fill #1

## 2016-12-02 MED FILL — GABAPENTIN 300 MG CAPSULE: 300 | 30 days supply | Qty: 120 | Fill #1

## 2016-12-02 MED FILL — ?METOPROLOL 25 MG TABLET: 25 | 30 days supply | Qty: 60 | Fill #1

## 2016-12-02 MED FILL — ATORVASTATIN 20 MG TABLET: 20 | 30 days supply | Qty: 30 | Fill #1

## 2017-01-13 MED FILL — glipiZIDE 10 MG TABS: 10 | 30 days supply | Qty: 60 | Fill #2

## 2017-01-13 MED FILL — LOSARTAN-HCTZ 100-25 MG TAB: 100-25 | 30 days supply | Qty: 30 | Fill #2

## 2017-01-13 MED FILL — ?ATORVASTATIN 20 MG TABLET: 20 | 30 days supply | Qty: 30 | Fill #2

## 2017-01-13 MED FILL — ?METOPROLOL 25 MG TABLET: 25 | 30 days supply | Qty: 60 | Fill #2

## 2017-01-27 ENCOUNTER — Encounter (HOSPITAL_COMMUNITY): Payer: Self-pay | Admitting: *Deleted

## 2017-01-27 ENCOUNTER — Emergency Department (HOSPITAL_COMMUNITY)
Admission: EM | Admit: 2017-01-27 | Discharge: 2017-01-28 | Disposition: A | Discharge status: Home / Self Care | Payer: Self-pay | Attending: Emergency Medicine | Admitting: Emergency Medicine

## 2017-01-27 ENCOUNTER — Emergency Department (HOSPITAL_COMMUNITY): Payer: Self-pay

## 2017-01-27 DIAGNOSIS — G8929 Other chronic pain: Secondary | ICD-10-CM | POA: Insufficient documentation

## 2017-01-27 DIAGNOSIS — M545 Low back pain, unspecified: Secondary | ICD-10-CM

## 2017-01-27 DIAGNOSIS — R200 Anesthesia of skin: Secondary | ICD-10-CM | POA: Insufficient documentation

## 2017-01-27 DIAGNOSIS — M79671 Pain in right foot: Secondary | ICD-10-CM | POA: Insufficient documentation

## 2017-01-27 DIAGNOSIS — M79672 Pain in left foot: Secondary | ICD-10-CM | POA: Insufficient documentation

## 2017-01-27 DIAGNOSIS — Z7982 Long term (current) use of aspirin: Secondary | ICD-10-CM | POA: Insufficient documentation

## 2017-01-27 DIAGNOSIS — E119 Type 2 diabetes mellitus without complications: Secondary | ICD-10-CM | POA: Insufficient documentation

## 2017-01-27 DIAGNOSIS — I10 Essential (primary) hypertension: Secondary | ICD-10-CM | POA: Insufficient documentation

## 2017-01-27 DIAGNOSIS — M79673 Pain in unspecified foot: Secondary | ICD-10-CM

## 2017-01-27 DIAGNOSIS — Z7984 Long term (current) use of oral hypoglycemic drugs: Secondary | ICD-10-CM | POA: Insufficient documentation

## 2017-01-27 MED ORDER — CYCLOBENZAPRINE HCL 10 MG PO TABS: 10.0000 mg | ORAL_TABLET | Freq: Two times a day (BID) | ORAL | 0 refills | Status: DC | PRN

## 2017-01-27 MED ORDER — CYCLOBENZAPRINE HCL 10 MG PO TABS
10.0000 mg | ORAL_TABLET | Freq: Once | ORAL | Status: AC
  Administered 2017-01-27: 10 mg via ORAL
  Filled 2017-01-27: qty 1

## 2017-01-27 MED ORDER — OXYCODONE-ACETAMINOPHEN 5-325 MG PO TABS
2.0000 | ORAL_TABLET | Freq: Once | ORAL | Status: AC
  Administered 2017-01-27: 2 via ORAL
  Filled 2017-01-27: qty 2

## 2017-01-27 NOTE — Discharge Instructions (Signed)
Your workup today is reassuring. You may continue to use her home pain regimen, with the addition of Flexeril for back pain. Your foot x-ray showed degenerative bone changes on both sides with some swelling on the right side. Please follow-up with orthopedics, as well as the spine specialist you have an appointment with next week. I would also like for you to follow-up with your primary care doctor later this week. If you develop significantly worsening pain, or unable to walk, have weakness, or numbness in both legs, or other new or concerning symptoms develop please return to the emergency department.

## 2017-01-27 NOTE — ED Provider Notes (Signed)
[facility] EMERGENCY DEPARTMENT Provider Note   CSN: [id number] Arrival date & time: 01/27/17  1633     History   Chief Complaint Chief Complaint  Patient presents with  . Back Pain  . Leg Pain    HPI Jon Harper is a 61 y.o. male.  Jon Harper is a 61 y.o. Male with history of hypertension, diabetes, renal cell carcinoma s/p R nephrectomy, and chronic low back pain, who presents for evaluation of worsened right-sided low back pain. Patient reports pain sometimes radiates down the right leg, pain occasionally radiates down this other side. Denies weakness of legs, has been ambulatory with this pain, but reports it hurts, loss of bowel/bladder function or saddle anesthesia. Pt repeatedly talks about intermittent numbness or tingling in his feet, but reports this sensation does not radiate from his back down his legs to his feet. No history of IVDU, previous hx of RCC, but no progression since nephrectomy. Denies fevers or chills, abdominal pain or dysuria. Pt tried oxycodone today, prescribed by his PCP, but reports it did not help his pain, has not taken anything else. Pt reports he has an appointment with a spine specialist on Monday, does not think he has had an MRI.  Patient also complaining of complaining of intermittent pain in both feet with walking, reports today it has been worse on the right and left.  Patient also reports intermittent numbness on the bottoms of both feet. Pt does have hx of diabetes, denies any wounds on the feet.       Past Medical History:  Diagnosis Date  . Diabetes mellitus   . Hypertension     There are no active problems to display for this patient.   Past Surgical History:  Procedure Laterality Date  . right nephrectomy         Home Medications    Prior to Admission medications   Medication Sig Start Date End Date Taking? Authorizing Provider  aspirin 81 MG tablet Take 81 mg by mouth daily.    [provider]  atorvastatin (LIPITOR) 20 MG tablet Take 20 mg by mouth daily.    [provider]  clindamycin (CLEOCIN) 150 MG capsule Take 2 capsules (300 mg total) by mouth 3 (three) times daily. Patient not taking: Reported on 12/03/2014 03/14/14   Sciacca, Mable Fill, PA-C  cyclobenzaprine (FLEXERIL) 10 MG tablet Take 1 tablet (10 mg total) by mouth 2 (two) times daily as needed for muscle spasms. 12/03/14   Szekalski, Kaitlyn, PA-C  diazepam (VALIUM) 5 MG tablet Take 1 tablet (5 mg total) by mouth 2 (two) times daily. 11/22/16   Isla Pence, MD  diltiazem (DILACOR XR) 180 MG 24 hr capsule Take 180 mg by mouth daily.    [provider]  diltiazem (TIAZAC) 180 MG 24 hr capsule Take 180 mg by mouth.    [provider]  gabapentin (NEURONTIN) 300 MG capsule Take 300 mg by mouth at bedtime as needed (for nerve pain.).    [provider]  glipiZIDE (GLUCOTROL) 10 MG tablet Take 10 mg by mouth 2 (two) times daily before a meal.    [provider]  hydrochlorothiazide (HYDRODIURIL) 25 MG tablet Take 25 mg by mouth daily.    [provider]  HYDROcodone-acetaminophen (NORCO/VICODIN) 5-325 MG tablet Take 1 tablet by mouth every 4 (four) hours as needed. 11/22/16   Isla Pence, MD  ibuprofen (ADVIL,MOTRIN) 800 MG tablet Take 1 tablet (800 mg total) by  mouth 3 (three) times daily. 11/22/16   Isla Pence, MD  Iron-Vitamins (GERITOL PO) Take 1 tablet by mouth daily.    [provider]  losartan (COZAAR) 50 MG tablet Take 100 mg by mouth daily.    [provider]  methocarbamol (ROBAXIN) 500 MG tablet Take 1 tablet (500 mg total) by mouth 2 (two) times daily. 12/21/15   Sam, Olivia Canter, PA-C  metoprolol tartrate (LOPRESSOR) 25 MG tablet Take 25 mg by mouth 2 (two) times daily.    [provider]  naproxen (NAPROSYN) 500 MG tablet Take 1 tablet (500 mg total) by mouth 2 (two) times daily with a meal. 12/03/14   [name],  PA-C    Family History History reviewed. No pertinent family history.  Social History Social History   Tobacco Use  . Smoking status: Never Smoker  . Smokeless tobacco: Never Used  Substance Use Topics  . Alcohol use: Yes    Comment: occ  . Drug use: No     Allergies   Patient has no known allergies.   Review of Systems Review of Systems  Constitutional: Negative for chills and fever.  Eyes: Negative for visual disturbance.  Respiratory: Negative for shortness of breath.   Cardiovascular: Negative for chest pain.  Gastrointestinal: Negative for abdominal pain, constipation, diarrhea, nausea and vomiting.  Genitourinary: Negative for difficulty urinating, dysuria and hematuria.  Musculoskeletal: Positive for back pain and myalgias. Negative for arthralgias, gait problem, joint swelling and neck pain.  Skin: Negative for rash and wound.  Neurological: Positive for numbness. Negative for dizziness, weakness and headaches.     Physical Exam Updated Vital Signs BP 112/82 (BP Location: Right Arm)   Pulse (!) 101   Temp 98.7 F (37.1 C) (Oral)   Resp 16   SpO2 98%   Physical Exam  Constitutional: He appears well-developed and well-nourished. No distress.  Pt appears somewhat uncomfortable on exam  HENT:  Head: Normocephalic and atraumatic.  Eyes: Right eye exhibits no discharge. Left eye exhibits no discharge.  Cardiovascular: Normal rate, regular rhythm and normal heart sounds.  Pulmonary/Chest: Effort normal and breath sounds normal. No stridor. No respiratory distress. He has no wheezes. He has no rales.  Abdominal: Soft. Bowel sounds are normal. He exhibits no distension and no mass. There is no tenderness. There is no guarding.  Musculoskeletal: He exhibits no edema or deformity.  Minimal tenderness midline of the L-spine, tenderness diffusely across the right side of the low back extending down into the buttock, left side of the lower back is completely  nontender to palpation, range of motion of the lower extremities causes some increased pain on the right side of the back, bilateral lower extremities are nontender to palpation, full active and passive range of motion, all compartments soft, right foot has some tenderness to palpation over the bottom of the foot, no appreciable erythema, warmth or swelling, there are no wounds noted on either foot, DP and TP pulses are 2+ with good capillary refill, sensation is intact and equal throughout both legs, and on the bottoms of both feet  Neurological: He is alert. Coordination normal.  Speech is clear, able to follow commands CN III-XII intact Normal strength in upper extremities bilaterally, strong and equal grip strength, Normal strength with flexion and extension at the hip and knee, normal dorsiflexion and plantarflexion in bilateral lower extremities. DTRs 2+ in bilateral lower extremities  Sensation normal to light and sharp touch throughout bilateral lower extremites Moves extremities without  ataxia, coordination intact  Skin: Skin is warm and dry. Capillary refill takes less than 2 seconds. He is not diaphoretic.  Psychiatric: He has a normal mood and affect. His behavior is normal.  Nursing note and vitals reviewed.    ED Treatments / Results  Labs (all labs ordered are listed, but only abnormal results are displayed) Labs Reviewed - No data to display  EKG  EKG Interpretation None       Radiology No results found.  Procedures Procedures (including critical care time)  Medications Ordered in ED Medications - No data to display   Initial Impression / Assessment and Plan / ED Course  I have reviewed the triage vital signs and the nursing notes.  Pertinent labs & imaging results that were available during my care of the patient were reviewed by me and considered in my medical decision making (see chart for details).  Patient presents complaining of worsening right lower  back pain, which occasionally radiates into the right lower leg. No neurological deficits and normal neuro exam.  Patient can walk but states is painful.  No loss of bowel or bladder control.  No concern for cauda equina.  No fever, night sweats, weight loss, h/o IVDU.  Lumbar x-ray unremarkable.  Pain treated in the emergency department with Percocet and Flexeril.  On reevaluation patient is feeling much better, and has much better mobility, able to ambulate independently in the hallway with no issues.  Patient has follow-up scheduled already with the spine specialist next week.   X-rays of both feet show chronic degenerative changes, there is some evidence of swelling and vascular calcifications on the right foot.  Patient reporting intermittent numbness and tingling in both feet, which sounds very much like peripheral neuropathy likely from patient's diabetes.  Will have patient follow-up regarding this issue with orthopedics as well as his primary doctor.  Patient is stable for discharge home, may continue with his pain medication prescribed by his primary doctor, inform patient we are not able to prescribe chronic pain medication from the emergency department, will provide patient with prescription for Flexeril does not seem to help ease back pain.  Strict return precautions discussed.  Patient expresses understanding and is in agreement with plan, NAD at discharge.  Final Clinical Impressions(s) / ED Diagnoses   Final diagnoses:  Foot pain  Chronic right-sided low back pain without sciatica    ED Discharge Orders        Ordered    cyclobenzaprine (FLEXERIL) 10 MG tablet  2 times daily PRN     01/27/17 2348       Jacqlyn Larsen, PA-C 01/28/17 1135    Daleen Bo, MD 01/28/17 1600

## 2017-01-27 NOTE — ED Triage Notes (Signed)
Pt reports lower back pain that radiates to both legs and feet, more severe in right leg today. Taking percocet at home for the pain with no relief.

## 2017-01-28 NOTE — ED Notes (Signed)
Pt departed in NAD.  

## 2017-02-20 MED FILL — ?METOPROLOL 25 MG TABLET: 25 | 30 days supply | Qty: 60 | Fill #3

## 2017-02-20 MED FILL — ?ATORVASTATIN 20 MG TABLET: 20 | 30 days supply | Qty: 30 | Fill #3

## 2017-02-20 MED FILL — ?GLIPIZIDE 10 MG TABLET: 10 | 30 days supply | Qty: 60 | Fill #3

## 2017-02-20 MED FILL — LOSARTAN-HCTZ 100-25 MG TAB: 100-25 | 30 days supply | Qty: 30 | Fill #3

## 2017-04-02 ENCOUNTER — Emergency Department (HOSPITAL_COMMUNITY)
Admission: EM | Admit: 2017-04-02 | Discharge: 2017-04-02 | Disposition: A | Discharge status: Home / Self Care | Payer: No Typology Code available for payment source | Attending: Emergency Medicine | Admitting: Emergency Medicine

## 2017-04-02 ENCOUNTER — Emergency Department (HOSPITAL_COMMUNITY): Payer: No Typology Code available for payment source

## 2017-04-02 ENCOUNTER — Encounter (HOSPITAL_COMMUNITY): Payer: Self-pay | Admitting: Family Medicine

## 2017-04-02 ENCOUNTER — Other Ambulatory Visit: Payer: Self-pay

## 2017-04-02 DIAGNOSIS — X500XXA Overexertion from strenuous movement or load, initial encounter: Secondary | ICD-10-CM | POA: Insufficient documentation

## 2017-04-02 DIAGNOSIS — E119 Type 2 diabetes mellitus without complications: Secondary | ICD-10-CM | POA: Insufficient documentation

## 2017-04-02 DIAGNOSIS — Z7984 Long term (current) use of oral hypoglycemic drugs: Secondary | ICD-10-CM | POA: Insufficient documentation

## 2017-04-02 DIAGNOSIS — R0789 Other chest pain: Secondary | ICD-10-CM | POA: Insufficient documentation

## 2017-04-02 DIAGNOSIS — Z79899 Other long term (current) drug therapy: Secondary | ICD-10-CM | POA: Insufficient documentation

## 2017-04-02 DIAGNOSIS — Z905 Acquired absence of kidney: Secondary | ICD-10-CM | POA: Insufficient documentation

## 2017-04-02 DIAGNOSIS — S39012A Strain of muscle, fascia and tendon of lower back, initial encounter: Secondary | ICD-10-CM | POA: Insufficient documentation

## 2017-04-02 DIAGNOSIS — Y998 Other external cause status: Secondary | ICD-10-CM | POA: Insufficient documentation

## 2017-04-02 DIAGNOSIS — I10 Essential (primary) hypertension: Secondary | ICD-10-CM | POA: Insufficient documentation

## 2017-04-02 DIAGNOSIS — Y93E6 Activity, residential relocation: Secondary | ICD-10-CM | POA: Insufficient documentation

## 2017-04-02 DIAGNOSIS — Y929 Unspecified place or not applicable: Secondary | ICD-10-CM | POA: Insufficient documentation

## 2017-04-02 LAB — BASIC METABOLIC PANEL
ANION GAP: 7 (ref 5–15)
BUN: 30 mg/dL — ABNORMAL HIGH (ref 6–20)
CHLORIDE: 105 mmol/L (ref 101–111)
CO2: 27 mmol/L (ref 22–32)
Calcium: 9.9 mg/dL (ref 8.9–10.3)
Creatinine, Ser: 1.48 mg/dL — ABNORMAL HIGH (ref 0.61–1.24)
GFR calc non Af Amer: 50 mL/min — ABNORMAL LOW (ref >60)
GFR, EST AFRICAN AMERICAN: 58 mL/min — AB (ref >60)
Glucose, Bld: 86 mg/dL (ref 65–99)
POTASSIUM: 4.3 mmol/L (ref 3.5–5.1)
Sodium: 139 mmol/L (ref 135–145)

## 2017-04-02 LAB — CBC WITH DIFFERENTIAL/PLATELET
Basophils Absolute: 0 10*3/uL (ref 0.0–0.1)
Basophils Relative: 0 %
EOS PCT: 3 %
Eosinophils Absolute: 0.2 10*3/uL (ref 0.0–0.7)
HEMATOCRIT: 40.9 % (ref 39.0–52.0)
HEMOGLOBIN: 14.1 g/dL (ref 13.0–17.0)
LYMPHS ABS: 2.9 10*3/uL (ref 0.7–4.0)
LYMPHS PCT: 34 %
MCH: 28.3 pg (ref 26.0–34.0)
MCHC: 34.5 g/dL (ref 30.0–36.0)
MCV: 82 fL (ref 78.0–100.0)
Monocytes Absolute: 0.5 10*3/uL (ref 0.1–1.0)
Monocytes Relative: 6 %
NEUTROS ABS: 4.9 10*3/uL (ref 1.7–7.7)
NEUTROS PCT: 57 %
PLATELETS: 190 10*3/uL (ref 150–400)
RBC: 4.99 MIL/uL (ref 4.22–5.81)
RDW: 15 % (ref 11.5–15.5)
WBC: 8.6 10*3/uL (ref 4.0–10.5)

## 2017-04-02 LAB — I-STAT TROPONIN, ED
TROPONIN I, POC: 0 ng/mL (ref 0.00–0.08)
Troponin i, poc: 0 ng/mL (ref 0.00–0.08)

## 2017-04-02 MED ORDER — CYCLOBENZAPRINE HCL 10 MG PO TABS: 10.0000 mg | ORAL_TABLET | Freq: Two times a day (BID) | ORAL | 0 refills | Status: DC | PRN

## 2017-04-02 MED ORDER — OXYCODONE-ACETAMINOPHEN 5-325 MG PO TABS: 1.0000 | ORAL_TABLET | Freq: Three times a day (TID) | ORAL | 0 refills | Status: DC | PRN

## 2017-04-02 MED ORDER — OXYCODONE-ACETAMINOPHEN 5-325 MG PO TABS
1.0000 | ORAL_TABLET | Freq: Once | ORAL | Status: AC
  Administered 2017-04-02: 1 via ORAL
  Filled 2017-04-02: qty 1

## 2017-04-02 NOTE — ED Provider Notes (Addendum)
[facility] [place] DEPT Provider Note   CSN: [id number] Arrival date & time: 04/02/17  1334     History   Chief Complaint Chief Complaint  Patient presents with  . Muscle Pain  . Back Pain    HPI Jon Harper is a 62 y.o. male with a past medical history of hypertension, diabetes, who presents to ED for evaluation of right-sided back pain, radiating to shoulder, chest and abdomen.  Describes the pain as sharp, worse with movement and palpation.  States that the pain began 2 days ago.  He is s/p R nephrectomy approximately 1 year ago.  He was told that ever since the surgery that he was prohibited from heavy lifting and had to quit his job as a result of it.  States that 4 days ago, he was helping a friend move by lifting heavy boxes.  States that he has been having intermittent back pain radiating to his shoulder and chest since his surgery.  He has not taken any medications prior to arrival to help with symptoms.  He was never put on any daily medications to help manage the pain.  He denies any injuries or falls, numbness in legs, loss of bowel or bladder function, urinary retention, dysuria, hematuria, fevers, history of IV drug use, prior back surgeries, shortness of breath, hemoptysis, prior MI, DVT or PE, cough or other URI symptoms.  HPI  Past Medical History:  Diagnosis Date  . Diabetes mellitus   . Hypertension     There are no active problems to display for this patient.   Past Surgical History:  Procedure Laterality Date  . right nephrectomy         Home Medications    Prior to Admission medications   Medication Sig Start Date End Date Taking? Authorizing Provider  atorvastatin (LIPITOR) 20 MG tablet Take 20 mg by mouth daily.   Yes [provider]  gabapentin (NEURONTIN) 300 MG capsule Take 300 mg by mouth at bedtime as needed (for nerve pain.).   Yes [provider]  glipiZIDE (GLUCOTROL) 10 MG tablet Take 10 mg by  mouth 2 (two) times daily before a meal.   Yes [provider]  HYDROcodone-acetaminophen (NORCO/VICODIN) 5-325 MG tablet Take 1 tablet by mouth every 4 (four) hours as needed. 11/22/16  Yes Isla Pence, MD  Iron-Vitamins (GERITOL PO) Take 1 tablet by mouth daily.   Yes [provider]  losartan (COZAAR) 50 MG tablet Take 100 mg by mouth daily.   Yes [provider]  methocarbamol (ROBAXIN) 500 MG tablet Take 1 tablet (500 mg total) by mouth 2 (two) times daily. 12/21/15  Yes Sam, Serena Y, PA-C  metoprolol tartrate (LOPRESSOR) 25 MG tablet Take 25 mg by mouth 2 (two) times daily.   Yes [provider]  clindamycin (CLEOCIN) 150 MG capsule Take 2 capsules (300 mg total) by mouth 3 (three) times daily. Patient not taking: Reported on 12/03/2014 03/14/14   Sciacca, Mable Fill, PA-C  cyclobenzaprine (FLEXERIL) 10 MG tablet Take 1 tablet (10 mg total) by mouth 2 (two) times daily as needed for muscle spasms. 04/02/17   Deundre Thong, PA-C  diazepam (VALIUM) 5 MG tablet Take 1 tablet (5 mg total) by mouth 2 (two) times daily. Patient not taking: Reported on 04/02/2017 11/22/16   Isla Pence, MD  ibuprofen (ADVIL,MOTRIN) 800 MG tablet Take 1 tablet (800 mg total) by mouth 3 (three) times daily. Patient not taking: Reported on 04/02/2017 11/22/16   Gilford Raid,  Almyra Free, MD  naproxen (NAPROSYN) 500 MG tablet Take 1 tablet (500 mg total) by mouth 2 (two) times daily with a meal. Patient not taking: Reported on 04/02/2017 12/03/14   [name], PA-C  oxyCODONE-acetaminophen (PERCOCET/ROXICET) 5-325 MG tablet Take 1 tablet by mouth every 8 (eight) hours as needed for severe pain. 04/02/17   Delia Heady, PA-C    Family History History reviewed. No pertinent family history.  Social History Social History   Tobacco Use  . Smoking status: Never Smoker  . Smokeless tobacco: Never Used  Substance Use Topics  . Alcohol use: No    Frequency: Never  . Drug use: No      Allergies   Patient has no known allergies.   Review of Systems Review of Systems  Constitutional: Negative for appetite change, chills and fever.  HENT: Negative for ear pain, rhinorrhea, sneezing and sore throat.   Eyes: Negative for photophobia and visual disturbance.  Respiratory: Negative for cough, chest tightness, shortness of breath and wheezing.   Cardiovascular: Positive for chest pain. Negative for palpitations.  Gastrointestinal: Positive for abdominal pain. Negative for blood in stool, constipation, diarrhea, nausea and vomiting.  Genitourinary: Positive for flank pain. Negative for dysuria, hematuria and urgency.  Musculoskeletal: Positive for back pain. Negative for gait problem and myalgias.  Skin: Negative for rash.  Neurological: Negative for dizziness, weakness and light-headedness.     Physical Exam Updated Vital Signs BP (!) 143/89 (BP Location: Right Arm) Comment: Simultaneous filing. User may not have seen previous data.  Pulse (!) 103 Comment: Simultaneous filing. User may not have seen previous data.  Temp 98.5 F (36.9 C) (Oral)   Resp 16 Comment: Simultaneous filing. User may not have seen previous data.  Ht 5\' 11"  (1.803 m)   Wt 113.4 kg (250 lb)   SpO2 98% Comment: Simultaneous filing. User may not have seen previous data.  BMI 34.87 kg/m   Physical Exam  Constitutional: He appears well-developed and well-nourished. No distress.  Nontoxic appearing and in no acute distress.  Ambulatory with normal gait.  HENT:  Head: Normocephalic and atraumatic.  Nose: Nose normal.  Eyes: Conjunctivae and EOM are normal. Right eye exhibits no discharge. Left eye exhibits no discharge. No scleral icterus.  Neck: Normal range of motion. Neck supple.  Cardiovascular: Normal rate, regular rhythm, normal heart sounds and intact distal pulses. Exam reveals no gallop and no friction rub.  No murmur heard. Pulmonary/Chest: Effort normal and breath sounds normal.  No respiratory distress.    Abdominal: Soft. Bowel sounds are normal. He exhibits no distension. There is no tenderness. There is no guarding.  No abdominal TTP.  Musculoskeletal: Normal range of motion. He exhibits no edema.       Arms: No midline spinal tenderness present in lumbar, thoracic or cervical spine. No step-off palpated. No visible bruising, edema or temperature change noted. No objective signs of numbness present. No saddle anesthesia. 2+ DP pulses bilaterally. Sensation intact to light touch. Strength 5/5 in bilateral lower extremities. No lower extremity edema, erythema or calf tenderness bilaterally.  Neurological: He is alert. He exhibits normal muscle tone. Coordination normal.  Skin: Skin is warm and dry. No rash noted.  Psychiatric: He has a normal mood and affect.  Nursing note and vitals reviewed.    ED Treatments / Results  Labs (all labs ordered are listed, but only abnormal results are displayed) Labs Reviewed  BASIC METABOLIC PANEL - Abnormal; Notable for the following components:  Result Value   BUN 30 (*)    Creatinine, Ser 1.48 (*)    GFR calc non Af Amer 50 (*)    GFR calc Af Amer 58 (*)    All other components within normal limits  CBC WITH DIFFERENTIAL/PLATELET  I-STAT TROPONIN, ED  I-STAT TROPONIN, ED    EKG  EKG Interpretation  Date/Time:  Wednesday April 02 2017 18:26:59 EST Ventricular Rate:  96 PR Interval:    QRS Duration: 95 QT Interval:  339 QTC Calculation: 429 R Axis:   37 Text Interpretation:  Ectopic atrial rhythm Atrial premature complex Borderline short PR interval Low voltage, precordial leads Abnormal R-wave progression, early transition Confirmed by Tanna Furry [phone]) on 04/02/2017 6:30:49 PM       Radiology Dg Chest 2 View  Result Date: 04/02/2017 CLINICAL DATA:  62 year old male with a history right-sided back and shoulder pain EXAM: CHEST  2 VIEW COMPARISON:  06/25/2012, 12/29/2010 FINDINGS: Cardiomediastinal  silhouette unchanged in size and contour. No evidence of central vascular congestion. No pneumothorax or pleural effusion. No confluent airspace disease. No evidence of acute displaced fracture. IMPRESSION: No radiographic evidence of acute cardiopulmonary disease. Electronically Signed   By: Corrie Mckusick D.O.   On: 04/02/2017 17:54    Procedures Procedures (including critical care time)  Medications Ordered in ED Medications  oxyCODONE-acetaminophen (PERCOCET/ROXICET) 5-325 MG per tablet 1 tablet (1 tablet Oral Given 04/02/17 1710)     Initial Impression / Assessment and Plan / ED Course  I have reviewed the triage vital signs and the nursing notes.  Pertinent labs & imaging results that were available during my care of the patient were reviewed by me and considered in my medical decision making (see chart for details).     Patient with a past medical history of hypertension, diabetes, presents to ED for evaluation of right-sided back pain, radiating to shoulder, chest and abdomen.  He is status post right nephrectomy approximately 1 year ago.  He was told that he was prohibited from heavy lifting for a prolonged amount of time.  He does admit to heavy lifting yesterday when helping a friend move.  States that pain began shortly after.  He is not taking any medications to help with symptoms.  He denies any red flag symptoms of back pain including no history of IV drug use, history of prior back surgeries, numbness in legs, loss of bowel or bladder function, urinary retention, other urinary symptoms, fever or changes in gait.  On physical exam he does appear uncomfortable.  There is right-sided paraspinal musculature tenderness to palpation on the lumbar level which radiates to the entire right side of the abdomen and chest.  The chest tenderness is worse with palpation as well. Labwork including CBC, BMP unremarkable. Initial troponin negative. EKG showed ectopic atrial rhythm but no signs of  ischemia. Delta troponin also negative, which was obtained d/t patient's HEART score of 3 (low risk). He reports improvement in his pain with oral pain medication given here. He is Well's criteria negative, low suspicion for PE. To make him aware of his abnormal EKG finding and advised that he should follow-up with cardiology for further evaluation.  I suspect that his symptoms are due to musculoskeletal pain in origin, rather than cardiac, pulmonary or spinal cord injury.  Will give short course of pain medication, advised muscle relaxer and Tylenol and advised him to continue heat therapy and stretching to help with symptoms.  I did tell him that because his  nephrologist told him that he should refrain from heavy lifting, he should comply with this to prevent any worsening of his symptoms. Patient appears stable for discharge at this time.  Strict return precautions given.  Final Clinical Impressions(s) / ED Diagnoses   Final diagnoses:  Strain of lumbar region, initial encounter  Chest wall pain    ED Discharge Orders        Ordered    oxyCODONE-acetaminophen (PERCOCET/ROXICET) 5-325 MG tablet  Every 8 hours PRN     04/02/17 2128    cyclobenzaprine (FLEXERIL) 10 MG tablet  2 times daily PRN     04/02/17 2128       Delia Heady, PA-C 04/02/17 2141    Daleen Bo, MD 04/03/17 0001    Delia Heady, PA-C 04/03/17 0036    Daleen Bo, MD 04/03/17 [phone]

## 2017-04-02 NOTE — ED Notes (Signed)
Pt is alert and oriented x 4 and verbally responsive pt reports back pain that radiates to lower back . Pt reports that pain worsens with activity.

## 2017-04-02 NOTE — ED Triage Notes (Signed)
Patient reports this weekend he was lifting boxes on Saturday when he developed right sided back, shoulder, chest, and abd. He relates pain to muscle pain from lifting the box. He reports he was instructed to not be doing heavy lifting when his right kidney was removed. Patient reports he has not took any medication for the pain. Patients respirations are even, regular, and unlabored.

## 2017-04-02 NOTE — ED Notes (Signed)
Spoke with Legrand Como, [place] about patients complaint and condition. He reported at this time no new orders need to bed placed.

## 2017-04-02 NOTE — Discharge Instructions (Signed)
Please read the attached information regarding your condition. Take Percocet as needed for severe pain. Otherwise take Tylenol or Flexeril as directed to help with pain. Apply heat and stretch area as tolerated. Follow up with cardiologist's office for further evaluation of your heart rhythm, that was found on your EKG today. Return to ED for worsening chest pain, numbness in legs, loss of bowel or bladder function, injuries.

## 2017-04-07 MED FILL — ?ATORVASTATIN 20MG TABLET: 20 | 30 days supply | Qty: 30 | Fill #0

## 2017-04-07 MED FILL — LOSARTAN-HCTZ 100-25 MG TAB: 100-25 | 30 days supply | Qty: 30 | Fill #0

## 2017-04-16 ENCOUNTER — Encounter: Payer: Self-pay | Admitting: Family Medicine

## 2017-04-16 ENCOUNTER — Ambulatory Visit (INDEPENDENT_AMBULATORY_CARE_PROVIDER_SITE_OTHER): Payer: Self-pay | Admitting: Family Medicine

## 2017-04-16 VITALS — BP 108/86 | HR 100 | Temp 98.1°F | Ht 71.0 in | Wt 241.0 lb

## 2017-04-16 DIAGNOSIS — I10 Essential (primary) hypertension: Secondary | ICD-10-CM

## 2017-04-16 DIAGNOSIS — N183 Chronic kidney disease, stage 3 unspecified: Secondary | ICD-10-CM

## 2017-04-16 DIAGNOSIS — Z905 Acquired absence of kidney: Secondary | ICD-10-CM

## 2017-04-16 DIAGNOSIS — E785 Hyperlipidemia, unspecified: Secondary | ICD-10-CM | POA: Insufficient documentation

## 2017-04-16 DIAGNOSIS — Z85528 Personal history of other malignant neoplasm of kidney: Secondary | ICD-10-CM | POA: Insufficient documentation

## 2017-04-16 DIAGNOSIS — R9431 Abnormal electrocardiogram [ECG] [EKG]: Secondary | ICD-10-CM

## 2017-04-16 DIAGNOSIS — E1169 Type 2 diabetes mellitus with other specified complication: Secondary | ICD-10-CM | POA: Insufficient documentation

## 2017-04-16 DIAGNOSIS — E119 Type 2 diabetes mellitus without complications: Secondary | ICD-10-CM

## 2017-04-16 LAB — POCT URINALYSIS DIP (DEVICE)
Bilirubin Urine: NEGATIVE
GLUCOSE, UA: NEGATIVE mg/dL
Hgb urine dipstick: NEGATIVE
Ketones, ur: NEGATIVE mg/dL
LEUKOCYTES UA: NEGATIVE
NITRITE: NEGATIVE
PROTEIN: NEGATIVE mg/dL
SPECIFIC GRAVITY, URINE: 1.015 (ref 1.005–1.030)
UROBILINOGEN UA: 0.2 mg/dL (ref 0.0–1.0)
pH: 7 (ref 5.0–8.0)

## 2017-04-16 MED ORDER — GLIPIZIDE 10 MG PO TABS: 10.0000 mg | ORAL_TABLET | Freq: Two times a day (BID) | ORAL | 0 refills | Status: DC

## 2017-04-16 MED ORDER — PREDNISONE 20 MG PO TABS: 60.0000 mg | ORAL_TABLET | Freq: Every day | ORAL | 0 refills | Status: AC

## 2017-04-16 MED ORDER — CLINDAMYCIN HCL 150 MG PO CAPS: 300.0000 mg | ORAL_CAPSULE | Freq: Three times a day (TID) | ORAL | 0 refills | Status: DC

## 2017-04-16 MED ORDER — METOPROLOL TARTRATE 25 MG PO TABS: 25.0000 mg | ORAL_TABLET | Freq: Two times a day (BID) | ORAL | 0 refills | Status: DC

## 2017-04-16 MED ORDER — LOSARTAN POTASSIUM 50 MG PO TABS: 100.0000 mg | ORAL_TABLET | Freq: Every day | ORAL | 1 refills | Status: DC

## 2017-04-16 MED ORDER — GABAPENTIN 300 MG PO CAPS: 300.0000 mg | ORAL_CAPSULE | Freq: Two times a day (BID) | ORAL | 0 refills | Status: DC

## 2017-04-16 MED FILL — predniSONE 20 MG TABS: 20 | 5 days supply | Qty: 15 | Fill #0

## 2017-04-16 MED FILL — ?GLIPIZIDE 10 MG TABLET: 10 | 30 days supply | Qty: 60 | Fill #0

## 2017-04-16 MED FILL — ?METOPROLOL 25 MG TABLET: 25 | 30 days supply | Qty: 60 | Fill #0

## 2017-04-16 MED FILL — GABAPENTIN 300 MG CAPSULE: 300 | 30 days supply | Qty: 60 | Fill #0

## 2017-04-16 MED FILL — LOSARTAN POTASSIUM 50 MG TA: 50 | 30 days supply | Qty: 60 | Fill #0

## 2017-04-16 NOTE — Progress Notes (Signed)
Patient ID: Jon Harper, male    DOB: [birth year], 62 y.o.   MRN: [id number]  PCP: Jon Jun, FNP  Chief Complaint  Patient presents with  . Establish Care  . Hospitalization Follow-up    Subjective:  HPI Jon Harper is a 62 y.o. male with CKD, hx of R. nephrectomy secondary to renal cell carcinoma, chronic back pain, presents to establish care and ED follow-up.    ED follow-up Previously followed by [facility] provider Jon Harper was his PCP.  He was referred here today by the ED.  He reports currently being managed at the bone and [facility] for chronic back pain and currently engaging in physical therapy rehab through [facility] physical therapy department.  Recently presented to the emergency department with a complaint of low back pain after lifting light boxes at his place of employment.  He reports that the pain developed subsequently 2 days following this activity.  He reports that he experienced pain to both of his shoulders chest wall and low back.  He was prescribed pain medication at the ED and advised to follow-up here today.  Back pain is not new according to prior records he has experience chronic low back pain.  He denies any formal evaluation for orthopedic or sports medicine for ongoing back pain.  He is uninsured.  Complains of back pain is now interfering with bedtime sleep and prohibiting him from driving long distances.  Pain is exacerbated by both sitting, driving, standing, lying or any positional changes.  He denies back pain interfering with defecation or urination.  Today he reports resolution of chest wall pain and shoulder pain.  During his recent ED visit he did have an abnormal EKG which showed some ectopic atrial rhythms with PACs.  He reports taking heart medicine however cannot elaborate on what condition he is taking heart medications treat.  He is currently prescribed metoprolol and losartan.  He reports he is  prescribed another medication however know the name of it and does not have the medication with him today.  At present he denies any shortness of breath, dizziness, chest pain, or new weakness.  Chronic conditions Jon Harper reports that he suffers from hypertension, type 2 diabetes, CKD 3, and some unspecified heart condition.  He has a documented history of renal cell carcinoma which was treated by Jon Harper.He was diagnosed with renal cell carcinoma 2 years ago and is status post right nephrectomy which occurred 1 year ago.  He has been lost to follow-up with nephrology and requested a new referral today to see a nephrologist closer to [facility].  Social History   Socioeconomic History  . Marital status: Married    Spouse name: Not on file  . Number of children: Not on file  . Years of education: Not on file  . Highest education level: Not on file  Social Needs  . Financial resource strain: Not on file  . Food insecurity - worry: Not on file  . Food insecurity - inability: Not on file  . Transportation needs - medical: Not on file  . Transportation needs - non-medical: Not on file  Occupational History  . Not on file  Tobacco Use  . Smoking status: Never Smoker  . Smokeless tobacco: Never Used  Substance and Sexual Activity  . Alcohol use: No    Frequency: Never  . Drug use: No  . Sexual activity: Not on file  Other Topics  Concern  . Not on file  Social History Narrative  . Not on file    Family History  Problem Relation Age of Onset  . Diabetes Mother        non-insulin   . Diabetes Father        non-insulin   . Diabetes Sister        non-insulin    No family history of cancer, heart disease, or lung disease.  Review of Systems  Constitutional: Negative.   Respiratory: Negative.   Cardiovascular: Negative.   Musculoskeletal: Positive for back pain.  Psychiatric/Behavioral: Negative.    No Known Allergies  Prior to Admission medications    Medication Sig Start Date End Date Taking? Authorizing Provider  atorvastatin (LIPITOR) 20 MG tablet Take 20 mg by mouth daily.   Yes [provider]  cyclobenzaprine (FLEXERIL) 10 MG tablet Take 1 tablet (10 mg total) by mouth 2 (two) times daily as needed for muscle spasms. 04/02/17  Yes Khatri, Hina, PA-C  gabapentin (NEURONTIN) 300 MG capsule Take 300 mg by mouth at bedtime as needed (for nerve pain.).   Yes [provider]  glipiZIDE (GLUCOTROL) 10 MG tablet Take 10 mg by mouth 2 (two) times daily before a meal.   Yes [provider]  Iron-Vitamins (GERITOL PO) Take 1 tablet by mouth daily.   Yes [provider]  losartan (COZAAR) 50 MG tablet Take 100 mg by mouth daily.   Yes [provider]  metoprolol tartrate (LOPRESSOR) 25 MG tablet Take 25 mg by mouth 2 (two) times daily.   Yes [provider]  naproxen (NAPROSYN) 500 MG tablet Take 1 tablet (500 mg total) by mouth 2 (two) times daily with a meal. 12/03/14  Yes Szekalski, Kaitlyn, PA-C  oxyCODONE-acetaminophen (PERCOCET/ROXICET) 5-325 MG tablet Take 1 tablet by mouth every 8 (eight) hours as needed for severe pain. 04/02/17  Yes Khatri, Hina, PA-C  clindamycin (CLEOCIN) 150 MG capsule Take 2 capsules (300 mg total) by mouth 3 (three) times daily. Patient not taking: Reported on 12/03/2014 03/14/14   Sciacca, Marissa, PA-C  diazepam (VALIUM) 5 MG tablet Take 1 tablet (5 mg total) by mouth 2 (two) times daily. Patient not taking: Reported on 04/02/2017 11/22/16   Isla Pence, MD  HYDROcodone-acetaminophen (NORCO/VICODIN) 5-325 MG tablet Take 1 tablet by mouth every 4 (four) hours as needed. Patient not taking: Reported on 04/16/2017 11/22/16   Isla Pence, MD  ibuprofen (ADVIL,MOTRIN) 800 MG tablet Take 1 tablet (800 mg total) by mouth 3 (three) times daily. Patient not taking: Reported on 04/02/2017 11/22/16   Isla Pence, MD  methocarbamol (ROBAXIN) 500 MG tablet Take 1 tablet (500  mg total) by mouth 2 (two) times daily. Patient not taking: Reported on 04/16/2017 12/21/15   Carmelina Dane    Past Medical, Surgical Family and Social History reviewed and updated.    Objective:   Today's Vitals   04/16/17 0805  BP: 108/86  Pulse: 100  Temp: 98.1 F (36.7 C)  TempSrc: Oral  SpO2: 99%  Weight: 241 lb (109.3 kg)  Height: 5\' 11"  (1.803 m)    Wt Readings from Last 3 Encounters:  04/16/17 241 lb (109.3 kg)  04/02/17 250 lb (113.4 kg)  09/29/16 245 lb (111.1 kg)    Physical Exam  Constitutional: He is oriented to person, place, and time. He appears well-developed and well-nourished.  HENT:  Head: Normocephalic and atraumatic.  Eyes: Conjunctivae and EOM are normal. Pupils are equal, round,  and reactive to light.  Neck: Normal range of motion. Neck supple.  Cardiovascular: Normal rate, regular rhythm, normal heart sounds and intact distal pulses.  Pulmonary/Chest: Effort normal and breath sounds normal.  Abdominal: Soft. Bowel sounds are normal.  Musculoskeletal:       Lumbar back: He exhibits decreased range of motion and tenderness.  Lymphadenopathy:    He has no cervical adenopathy.  Neurological: He is alert and oriented to person, place, and time.  Skin: Skin is warm and dry.  Psychiatric: He has a normal mood and affect. His behavior is normal. Judgment and thought content normal.   Assessment & Plan:  1. Type 2 diabetes mellitus without complication, without long-term current use of insulin ([place]), Last A1c unknown. Will check A1C today. Patient currently managed on glipizide only.  2. Abnormal EKG-significant for ectopic atrial rhythm with PACs.  Rate 97.  EKG similar to strip obtained during recent ED visit.  Risk factors for cardiovascular disease include weight, diabetes, hypertension, hyperlipidemia.  Will refer patient to cardiology for second opinion.  He is asymptomatic today of chest pain.   3. Hypertension, unspecified type, well  controlled today.  Refilled antihypertension medications previously prescribed.  No changes today. We have discussed target BP range and blood pressure goal. I have advised patient to check BP regularly and to call us back or report to clinic if the numbers are consistently higher than 140/90. We discussed the importance of compliance with medical therapy and DASH diet recommended, consequences of uncontrolled hypertension discussed.   4. Hyperlipidemia, unspecified hyperlipidemia type, patient fasting today will check a lipid panel.  He is prescribed Lipitor however the bottle was full today and it does not appear that he has been taking medication.   5. Hx of renal cell cancer, no recent PSA on file. Will check today. No recent oncology or nephrology follow-up. Will refer patient to nephrology for management of CKD and defer to nephrologist if further evaluation by oncology is indicated.   6. Stage 3 chronic kidney disease ([place]), refer to item # 5  7. History of unilateral nephrectomy, refer to #5   Meds ordered this encounter  Medications  . DISCONTD: clindamycin (CLEOCIN) 150 MG capsule    Sig: Take 2 capsules (300 mg total) by mouth 3 (three) times daily.    Dispense:  56 capsule    Refill:  0    Order Specific Question:   Supervising Provider    Answer:   Tresa Garter [id number]  . losartan (COZAAR) 50 MG tablet    Sig: Take 2 tablets (100 mg total) by mouth daily.    Dispense:  90 tablet    Refill:  1    Order Specific Question:   Supervising Provider    Answer:   Tresa Garter [id number]  . metoprolol tartrate (LOPRESSOR) 25 MG tablet    Sig: Take 1 tablet (25 mg total) by mouth 2 (two) times daily.    Dispense:  180 tablet    Refill:  0    Order Specific Question:   Supervising Provider    Answer:   Tresa Garter [id number]  . glipiZIDE (GLUCOTROL) 10 MG tablet    Sig: Take 1 tablet (10 mg total) by mouth 2 (two) times daily before a meal.    Dispense:   180 tablet    Refill:  0    Order Specific Question:   Supervising Provider    Answer:   Tresa Garter [id number]  .  gabapentin (NEURONTIN) 300 MG capsule    Sig: Take 1 capsule (300 mg total) by mouth 2 (two) times daily.    Dispense:  180 capsule    Refill:  0    Order Specific Question:   Supervising Provider    Answer:   Tresa Garter [id number]  . predniSONE (DELTASONE) 20 MG tablet    Sig: Take 3 tablets (60 mg total) by mouth daily with breakfast for 5 days.    Dispense:  15 tablet    Refill:  0    Order Specific Question:   Supervising Provider    Answer:   Tresa Garter [id number]    Orders Placed This Encounter  Procedures  . TSH  . Lipid panel  . PSA  . Hemoglobin A1c  . POCT urinalysis dip (device)  . EKG 12-Lead    RTC: Follow-up on chronic conditions and review labs.   Carroll Sage. Kenton Kingfisher, MSN, FNP-C [facility]  [address] [place], [place] [address] [phone]

## 2017-04-16 NOTE — Patient Instructions (Addendum)
Will refer you to [facility] and [place].  If you have not received a call in 7-10 days please follow-up here at the office.  I have increased her dose of gabapentin to 300 mg twice daily for back pain.  To reduce inflammation I have prescribed you prednisone 60 mg with breakfast times 5 days.  Continue physical therapy as currently prescribed.   I have refilled her metoprolol, losartan and glipizide.  Based on your results of your lab work today these medications may be adjusted.  You will be notified by phone if any adjustments in medication doses.  I would like for you to follow-up in office in 4 weeks to follow-up on blood pressure, lab results, and diabetes.      Back Pain, Adult Back pain is very common. The pain often gets better over time. The cause of back pain is usually not dangerous. Most people can learn to manage their back pain on their own. Follow these instructions at home: Watch your back pain for any changes. The following actions may help to lessen any pain you are feeling:  Stay active. Start with short walks on flat ground if you can. Try to walk farther each day.  Exercise regularly as told by your doctor. Exercise helps your back heal faster. It also helps avoid future injury by keeping your muscles strong and flexible.  Do not sit, drive, or stand in one place for more than 30 minutes.  Do not stay in bed. Resting more than 1-2 days can slow down your recovery.  Be careful when you bend or lift an object. Use good form when lifting: ? Bend at your knees. ? Keep the object close to your body. ? Do not twist.  Sleep on a firm mattress. Lie on your side, and bend your knees. If you lie on your back, put a pillow under your knees.  Take medicines only as told by your doctor.  Put ice on the injured area. ? Put ice in a plastic bag. ? Place a towel between your skin and the bag. ? Leave the ice on for 20 minutes, 2-3 times a day for the first  2-3 days. After that, you can switch between ice and heat packs.  Avoid feeling anxious or stressed. Find good ways to deal with stress, such as exercise.  Maintain a healthy weight. Extra weight puts stress on your back.  Contact a doctor if:  You have pain that does not go away with rest or medicine.  You have worsening pain that goes down into your legs or buttocks.  You have pain that does not get better in one week.  You have pain at night.  You lose weight.  You have a fever or chills. Get help right away if:  You cannot control when you poop (bowel movement) or pee (urinate).  Your arms or legs feel weak.  Your arms or legs lose feeling (numbness).  You feel sick to your stomach (nauseous) or throw up (vomit).  You have belly (abdominal) pain.  You feel like you may pass out (faint). This information is not intended to replace advice given to you by your health care provider. Make sure you discuss any questions you have with your health care provider. Document Released: 08/14/2007 Document Revised: 08/03/2015 Document Reviewed: 06/29/2013 Elsevier Interactive Patient Education  2018 [employer].     Diabetes Mellitus and Nutrition When you have diabetes (diabetes mellitus), it is very important to have healthy  eating habits because your blood sugar (glucose) levels are greatly affected by what you eat and drink. Eating healthy foods in the appropriate amounts, at about the same times every day, can help you:  Control your blood glucose.  Lower your risk of heart disease.  Improve your blood pressure.  Reach or maintain a healthy weight.  Every person with diabetes is different, and each person has different needs for a meal plan. Your health care provider may recommend that you work with a diet and nutrition specialist (dietitian) to make a meal plan that is best for you. Your meal plan may vary depending on factors such as:  The calories you need.  The  medicines you take.  Your weight.  Your blood glucose, blood pressure, and cholesterol levels.  Your activity level.  Other health conditions you have, such as heart or kidney disease.  How do carbohydrates affect me? Carbohydrates affect your blood glucose level more than any other type of food. Eating carbohydrates naturally increases the amount of glucose in your blood. Carbohydrate counting is a method for keeping track of how many carbohydrates you eat. Counting carbohydrates is important to keep your blood glucose at a healthy level, especially if you use insulin or take certain oral diabetes medicines. It is important to know how many carbohydrates you can safely have in each meal. This is different for every person. Your dietitian can help you calculate how many carbohydrates you should have at each meal and for snack. Foods that contain carbohydrates include:  Bread, cereal, rice, pasta, and crackers.  Potatoes and corn.  Peas, beans, and lentils.  Milk and yogurt.  Fruit and juice.  Desserts, such as cakes, cookies, ice cream, and candy.  How does alcohol affect me? Alcohol can cause a sudden decrease in blood glucose (hypoglycemia), especially if you use insulin or take certain oral diabetes medicines. Hypoglycemia can be a life-threatening condition. Symptoms of hypoglycemia (sleepiness, dizziness, and confusion) are similar to symptoms of having too much alcohol. If your health care provider says that alcohol is safe for you, follow these guidelines:  Limit alcohol intake to no more than 1 drink per day for nonpregnant women and 2 drinks per day for men. One drink equals 12 oz of beer, 5 oz of wine, or 1 oz of hard liquor.  Do not drink on an empty stomach.  Keep yourself hydrated with water, diet soda, or unsweetened iced tea.  Keep in mind that regular soda, juice, and other mixers may contain a lot of sugar and must be counted as carbohydrates.  What are tips  for following this plan? Reading food labels  Start by checking the serving size on the label. The amount of calories, carbohydrates, fats, and other nutrients listed on the label are based on one serving of the food. Many foods contain more than one serving per package.  Check the total grams (g) of carbohydrates in one serving. You can calculate the number of servings of carbohydrates in one serving by dividing the total carbohydrates by 15. For example, if a food has 30 g of total carbohydrates, it would be equal to 2 servings of carbohydrates.  Check the number of grams (g) of saturated and trans fats in one serving. Choose foods that have low or no amount of these fats.  Check the number of milligrams (mg) of sodium in one serving. Most people should limit total sodium intake to less than 2,300 mg per day.  Always check the  nutrition information of foods labeled as "low-fat" or "nonfat". These foods may be higher in added sugar or refined carbohydrates and should be avoided.  Talk to your dietitian to identify your daily goals for nutrients listed on the label. Shopping  Avoid buying canned, premade, or processed foods. These foods tend to be high in fat, sodium, and added sugar.  Shop around the outside edge of the grocery store. This includes fresh fruits and vegetables, bulk grains, fresh meats, and fresh dairy. Cooking  Use low-heat cooking methods, such as baking, instead of high-heat cooking methods like deep frying.  Cook using healthy oils, such as olive, canola, or sunflower oil.  Avoid cooking with butter, cream, or high-fat meats. Meal planning  Eat meals and snacks regularly, preferably at the same times every day. Avoid going long periods of time without eating.  Eat foods high in fiber, such as fresh fruits, vegetables, beans, and whole grains. Talk to your dietitian about how many servings of carbohydrates you can eat at each meal.  Eat 4-6 ounces of lean protein  each day, such as lean meat, chicken, fish, eggs, or tofu. 1 ounce is equal to 1 ounce of meat, chicken, or fish, 1 egg, or 1/4 cup of tofu.  Eat some foods each day that contain healthy fats, such as avocado, nuts, seeds, and fish. Lifestyle   Check your blood glucose regularly.  Exercise at least 30 minutes 5 or more days each week, or as told by your health care provider.  Take medicines as told by your health care provider.  Do not use any products that contain nicotine or tobacco, such as cigarettes and e-cigarettes. If you need help quitting, ask your health care provider.  Work with a Social worker or diabetes educator to identify strategies to manage stress and any emotional and social challenges. What are some questions to ask my health care provider?  Do I need to meet with a diabetes educator?  Do I need to meet with a dietitian?  What number can I call if I have questions?  When are the best times to check my blood glucose? Where to find more information:  American Diabetes Association: diabetes.org/food-and-fitness/food  Academy of Nutrition and Dietetics: PokerClues.dk  [employer] of Diabetes and Digestive and Kidney Diseases (NIH): ContactWire.be Summary  A healthy meal plan will help you control your blood glucose and maintain a healthy lifestyle.  Working with a diet and nutrition specialist (dietitian) can help you make a meal plan that is best for you.  Keep in mind that carbohydrates and alcohol have immediate effects on your blood glucose levels. It is important to count carbohydrates and to use alcohol carefully. This information is not intended to replace advice given to you by your health care provider. Make sure you discuss any questions you have with your health care provider. Document Released: 11/22/2004 Document Revised:  04/01/2016 Document Reviewed: 04/01/2016 Elsevier Interactive Patient Education  [employer].

## 2017-04-17 LAB — LIPID PANEL
CHOL/HDL RATIO: 3.4 ratio (ref 0.0–5.0)
Cholesterol, Total: 206 mg/dL — ABNORMAL HIGH (ref 100–199)
HDL: 60 mg/dL (ref >39)
LDL CALC: 133 mg/dL — AB (ref 0–99)
Triglycerides: 66 mg/dL (ref 0–149)
VLDL CHOLESTEROL CAL: 13 mg/dL (ref 5–40)

## 2017-04-17 LAB — HEMOGLOBIN A1C
Est. average glucose Bld gHb Est-mCnc: 140 mg/dL
Hgb A1c MFr Bld: 6.5 % — ABNORMAL HIGH (ref 4.8–5.6)

## 2017-04-17 LAB — TSH: TSH: 1.37 u[IU]/mL (ref 0.450–4.500)

## 2017-04-17 LAB — PSA: PROSTATE SPECIFIC AG, SERUM: 4.1 ng/mL — AB (ref 0.0–4.0)

## 2017-04-21 ENCOUNTER — Other Ambulatory Visit: Payer: Self-pay | Admitting: Family Medicine

## 2017-04-21 DIAGNOSIS — R972 Elevated prostate specific antigen [PSA]: Secondary | ICD-10-CM

## 2017-04-21 MED ORDER — ATORVASTATIN CALCIUM 40 MG PO TABS: 20.0000 mg | ORAL_TABLET | Freq: Every day | ORAL | 1 refills | Status: DC

## 2017-04-21 NOTE — Progress Notes (Signed)
Contact patient to advise the following:  Cholesterol panel is abnormal. He will need to resume atorvastatin 40 mg once daily. Medication has been routed to pharmacy on file.   PSA level is mildly elevated. He will need to have this level repeated in 6 months-schedule pt for lab visits in 6 months to repeat level. If he becomes symptomatic with symptoms of urinary retention or urinary frequency, he should return for care sooner.    Carroll Sage. Kenton Kingfisher, MSN, FNP-C [facility]  [address] [place], [place] [address] [phone]

## 2017-04-22 MED ORDER — ATORVASTATIN CALCIUM 40 MG PO TABS: 20.0000 mg | ORAL_TABLET | Freq: Every day | ORAL | 1 refills | Status: DC

## 2017-04-22 NOTE — Progress Notes (Signed)
Left a vm for patient to callback 

## 2017-04-22 NOTE — Progress Notes (Signed)
Patient notified

## 2017-04-22 NOTE — Addendum Note (Signed)
Addended by: Jefferson Fuel on: 04/22/2017 11:26 AM   Modules accepted: Orders

## 2017-05-05 ENCOUNTER — Ambulatory Visit: Payer: Self-pay | Attending: Internal Medicine

## 2017-05-14 ENCOUNTER — Encounter: Payer: Self-pay | Admitting: Family Medicine

## 2017-05-14 ENCOUNTER — Ambulatory Visit (INDEPENDENT_AMBULATORY_CARE_PROVIDER_SITE_OTHER): Payer: Self-pay | Admitting: Family Medicine

## 2017-05-14 ENCOUNTER — Ambulatory Visit: Payer: Self-pay | Admitting: Family Medicine

## 2017-05-14 ENCOUNTER — Other Ambulatory Visit: Payer: Self-pay | Admitting: Family Medicine

## 2017-05-14 VITALS — BP 122/76 | HR 100 | Temp 99.0°F | Resp 16 | Ht 71.0 in | Wt 245.0 lb

## 2017-05-14 DIAGNOSIS — R350 Frequency of micturition: Secondary | ICD-10-CM

## 2017-05-14 DIAGNOSIS — Z23 Encounter for immunization: Secondary | ICD-10-CM

## 2017-05-14 DIAGNOSIS — N400 Enlarged prostate without lower urinary tract symptoms: Secondary | ICD-10-CM

## 2017-05-14 DIAGNOSIS — R972 Elevated prostate specific antigen [PSA]: Secondary | ICD-10-CM

## 2017-05-14 LAB — POCT URINALYSIS DIP (DEVICE)
BILIRUBIN URINE: NEGATIVE
Glucose, UA: NEGATIVE mg/dL
Ketones, ur: NEGATIVE mg/dL
Leukocytes, UA: NEGATIVE
NITRITE: NEGATIVE
PH: 5.5 (ref 5.0–8.0)
Protein, ur: NEGATIVE mg/dL
Specific Gravity, Urine: 1.015 (ref 1.005–1.030)
Urobilinogen, UA: 0.2 mg/dL (ref 0.0–1.0)

## 2017-05-14 MED ORDER — GLIPIZIDE 10 MG PO TABS: 10.0000 mg | ORAL_TABLET | Freq: Two times a day (BID) | ORAL | 0 refills | Status: DC

## 2017-05-14 MED ORDER — METOPROLOL TARTRATE 25 MG PO TABS: 25.0000 mg | ORAL_TABLET | Freq: Two times a day (BID) | ORAL | 0 refills | Status: DC

## 2017-05-14 MED ORDER — ATORVASTATIN CALCIUM 40 MG PO TABS: 40.0000 mg | ORAL_TABLET | Freq: Every day | ORAL | 1 refills | Status: DC

## 2017-05-14 MED ORDER — LOSARTAN POTASSIUM 50 MG PO TABS: 100.0000 mg | ORAL_TABLET | Freq: Every day | ORAL | 1 refills | Status: DC

## 2017-05-14 MED ORDER — GABAPENTIN 300 MG PO CAPS: 300.0000 mg | ORAL_CAPSULE | Freq: Two times a day (BID) | ORAL | 0 refills | Status: DC

## 2017-05-14 MED ORDER — METHOCARBAMOL 500 MG PO TABS: 500.0000 mg | ORAL_TABLET | Freq: Three times a day (TID) | ORAL | 0 refills | Status: DC

## 2017-05-14 MED ORDER — PREDNISONE 20 MG PO TABS: 60.0000 mg | ORAL_TABLET | Freq: Every day | ORAL | 0 refills | Status: AC

## 2017-05-14 MED FILL — predniSONE 20 MG TABS: 20 | 5 days supply | Qty: 15 | Fill #0

## 2017-05-14 MED FILL — ?METOPROLOL 25 MG TABLET: 25 | 30 days supply | Qty: 60 | Fill #0

## 2017-05-14 MED FILL — ATORVASTATIN 40 MG TABLET: 40 | 30 days supply | Qty: 30 | Fill #0

## 2017-05-14 MED FILL — ?GLIPIZIDE 10 MG TABLET: 10 | 30 days supply | Qty: 60 | Fill #0

## 2017-05-14 MED FILL — GABAPENTIN 300 MG CAPSULE: 300 | 30 days supply | Qty: 60 | Fill #0

## 2017-05-14 MED FILL — LOSARTAN POTASSIUM 50 MG TA: 50 | 30 days supply | Qty: 60 | Fill #0

## 2017-05-14 MED FILL — METHOCARBAMOL 500 MG TABLET: 500 | 20 days supply | Qty: 60 | Fill #0

## 2017-05-14 NOTE — Addendum Note (Signed)
Addended by: Scot Jun on: 05/14/2017 03:20 PM   Modules accepted: Orders

## 2017-05-14 NOTE — Progress Notes (Signed)
Patient ID: Jon Harper, male    DOB: 1955-07-31, 62 y.o.   MRN: [id number]  PCP: Scot Jun, FNP  Chief Complaint  Patient presents with  . Urinary Frequency  . Elevated PSA    Subjective:  HPI Jon Harper is a 62 y.o. 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 Jon Harper complains today of urinary frequency and during his last office visit a PSA level resulted as mildly elevated 4.1. He denies known history of BPH and reports that he has never had a prostate/rectal exam. He complains of increased urinary pressure with frequency. He complains of back pain however uncertain that this is associated with urinary symptoms. Feels that urine stream slightly weak  and that he can never completely empty his bladder. Denies family history of prostate cancer although he has a personal history of renal cell carcinoma. Social History   Socioeconomic History  . Marital status: Married    Spouse name: Not on file  . Number of children: Not on file  . Years of education: Not on file  . Highest education level: Not on file  Social Needs  . Financial resource strain: Not on file  . Food insecurity - worry: Not on file  . Food insecurity - inability: Not on file  . Transportation needs - medical: Not on file  . Transportation needs - non-medical: Not on file  Occupational History  . Not on file  Tobacco Use  . Smoking status: Never Smoker  . Smokeless tobacco: Never Used  Substance and Sexual Activity  . Alcohol use: No    Frequency: Never  . Drug use: No  . Sexual activity: Not on file  Other Topics Concern  . Not on file  Social History Narrative  . Not on file    Family History  Problem Relation Age of Onset  . Diabetes Mother        non-insulin   . Diabetes Father        non-insulin   . Diabetes Sister    non-insulin    Review of Systems Pertinent negatives included in HPI Patient Active Problem List   Diagnosis Date Noted  . Type 2 diabetes mellitus without complication, without long-term current use of insulin ([place]) 04/16/2017  . Abnormal EKG 04/16/2017  . Hypertension 04/16/2017  . Hyperlipidemia 04/16/2017  . Hx of renal cell cancer 04/16/2017  . Stage 3 chronic kidney disease ([place]) 04/16/2017  . History of unilateral nephrectomy 04/16/2017    No Known Allergies  Prior to Admission medications   Medication Sig Start Date End Date Taking? Authorizing Provider  atorvastatin (LIPITOR) 40 MG tablet Take 0.5 tablets (20 mg total) by mouth daily. 04/22/17  Yes Scot Jun, FNP  gabapentin (NEURONTIN) 300 MG capsule Take 1 capsule (300 mg total) by mouth 2 (two) times daily. 04/16/17  Yes Scot Jun, FNP  glipiZIDE (GLUCOTROL) 10 MG tablet Take 1 tablet (10 mg total) by mouth 2 (two) times daily before a meal. 04/16/17  Yes Scot Jun, FNP  Iron-Vitamins (GERITOL PO) Take 1 tablet by mouth daily.   Yes [provider]  losartan (COZAAR) 50 MG tablet Take 2 tablets (100 mg total) by mouth daily. 04/16/17  Yes Scot Jun, FNP  metoprolol tartrate (LOPRESSOR) 25 MG tablet Take 1 tablet (25 mg total) by mouth 2 (two) times daily.  04/16/17  Yes Scot Jun, FNP  methocarbamol (ROBAXIN) 500 MG tablet Take 1 tablet (500 mg total) by mouth 2 (two) times daily. Patient not taking: Reported on 04/16/2017 12/21/15   Carmelina Dane    Past Medical, Surgical Family and Social History reviewed and updated.    Objective:   Today's Vitals   05/14/17 1429  BP: 122/76  Pulse: 100  Resp: 16  Temp: 99 F (37.2 C)  TempSrc: Oral  SpO2: 98%  Weight: 245 lb (111.1 kg)  Height: 5\' 11"  (1.803 m)    Wt Readings from Last 3 Encounters:  05/14/17 245 lb (111.1 kg)  04/16/17 241 lb (109.3 kg)  04/02/17 250 lb (113.4 kg)    Physical Exam  Constitutional:  He is oriented to person, place, and time. He appears well-developed and well-nourished.  HENT:  Head: Normocephalic and atraumatic.  Right Ear: External ear normal.  Left Ear: External ear normal.  Nose: Nose normal.  Mouth/Throat: Oropharynx is clear and moist.  Eyes: Conjunctivae and EOM are normal. Pupils are equal, round, and reactive to light.  Neck: Normal range of motion. Neck supple.  Cardiovascular: Normal rate, regular rhythm, normal heart sounds and intact distal pulses.  Pulmonary/Chest: Effort normal and breath sounds normal.  Genitourinary: Rectal exam shows guaiac positive stool. Rectal exam shows no external hemorrhoid, no internal hemorrhoid and anal tone normal. Prostate is enlarged.  Musculoskeletal: Normal range of motion.  Lymphadenopathy:    He has no cervical adenopathy.  Neurological: He is alert and oriented to person, place, and time.  Skin: Skin is warm and dry.  Psychiatric: He has a normal mood and affect. His behavior is normal. Judgment and thought content normal.   Assessment & Plan:  1. Increased urinary frequency 2. Elevated PSA, less than 10 ng/m 3. Benign prostatic hyperplasia, unspecified whether lower urinary tract symptoms present Prostate is slightly enlarged in the presence of an elevated PSA. Will repeat PSA again today. If level remains elevated will refer to urology as patient has risk factors for prostate cancer including AA male, age, and history of renal cell cancer.   Requested medication refilled.  Meds ordered this encounter  Medications  . predniSONE (DELTASONE) 20 MG tablet    Sig: Take 3 tablets (60 mg total) by mouth daily with breakfast for 5 days.    Dispense:  15 tablet    Refill:  0    Order Specific Question:   Supervising Provider    Answer:   Tresa Garter [id number]  . metoprolol tartrate (LOPRESSOR) 25 MG tablet    Sig: Take 1 tablet (25 mg total) by mouth 2 (two) times daily.    Dispense:  180 tablet     Refill:  0    Order Specific Question:   Supervising Provider    Answer:   Tresa Garter [id number]  . losartan (COZAAR) 50 MG tablet    Sig: Take 2 tablets (100 mg total) by mouth daily.    Dispense:  90 tablet    Refill:  1    Order Specific Question:   Supervising Provider    Answer:   Tresa Garter [id number]  . glipiZIDE (GLUCOTROL) 10 MG tablet    Sig: Take 1 tablet (10 mg total) by mouth 2 (two) times daily before a meal.    Dispense:  180 tablet    Refill:  0    Order Specific Question:   Supervising Provider    Answer:  JEGEDE, OLUGBEMIGA E [id number]  . gabapentin (NEURONTIN) 300 MG capsule    Sig: Take 1 capsule (300 mg total) by mouth 2 (two) times daily.    Dispense:  180 capsule    Refill:  0    Order Specific Question:   Supervising Provider    Answer:   Tresa Garter [id number]  . atorvastatin (LIPITOR) 40 MG tablet    Sig: Take 1 tablet (40 mg total) by mouth daily.    Dispense:  90 tablet    Refill:  1    Order Specific Question:   Supervising Provider    Answer:   Tresa Garter [id number]  . methocarbamol (ROBAXIN) 500 MG tablet    Sig: Take 1 tablet (500 mg total) by mouth 3 (three) times daily.    Dispense:  60 tablet    Refill:  0    Order Specific Question:   Supervising Provider    Answer:   Tresa Garter [id number]  . tamsulosin (FLOMAX) 0.4 MG CAPS capsule    Sig: Take 1 capsule (0.4 mg total) by mouth daily.    Dispense:  30 capsule    Refill:  3    Order Specific Question:   Supervising Provider    Answer:   Tresa Garter [id number]   Orders Placed This Encounter  Procedures  . Flu Vaccine QUAD 6+ mos PF IM (Fluarix Quad PF)  . Ambulatory referral to Urology    Referral Priority:   Routine    Referral Type:   Consultation    Referral Reason:   Specialty Services Required    Requested Specialty:   Urology    Number of Visits Requested:   3  . POCT urinalysis dip (device)     RTC: 6 months for chronic  condition follow-up.   Carroll Sage. Kenton Kingfisher, MSN, FNP-C [facility]  [address] [place], [place] [address] [phone]

## 2017-05-14 NOTE — Patient Instructions (Addendum)
You will be contacted by [facility] in [facility].   I am starting you on Flomax to help with BPH symptoms.  I will refer you to urology if PSA level remains elevated or symptoms of urinary pressure and frequency doesn't improve.    Benign Prostatic Hyperplasia Benign prostatic hyperplasia (BPH) is an enlarged prostate gland that is caused by the normal aging process and not by cancer. The prostate is a walnut-sized gland that is involved in the production of semen. It is located in front of the rectum and below the bladder. The bladder stores urine and the urethra is the tube that carries the urine out of the body. The prostate may get bigger as a man gets older. An enlarged prostate can press on the urethra. This can make it harder to pass urine. The build-up of urine in the bladder can cause infection. Back pressure and infection may progress to bladder damage and kidney (renal) failure. What are the causes? This condition is part of a normal aging process. However, not all men develop problems from this condition. If the prostate enlarges away from the urethra, urine flow will not be blocked. If it enlarges toward the urethra and compresses it, there will be problems passing urine. What increases the risk? This condition is more likely to develop in men over the age of 36 years. What are the signs or symptoms? Symptoms of this condition include:  Getting up often during the night to urinate.  Needing to urinate frequently during the day.  Difficulty starting urine flow.  Decrease in size and strength of your urine stream.  Leaking (dribbling) after urinating.  Inability to pass urine. This needs immediate treatment.  Inability to completely empty your bladder.  Pain when you pass urine. This is more common if there is also an infection.  Urinary tract infection (UTI).  How is this diagnosed? This condition is diagnosed based on your medical history, a physical  exam, and your symptoms. Tests will also be done, such as:  A post-void bladder scan. This measures any amount of urine that may remain in your bladder after you finish urinating.  A digital rectal exam. In a rectal exam, your health care provider checks your prostate by putting a lubricated, gloved finger into your rectum to feel the back of your prostate gland. This exam detects the size of your gland and any abnormal lumps or growths.  An exam of your urine (urinalysis).  A prostate specific antigen (PSA) screening. This is a blood test used to screen for prostate cancer.  An ultrasound. This test uses sound waves to electronically produce a picture of your prostate gland.  Your health care provider may refer you to a specialist in kidney and prostate diseases (urologist). How is this treated? Once symptoms begin, your health care provider will monitor your condition (active surveillance or watchful waiting). Treatment for this condition will depend on the severity of your condition. Treatment may include:  Observation and yearly exams. This may be the only treatment needed if your condition and symptoms are mild.  Medicines to relieve your symptoms, including: ? Medicines to shrink the prostate. ? Medicines to relax the muscle of the prostate.  Surgery in severe cases. Surgery may include: ? Prostatectomy. In this procedure, the prostate tissue is removed completely through an open incision or with a laparascope or robotics. ? Transurethral resection of the prostate (TURP). In this procedure, a tool is inserted through the opening at the tip of  the penis (urethra). It is used to cut away tissue of the inner core of the prostate. The pieces are removed through the same opening of the penis. This removes the blockage. ? Transurethral incision (TUIP). In this procedure, small cuts are made in the prostate. This lessens the prostate's pressure on the urethra. ? Transurethral microwave  thermotherapy (TUMT). This procedure uses microwaves to create heat. The heat destroys and removes a small amount of prostate tissue. ? Transurethral needle ablation (TUNA). This procedure uses radio frequencies to destroy and remove a small amount of prostate tissue. ? Interstitial laser coagulation ([place]). This procedure uses a laser to destroy and remove a small amount of prostate tissue. ? Transurethral electrovaporization (TUVP). This procedure uses electrodes to destroy and remove a small amount of prostate tissue. ? Prostatic urethral lift. This procedure inserts an implant to push the lobes of the prostate away from the urethra.  Follow these instructions at home:  Take over-the-counter and prescription medicines only as told by your health care provider.  Monitor your symptoms for any changes. Contact your health care provider with any changes.  Avoid drinking large amounts of liquid before going to bed or out in public.  Avoid or reduce how much caffeine or alcohol you drink.  Give yourself time when you urinate.  Keep all follow-up visits as told by your health care provider. This is important. Contact a health care provider if:  You have unexplained back pain.  Your symptoms do not get better with treatment.  You develop side effects from the medicine you are taking.  Your urine becomes very dark or has a bad smell.  Your lower abdomen becomes distended and you have trouble passing your urine. Get help right away if:  You have a fever or chills.  You suddenly cannot urinate.  You feel lightheaded, or very dizzy, or you faint.  There are large amounts of blood or clots in the urine.  Your urinary problems become hard to manage.  You develop moderate to severe low back or flank pain. The flank is the side of your body between the ribs and the hip. These symptoms may represent a serious problem that is an emergency. Do not wait to see if the symptoms will go away.  Get medical help right away. Call your local emergency services (911 in the [place] Do not drive yourself to the hospital. Summary  Benign prostatic hyperplasia (BPH) is an enlarged prostate that is caused by the normal aging process and not by cancer.  An enlarged prostate can press on the urethra. This can make it hard to pass urine.  This condition is part of a normal aging process and is more likely to develop in men over the age of 50 years.  Get help right away if you suddenly cannot urinate. This information is not intended to replace advice given to you by your health care provider. Make sure you discuss any questions you have with your health care provider. Document Released: 02/25/2005 Document Revised: 04/01/2016 Document Reviewed: 04/01/2016 Elsevier Interactive Patient Education  [employer].

## 2017-05-15 LAB — PSA: PROSTATE SPECIFIC AG, SERUM: 6.9 ng/mL — AB (ref 0.0–4.0)

## 2017-05-16 ENCOUNTER — Ambulatory Visit: Payer: Self-pay | Admitting: Cardiology

## 2017-05-18 ENCOUNTER — Other Ambulatory Visit: Payer: Self-pay | Admitting: Family Medicine

## 2017-05-19 ENCOUNTER — Encounter: Payer: Self-pay | Admitting: *Deleted

## 2017-05-22 ENCOUNTER — Telehealth: Payer: Self-pay

## 2017-05-24 MED ORDER — TAMSULOSIN HCL 0.4 MG PO CAPS
0.4000 mg | ORAL_CAPSULE | Freq: Every day | ORAL | 3 refills | Status: DC
Start: 2017-05-24 — End: 2018-10-26

## 2017-05-26 MED FILL — ?TAMSULOSIN HCL 0.4 MG CAP: 0.4 | 30 days supply | Qty: 30 | Fill #0

## 2017-05-26 NOTE — Telephone Encounter (Signed)
Advised patient of all.

## 2017-05-26 NOTE — Telephone Encounter (Signed)
-----   Message from Scot Jun, [place] sent at 05/24/2017  1:02 AM EDT ----- Regarding: Medication  [name],  Please contact patient to advise I am prescribing Flomax 0.4 once daily to improve his urinary frequency symptoms. Also he should hear from urology by the end of the week to schedule an appointment for evaluation of his prostate.   Carroll Sage. Kenton Kingfisher, MSN, FNP-C [facility]  [address] [place], [place] [address] [phone]

## 2017-06-30 MED FILL — ATORVASTATIN 40 MG TABLET: 40 | 30 days supply | Qty: 30 | Fill #1

## 2017-06-30 MED FILL — GABAPENTIN 300 MG CAPSULE: 300 | 30 days supply | Qty: 60 | Fill #1

## 2017-06-30 MED FILL — ?TAMSULOSIN HCL 0.4 MG CAP: 0.4 | 30 days supply | Qty: 30 | Fill #1

## 2017-06-30 MED FILL — LOSARTAN POTASSIUM 50 MG TA: 50 | 30 days supply | Qty: 60 | Fill #1

## 2017-06-30 MED FILL — ?GLIPIZIDE 10 MG TABLET: 10 | 30 days supply | Qty: 60 | Fill #1

## 2017-06-30 MED FILL — ?METOPROLOL TARTRATE 25 MG: 25 | 30 days supply | Qty: 60 | Fill #1

## 2017-07-07 ENCOUNTER — Ambulatory Visit: Payer: Self-pay | Admitting: Cardiology

## 2017-07-07 ENCOUNTER — Ambulatory Visit (INDEPENDENT_AMBULATORY_CARE_PROVIDER_SITE_OTHER): Payer: Self-pay | Admitting: Cardiology

## 2017-07-07 ENCOUNTER — Encounter: Payer: Self-pay | Admitting: Cardiology

## 2017-07-07 VITALS — BP 132/78 | HR 102 | Ht 71.0 in | Wt 243.8 lb

## 2017-07-07 DIAGNOSIS — E1169 Type 2 diabetes mellitus with other specified complication: Secondary | ICD-10-CM

## 2017-07-07 DIAGNOSIS — R9431 Abnormal electrocardiogram [ECG] [EKG]: Secondary | ICD-10-CM

## 2017-07-07 DIAGNOSIS — I10 Essential (primary) hypertension: Secondary | ICD-10-CM

## 2017-07-07 DIAGNOSIS — I491 Atrial premature depolarization: Secondary | ICD-10-CM | POA: Insufficient documentation

## 2017-07-07 DIAGNOSIS — E785 Hyperlipidemia, unspecified: Secondary | ICD-10-CM

## 2017-07-07 NOTE — Patient Instructions (Signed)
NO CHANGES WITH MEDICATIONS      WILL SCHEDULE AT [place] Your physician has recommended that you wear a holter monitor 48 HOUR . Holter monitors are medical devices that record the heart's electrical activity. Doctors most often use these monitors to diagnose arrhythmias. Arrhythmias are problems with the speed or rhythm of the heartbeat. The monitor is a small, portable device. You can wear one while you do your normal daily activities. This is usually used to diagnose what is causing palpitations/syncope (passing out).   Your physician recommends that you schedule a follow-up appointment in [place].

## 2017-07-07 NOTE — Progress Notes (Addendum)
PCP: Scot Jun, FNP  Clinic Note: Chief Complaint  Patient presents with  . New Patient (Initial Visit)    pt denies chest pains, SOB. pt does mention having swelling in feet, and states PCP stated his heart sounded irregular    HPI: Jon Harper is a 62 y.o. male who is being seen today for the evaluation of "an irregular heart" at the request of Scot Jun, FNP.  Jon Harper was last seen in the emergency room on June 04, 2017 with foot and ankle edema.  Recent Hospitalizations:   June 04, 2017: Noted right sided back pain radiating to the shoulder chest and abdomen.  Was sharp.  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  Studies Personally Reviewed - (if available, images/films reviewed: From Epic Chart or Care Everywhere)  None  Interval History: Jon Harper presents here today for cardiology evaluation for "irregular heartbeat ".  He himself denies any sensation of rapid irregular heartbeats or palpitations.  He clearly has some skipped beats off and on. Really he is doing relatively well until last year when he had an accident and had a back injury.  Since then he has been having off-and-on pain in his back that can radiate to his chest but is usually worse with certain movements.  He also notes occasional left leg pain is being off and on for the last month or so.  It does not seem to necessarily be associate with walking or not. He denies any real exertional chest discomfort or dyspnea.  No PND orthopnea or edema.  Occasional rapid heartbeats but no real prolonged palpitations, lightheadedness, dizziness, weakness or syncope/near syncope. No TIA/amaurosis fugax symptoms.  No PND, orthopnea or edema.  No melena, hematochezia, hematuria, or epstaxis. No claudication.  ROS: A comprehensive was performed. Review of Systems  Constitutional: Positive for weight loss (on purpose - dieting). Negative for chills, fever and malaise/fatigue.  HENT: Negative  for nosebleeds.   Respiratory: Negative for cough and shortness of breath.   Gastrointestinal: Negative for blood in stool.  Genitourinary: Negative for hematuria.  Musculoskeletal: Positive for joint pain (occ R Knee or ankle) and myalgias (rare leg aching after walking).  Neurological: Negative for dizziness and weakness.  Psychiatric/Behavioral: Negative for memory loss. The patient is not nervous/anxious and does not have insomnia.   All other systems reviewed and are negative.   I have reviewed and (if needed) personally updated the patient's problem list, medications, allergies, past medical and surgical history, social and family history.   Past Medical History:  Diagnosis Date  . Diabetes mellitus   . Hypertension     Past Surgical History:  Procedure Laterality Date  . right nephrectomy      Current Meds  Medication Sig  . atorvastatin (LIPITOR) 40 MG tablet Take 1 tablet (40 mg total) by mouth daily.  Marland Kitchen gabapentin (NEURONTIN) 300 MG capsule Take 1 capsule (300 mg total) by mouth 2 (two) times daily.  Marland Kitchen glipiZIDE (GLUCOTROL) 10 MG tablet Take 1 tablet (10 mg total) by mouth 2 (two) times daily before a meal.  . Iron-Vitamins (GERITOL PO) Take 1 tablet by mouth daily.  Marland Kitchen losartan (COZAAR) 50 MG tablet Take 2 tablets (100 mg total) by mouth daily.  . metoprolol tartrate (LOPRESSOR) 25 MG tablet Take 1 tablet (25 mg total) by mouth 2 (two) times daily.    No Known Allergies  Social History   Tobacco Use  . Smoking status: Never Smoker  .  Smokeless tobacco: Never Used  Substance Use Topics  . Alcohol use: No    Frequency: Never  . Drug use: No   Social History   Social History Narrative   Divorced.   Does exercise as much as possible.  Tries to go to gym at least 3 days a week.    family history includes Diabetes in his father, mother, and sister.  Wt Readings from Last 3 Encounters:  07/07/17 243 lb 12.8 oz (110.6 kg)  05/14/17 245 lb (111.1 kg)    04/16/17 241 lb (109.3 kg)    PHYSICAL EXAM BP 132/78 (BP Location: Left Arm)   Pulse (!) 102   Ht 5\' 11"  (1.803 m)   Wt 243 lb 12.8 oz (110.6 kg)   BMI 34.00 kg/m  Physical Exam  Constitutional: He is oriented to person, place, and time. He appears well-developed and well-nourished. No distress.  Well-groomed.  Healthy-appearing  Eyes: Pupils are equal, round, and reactive to light. EOM are normal. No scleral icterus.  Neck: Normal range of motion. Neck supple. No JVD present.  Cardiovascular: Normal rate, regular rhythm, normal heart sounds and intact distal pulses.  Pulmonary/Chest: Effort normal and breath sounds normal. No respiratory distress. He has no wheezes. He has no rales.  Abdominal: Soft. Bowel sounds are normal. He exhibits no distension. There is no tenderness. There is no rebound.  Musculoskeletal: Normal range of motion. He exhibits edema.  Neurological: He is alert and oriented to person, place, and time.  Skin: Skin is warm and dry.  Psychiatric: He has a normal mood and affect. His behavior is normal. Judgment and thought content normal.  Vitals reviewed.     Adult ECG Report  Rate: 89;  Rhythm: Unusual P wave axis suggest possible junctional/ectopic tachycardia -otherwise normal axis, intervals and durations.;   Narrative Interpretation: Abnormal EKG with difficult to assess T wave morphology.   Other studies Reviewed: Additional studies/ records that were reviewed today include:  Recent Labs:   Lab Results  Component Value Date   CREATININE 1.48 (H) 04/02/2017   BUN 30 (H) 04/02/2017   NA 139 04/02/2017   K 4.3 04/02/2017   CL 105 04/02/2017   CO2 27 04/02/2017   Lab Results  Component Value Date   CHOL 206 (H) 04/16/2017   HDL 60 04/16/2017   LDLCALC 133 (H) 04/16/2017   TRIG 66 04/16/2017   CHOLHDL 3.4 04/16/2017    ASSESSMENT / PLAN: Problem List Items Addressed This Visit    PAC (premature atrial contraction)    Clearly there are  some PACs on his EKG, with what appears to be an ectopic junctional rhythm.  We will have him wear a 48-hour monitor in order to tell if he has any true sinus rhythm or if this is his baseline rhythm.  Looking for any PAT or PSVT.  Continue beta-blocker.  Depending on what we see may consider echocardiogram.      Relevant Orders   EKG 12-Lead (Completed)   HOLTER MONITOR - 31 HOUR   Hyperlipidemia associated with type 2 diabetes mellitus (HCC) (Chronic)    Most recent labs have an LDL of 133.  Depending on what further work-up looks like, he may need more aggressive control.  He says is been on his current dose of atorvastatin for a long time we may be not fully covering it.      Essential hypertension (Chronic)     well-controlled on current meds.  On combination of beta-blocker plus  ARB.      Abnormal EKG - Primary    Unusual P wave axis.  Depending what monitor shows, probably need to consider structural evaluation with possible echo.      Relevant Orders   EKG 12-Lead (Completed)   HOLTER MONITOR - 48 HOUR       I spent a total of 40 minutes with the patient and chart review. >  50% of the time was spent in direct patient consultation.   Current medicines are reviewed at length with the patient today.  (+/- concerns) n/a The following changes have been made:  none  Patient Instructions  NO CHANGES WITH MEDICATIONS      WILL SCHEDULE AT [place] [address] Your physician has recommended that you wear a holter monitor 48 HOUR . Holter monitors are medical devices that record the heart's electrical activity. Doctors most often use these monitors to diagnose arrhythmias. Arrhythmias are problems with the speed or rhythm of the heartbeat. The monitor is a small, portable device. You can wear one while you do your normal daily activities. This is usually used to diagnose what is causing palpitations/syncope (passing out).   Your physician recommends that you  schedule a follow-up appointment in [place].        Studies Ordered:   Orders Placed This Encounter  Procedures  . HOLTER MONITOR - 48 HOUR  . EKG 12-Lead      Glenetta Hew, M.D., M.S. Interventional Cardiologist   Pager # [phone] Phone # [phone] [address] [place], [place] [address]   Thank you for choosing [facility] at [facility]!!

## 2017-07-09 ENCOUNTER — Telehealth: Payer: Self-pay | Admitting: Cardiology

## 2017-07-09 NOTE — Telephone Encounter (Signed)
-----   Message -----  From: Jennefer Bravo  Sent: 07/09/2017 10:27 AM  To: Corinna Lines  Subject: RE: monitor                    There are no papers to fill out for a holter monitor. There is only a reduced cost self pay through [employer] which would cost the patient $70. If paid to [employer] within 7 days. Patient needs to be notified of cost arrangement and schedule for holter with self pay/ patient notified of cost under scheduling note.  Thanks, [employer]

## 2017-07-14 ENCOUNTER — Encounter: Payer: Self-pay | Admitting: Cardiology

## 2017-07-14 NOTE — Assessment & Plan Note (Signed)
Most recent labs have an LDL of 133.  Depending on what further work-up looks like, he may need more aggressive control.  He says is been on his current dose of atorvastatin for a long time we may be not fully covering it.

## 2017-07-14 NOTE — Assessment & Plan Note (Signed)
Unusual P wave axis.  Depending what monitor shows, probably need to consider structural evaluation with possible echo.

## 2017-07-14 NOTE — Assessment & Plan Note (Signed)
Clearly there are some PACs on his EKG, with what appears to be an ectopic junctional rhythm.  We will have him wear a 48-hour monitor in order to tell if he has any true sinus rhythm or if this is his baseline rhythm.  Looking for any PAT or PSVT.  Continue beta-blocker.  Depending on what we see may consider echocardiogram.

## 2017-07-14 NOTE — Assessment & Plan Note (Signed)
well-controlled on current meds.  On combination of beta-blocker plus ARB.

## 2017-07-29 MED FILL — ?ATORVASTATIN 40MG TABLET: 40 | 30 days supply | Qty: 30 | Fill #2

## 2017-07-29 MED FILL — ?METOPROLOL TARTRATE 25 MG: 25 | 30 days supply | Qty: 60 | Fill #2

## 2017-07-29 MED FILL — ?TAMSULOSIN HCL 0.4 MG CAP: 0.4 | 30 days supply | Qty: 30 | Fill #2

## 2017-07-29 MED FILL — ?GLIPIZIDE 10 MG TABLET: 10 | 30 days supply | Qty: 60 | Fill #2

## 2017-07-29 MED FILL — LOSARTAN POTASSIUM 50 MG TA: 50 | 30 days supply | Qty: 60 | Fill #2

## 2017-09-08 MED FILL — METOPROLOL TARTRATE 25 MG T: 25 | 30 days supply | Qty: 60 | Fill #0

## 2017-09-08 MED FILL — ?GLIPIZIDE 10 MG TABLET: 10 | 30 days supply | Qty: 60 | Fill #0

## 2017-09-08 MED FILL — LOSARTAN POTASSIUM 50 MG TA: 50 | 30 days supply | Qty: 60 | Fill #1

## 2017-09-23 ENCOUNTER — Ambulatory Visit (INDEPENDENT_AMBULATORY_CARE_PROVIDER_SITE_OTHER): Payer: Self-pay | Admitting: Family Medicine

## 2017-09-23 ENCOUNTER — Encounter: Payer: Self-pay | Admitting: Family Medicine

## 2017-09-23 VITALS — BP 128/86 | HR 80 | Temp 98.2°F | Ht 71.0 in | Wt 245.0 lb

## 2017-09-23 DIAGNOSIS — I10 Essential (primary) hypertension: Secondary | ICD-10-CM

## 2017-09-23 DIAGNOSIS — R8290 Unspecified abnormal findings in urine: Secondary | ICD-10-CM

## 2017-09-23 DIAGNOSIS — G629 Polyneuropathy, unspecified: Secondary | ICD-10-CM

## 2017-09-23 DIAGNOSIS — Z09 Encounter for follow-up examination after completed treatment for conditions other than malignant neoplasm: Secondary | ICD-10-CM

## 2017-09-23 DIAGNOSIS — E119 Type 2 diabetes mellitus without complications: Secondary | ICD-10-CM

## 2017-09-23 DIAGNOSIS — K219 Gastro-esophageal reflux disease without esophagitis: Secondary | ICD-10-CM

## 2017-09-23 DIAGNOSIS — E785 Hyperlipidemia, unspecified: Secondary | ICD-10-CM

## 2017-09-23 DIAGNOSIS — N400 Enlarged prostate without lower urinary tract symptoms: Secondary | ICD-10-CM

## 2017-09-23 LAB — POCT URINALYSIS DIP (MANUAL ENTRY)
Bilirubin, UA: NEGATIVE
Glucose, UA: NEGATIVE mg/dL
Ketones, POC UA: NEGATIVE mg/dL
Leukocytes, UA: NEGATIVE
Nitrite, UA: NEGATIVE
Protein Ur, POC: 30 mg/dL — AB
Spec Grav, UA: 1.01 (ref 1.010–1.025)
Urobilinogen, UA: 0.2 E.U./dL
pH, UA: 5.5 (ref 5.0–8.0)

## 2017-09-23 LAB — POCT GLYCOSYLATED HEMOGLOBIN (HGB A1C): Hemoglobin A1C: 5.9 % — AB (ref 4.0–5.6)

## 2017-09-23 MED ORDER — GABAPENTIN 300 MG PO CAPS: 300.0000 mg | ORAL_CAPSULE | Freq: Two times a day (BID) | ORAL | 2 refills | Status: DC

## 2017-09-23 MED ORDER — ATORVASTATIN CALCIUM 40 MG PO TABS: 40.0000 mg | ORAL_TABLET | Freq: Every day | ORAL | 2 refills | Status: DC

## 2017-09-23 MED ORDER — METOPROLOL TARTRATE 25 MG PO TABS: 25.0000 mg | ORAL_TABLET | Freq: Two times a day (BID) | ORAL | 2 refills | Status: DC

## 2017-09-23 MED ORDER — LOSARTAN POTASSIUM 50 MG PO TABS: 100.0000 mg | ORAL_TABLET | Freq: Every day | ORAL | 2 refills | Status: DC

## 2017-09-23 MED ORDER — GLIPIZIDE 10 MG PO TABS: 10.0000 mg | ORAL_TABLET | Freq: Two times a day (BID) | ORAL | 2 refills | Status: DC

## 2017-09-23 MED ORDER — HYDROCORTISONE 0.5 % EX CREA: 1.0000 "application " | TOPICAL_CREAM | Freq: Two times a day (BID) | CUTANEOUS | 2 refills | Status: DC

## 2017-09-23 MED ORDER — RANITIDINE HCL 150 MG PO TABS: 150.0000 mg | ORAL_TABLET | Freq: Two times a day (BID) | ORAL | 1 refills | Status: DC

## 2017-09-23 MED FILL — ?ATORVASTATIN 40MG TABLET: 40 | 30 days supply | Qty: 30 | Fill #3

## 2017-09-23 NOTE — Progress Notes (Signed)
Subjective:    Patient ID: Jon Harper, male    DOB: [birth year], 62 y.o.   MRN: [id number]   PCP: Kathe Becton, NP  Chief Complaint  Patient presents with  . Rash    hands,arms,legs    HPI  Jon Harper has a past medical history of Hypertension and Diabetes. He is here today with c/o rash and itching.   Current Status: Since his last office visit. He has c/o rash on extremities. He states that he relocated to a apartment 1 month ago, and has had itching since. He has been using alcohol to provide some relief, but it has been ineffective.   He denies fevers, chills, fatigue, recent infections, weight loss, and night sweats.   He has been having mild visual changes an dizziness.  He has not had any headaches, and falls. No heart palpitations, cough and shortness of breath reported. He reports occasional chest discomfort.   No reports of GI problems such as nausea, vomiting, diarrhea, and constipation. He has no reports of blood in stools, dysuria and hematuria.   No depression or anxiety.   He denies pain today.   Past Medical History:  Diagnosis Date  . Diabetes mellitus   . Hypertension     Family History  Problem Relation Age of Onset  . Diabetes Mother        non-insulin   . Diabetes Father        non-insulin   . Diabetes Sister        non-insulin     Social History   Socioeconomic History  . Marital status: Married    Spouse name: Not on file  . Number of children: Not on file  . Years of education: Not on file  . Highest education level: Not on file  Occupational History  . Not on file  Social Needs  . Financial resource strain: Not on file  . Food insecurity:    Worry: Not on file    Inability: Not on file  . Transportation needs:    Medical: Not on file    Non-medical: Not on file  Tobacco Use  . Smoking status: Never Smoker  . Smokeless tobacco: Never Used  Substance and Sexual Activity  . Alcohol use: No    Frequency: Never  . Drug  use: No  . Sexual activity: Not on file  Lifestyle  . Physical activity:    Days per week: Not on file    Minutes per session: Not on file  . Stress: Not on file  Relationships  . Social connections:    Talks on phone: Not on file    Gets together: Not on file    Attends religious service: Not on file    Active member of club or organization: Not on file    Attends meetings of clubs or organizations: Not on file    Relationship status: Not on file  . Intimate partner violence:    Fear of current or ex partner: Not on file    Emotionally abused: Not on file    Physically abused: Not on file    Forced sexual activity: Not on file  Other Topics Concern  . Not on file  Social History Narrative   Divorced.   Does exercise as much as possible.  Tries to go to gym at least 3 days a week.    Past Surgical History:  Procedure Laterality Date  . right nephrectomy     Immunization History  Administered Date(s) Administered  . Influenza,inj,Quad PF,6+ Mos 05/14/2017    Current Meds  Medication Sig  . atorvastatin (LIPITOR) 40 MG tablet Take 1 tablet (40 mg total) by mouth daily.  Marland Kitchen gabapentin (NEURONTIN) 300 MG capsule Take 1 capsule (300 mg total) by mouth 2 (two) times daily.  Marland Kitchen glipiZIDE (GLUCOTROL) 10 MG tablet Take 1 tablet (10 mg total) by mouth 2 (two) times daily before a meal.  . Iron-Vitamins (GERITOL PO) Take 1 tablet by mouth daily.  Marland Kitchen losartan (COZAAR) 50 MG tablet Take 2 tablets (100 mg total) by mouth daily.  . metoprolol tartrate (LOPRESSOR) 25 MG tablet Take 1 tablet (25 mg total) by mouth 2 (two) times daily.  . [DISCONTINUED] atorvastatin (LIPITOR) 40 MG tablet Take 1 tablet (40 mg total) by mouth daily.  . [DISCONTINUED] gabapentin (NEURONTIN) 300 MG capsule Take 1 capsule (300 mg total) by mouth 2 (two) times daily.  . [DISCONTINUED] glipiZIDE (GLUCOTROL) 10 MG tablet Take 1 tablet (10 mg total) by mouth 2 (two) times daily before a meal.  . [DISCONTINUED]  losartan (COZAAR) 50 MG tablet Take 2 tablets (100 mg total) by mouth daily.  . [DISCONTINUED] metoprolol tartrate (LOPRESSOR) 25 MG tablet Take 1 tablet (25 mg total) by mouth 2 (two) times daily.    No Known Allergies   Ht 5\' 11"  (1.803 m)   Wt 245 lb (111.1 kg)   BMI 34.17 kg/m   Review of Systems  Constitutional: Negative.   HENT: Negative.   Eyes: Negative.   Respiratory: Negative.   Cardiovascular: Negative.   Gastrointestinal: Negative.   Endocrine: Negative.   Genitourinary: Negative.   Musculoskeletal: Negative.   Skin: Positive for rash (itching).  Allergic/Immunologic: Negative.   Neurological: Negative.   Hematological: Negative.   Psychiatric/Behavioral: Negative.    Objective:   Physical Exam  Constitutional: He is oriented to person, place, and time. He appears well-developed and well-nourished.  HENT:  Head: Normocephalic and atraumatic.  Right Ear: External ear normal.  Left Ear: External ear normal.  Nose: Nose normal.  Mouth/Throat: Oropharynx is clear and moist.  Eyes: Pupils are equal, round, and reactive to light. Conjunctivae and EOM are normal.  Neck: Normal range of motion. Neck supple.  Cardiovascular: Normal rate, regular rhythm, normal heart sounds and intact distal pulses.  Pulmonary/Chest: Effort normal and breath sounds normal.  Abdominal: Soft. Bowel sounds are normal.  Musculoskeletal: Normal range of motion.  Neurological: He is alert and oriented to person, place, and time.  Skin: Skin is warm. Capillary refill takes less than 2 seconds. Rash noted.  Psychiatric: He has a normal mood and affect. His behavior is normal. Judgment and thought content normal.  Nursing note and vitals reviewed.  Assessment & Plan:   1. Type 2 diabetes mellitus without complication, without long-term current use of insulin (HCC) Hgb A1c improving at 5.9. From 6.5 pm 04/16/2017.  - POCT glycosylated hemoglobin (Hb A1C) - POCT urinalysis dipstick -  glipiZIDE (GLUCOTROL) 10 MG tablet; Take 1 tablet (10 mg total) by mouth 2 (two) times daily before a meal.  Dispense: 60 tablet; Refill: 2  2. Hypertension, unspecified type Blood pressure is 128/86 today. He will continue Losartan and Metoprolol as prescribed.   - metoprolol tartrate (LOPRESSOR) 25 MG tablet; Take 1 tablet (25 mg total) by mouth 2 (two) times daily.  Dispense: 60 tablet; Refill: 2 - losartan (COZAAR) 50 MG tablet; Take 2 tablets (100 mg total) by mouth daily.  Dispense: 60 tablet; Refill:  2  3. Hyperlipidemia, unspecified hyperlipidemia type Lipid panel stable on 04/16/2017. He will continue Atorvastatin as prescribed.  - atorvastatin (LIPITOR) 40 MG tablet; Take 1 tablet (40 mg total) by mouth daily.  Dispense: 30 tablet; Refill: 2  4. Benign prostatic hyperplasia, unspecified whether lower urinary tract symptoms present He states that he has not been having any problems or concerns with urination. He is currently not taking Flomax at this time. Monitor.   5. Gastroesophageal reflux disease without esophagitis He c/o chest discomfort. We will initiate Zantac as prescribed.  - ranitidine (ZANTAC) 150 MG tablet; Take 1 tablet (150 mg total) by mouth 2 (two) times daily.  Dispense: 60 tablet; Refill: 1  6. Neuropathy Numbness/tingling/pain in feet. He will continue Gabapentin as prescribed.   7. Abnormal urinalysis We will get Urine culture to evaluate further.   8. Follow up He will follow up in 1 month.   Meds ordered this encounter  Medications  . hydrocortisone cream 0.5 %    Sig: Apply 1 application topically 2 (two) times daily.    Dispense:  30 g    Refill:  2  . metoprolol tartrate (LOPRESSOR) 25 MG tablet    Sig: Take 1 tablet (25 mg total) by mouth 2 (two) times daily.    Dispense:  60 tablet    Refill:  2  . losartan (COZAAR) 50 MG tablet    Sig: Take 2 tablets (100 mg total) by mouth daily.    Dispense:  60 tablet    Refill:  2  . glipiZIDE  (GLUCOTROL) 10 MG tablet    Sig: Take 1 tablet (10 mg total) by mouth 2 (two) times daily before a meal.    Dispense:  60 tablet    Refill:  2  . atorvastatin (LIPITOR) 40 MG tablet    Sig: Take 1 tablet (40 mg total) by mouth daily.    Dispense:  30 tablet    Refill:  2  . ranitidine (ZANTAC) 150 MG tablet    Sig: Take 1 tablet (150 mg total) by mouth 2 (two) times daily.    Dispense:  60 tablet    Refill:  1  . gabapentin (NEURONTIN) 300 MG capsule    Sig: Take 1 capsule (300 mg total) by mouth 2 (two) times daily.    Dispense:  60 capsule    Refill:  [place],  MSN, FNP-C [facility] [address] [place], [place] [address] [phone]

## 2017-09-23 NOTE — Patient Instructions (Addendum)
Ranitidine tablets or capsules What is this medicine? RANITIDINE (ra NYE te deen) is a type of antihistamine that blocks the release of stomach acid. It is used to treat stomach or intestinal ulcers. It can relieve ulcer pain and discomfort, and the heartburn from acid reflux. This medicine may be used for other purposes; ask your health care provider or pharmacist if you have questions. COMMON BRAND NAME(S): Acid Reducer, Ranitidine, Taladine, Wal-Zan, Zantac, Zantac 150, Zantac 75 What should I tell my health care provider before I take this medicine? They need to know if you have any of these conditions: -kidney disease -liver disease -porphyria -an unusual or allergic reaction to ranitidine, other medicines, foods, dyes, or preservatives -pregnant or trying to get pregnant -breast-feeding How should I use this medicine? Take this medicine by mouth with a glass of water. Follow the directions on the prescription label. If you only take this medicine once a day, take it at bedtime. Take your medicine at regular intervals. Do not take your medicine more often than directed. Do not stop taking except on your doctor's advice. Talk to your pediatrician regarding the use of this medicine in children. Special care may be needed. Overdosage: If you think you have taken too much of this medicine contact a poison control center or emergency room at once. NOTE: This medicine is only for you. Do not share this medicine with others. What if I miss a dose? If you miss a dose, take it as soon as you can. If it is almost time for your next dose, take only that dose. Do not take double or extra doses. What may interact with this medicine? -atazanavir -delavirdine -gefitinib -glipizide -ketoconazole -midazolam -procainamide -propantheline -triazolam -warfarin This list may not describe all possible interactions. Give your health care provider a list of all the medicines, herbs, non-prescription  drugs, or dietary supplements you use. Also tell them if you smoke, drink alcohol, or use illegal drugs. Some items may interact with your medicine. What should I watch for while using this medicine? Tell your doctor or health care professional if your condition does not start to get better or gets worse. You may need to take this medicine for several days as prescribed before your symptoms get better. Finish the full course of tablets prescribed, even if you feel better. Do not smoke cigarettes or drink alcohol. These increase irritation in your stomach and can lengthen the time it will take for ulcers to heal. Cigarettes and alcohol can also make acid reflux or heartburn worse. If you get black, tarry stools or vomit up what looks like coffee grounds, call your doctor or health care professional at once. You may have a bleeding ulcer. What side effects may I notice from receiving this medicine? Side effects that you should report to your doctor or health care professional as soon as possible: -agitation, nervousness, depression, hallucinations -allergic reactions like skin rash, itching or hives, swelling of the face, lips, or tongue -breast enlargement in both males and females -breathing problems -redness, blistering, peeling or loosening of the skin, including inside the mouth -unusual bleeding or bruising -unusually weak or tired -vomiting -yellowing of the skin or eyes Side effects that usually do not require medical attention (report to your doctor or health care professional if they continue or are bothersome): -constipation or diarrhea -dizziness -headache -nausea This list may not describe all possible side effects. Call your doctor for medical advice about side effects. You may report side effects to FDA  at 1-800-FDA-1088. Where should I keep my medicine? Keep out of the reach of children. Store at room temperature between 15 and 30 degrees C (59 and 86 degrees F). Protect from  light and moisture. Keep container tightly closed. Throw away any unused medicine after the expiration date. NOTE: This sheet is a summary. It may not cover all possible information. If you have questions about this medicine, talk to your doctor, pharmacist, or health care provider.  2018 Elsevier/Gold Standard (2012-06-17 14:50:34) Hydrocortisone skin cream, ointment, lotion, or solution What is this medicine? HYDROCORTISONE (hye droe KOR ti sone) is a corticosteroid. It is used on the skin to reduce swelling, redness, itching, and allergic reactions. This medicine may be used for other purposes; ask your health care provider or pharmacist if you have questions. COMMON BRAND NAME(S): Ala-Cort, Ala-Scalp, Anusol HC, Aqua Glycolic HC, Balneol for Her, Caldecort, Cetacort, Cortaid, Cortaid Advanced, Cortaid Intensive Therapy, Cortaid Sensitive Skin, CortAlo, Corticaine, Corticool, Cortizone, Cortizone-10, Cortizone-10 Cooling Relief, Cortizone-10 Intensive Healing, Cortizone-10 Plus, Dermarest Dricort, Dermarest Eczema, DERMASORB HC Complete, Gly-Cort, Hycort, Hydro Skin, Hydroskin, Hytone, Instacort, Lacticare HC, Locoid, Locoid Lipocream, MiCort-HC, Monistat Complete Care Instant Itch Relief Cream, Neosporin Eczema, NuCort, Nutracort, NuZon, Pandel, Pediaderm HC, Penecort, Preparation H Hydrocortisone, Procto-Kit, Procto-Med HC, Proctocream-HC, Proctosol-HC, Proctozone-HC, Rederm, Sarnol-HC, Scalacort, Scalpicin Anti-Itch, Texacort, Tucks HC, Westcort What should I tell my health care provider before I take this medicine? They need to know if you have any of these conditions: -any active infection -diabetes -large areas of burned or damaged skin -skin wasting or thinning -an unusual or allergic reaction to hydrocortisone, corticosteroids, sulfites, other medicines, foods, dyes, or preservatives -pregnant or trying to get pregnant -breast-feeding How should I use this medicine? This medicine is for  external use only. Do not take by mouth. Follow the directions on the prescription label. Wash your hands before and after use. Apply a thin film of medicine to the affected area. Do not cover with a bandage or dressing unless your doctor or health care professional tells you to. Do not use on healthy skin or over large areas of skin. Do not get this medicine in your eyes. If you do, rinse out with plenty of cool tap water. Do not to use more medicine than prescribed. Do not use your medicine more often than directed or for more than 14 days. Talk to your pediatrician regarding the use of this medicine in children. Special care may be needed. While this drug may be prescribed for children as young as 2 years of age for selected conditions, precautions do apply. Do not use this medicine for the treatment of diaper rash unless directed to do so by your doctor or health care professional. If applying this medicine to the diaper area of a child, do not cover with tight-fitting diapers or plastic pants. This may increase the amount of medicine that passes through the skin and increase the risk of serious side effects. Elderly patients are more likely to have damaged skin through aging, and this may increase side effects. This medicine should only be used for brief periods and infrequently in older patients. Overdosage: If you think you have taken too much of this medicine contact a poison control center or emergency room at once. NOTE: This medicine is only for you. Do not share this medicine with others. What if I miss a dose? If you miss a dose, use it as soon as you can. If it is almost time for your next dose, use   only that dose. Do not use double or extra doses. What may interact with this medicine? Interactions are not expected. Do not use any other skin products on the affected area without asking your doctor or health care professional. This list may not describe all possible interactions. Give your  health care provider a list of all the medicines, herbs, non-prescription drugs, or dietary supplements you use. Also tell them if you smoke, drink alcohol, or use illegal drugs. Some items may interact with your medicine. What should I watch for while using this medicine? Tell your doctor or health care professional if your symptoms do not start to get better within 7 days or if they get worse. Tell your doctor or health care professional if you are exposed to anyone with measles or chickenpox, or if you develop sores or blisters that do not heal properly. What side effects may I notice from receiving this medicine? Side effects that you should report to your doctor or health care professional as soon as possible: -allergic reactions like skin rash, itching or hives, swelling of the face, lips, or tongue -burning feeling on the skin -dark red spots on the skin -infection -lack of healing of skin condition -painful, red, pus filled blisters in hair follicles -thinning of the skin Side effects that usually do not require medical attention (report to your doctor or health care professional if they continue or are bothersome): -dry skin, irritation -unusual increased growth of hair on the face or body This list may not describe all possible side effects. Call your doctor for medical advice about side effects. You may report side effects to FDA at 1-800-FDA-1088. Where should I keep my medicine? Keep out of the reach of children. Store at room temperature between 15 and 30 degrees C (59 and 86 degrees F). Do not freeze. Throw away any unused medicine after the expiration date. NOTE: This sheet is a summary. It may not cover all possible information. If you have questions about this medicine, talk to your doctor, pharmacist, or health care provider.  2018 Elsevier/Gold Standard (2007-07-10 16:08:48)  

## 2017-09-24 MED FILL — HYDROXYZINE PAM 50 MG CAP: 50 | 10 days supply | Qty: 20 | Fill #0

## 2017-09-25 LAB — URINE CULTURE

## 2017-10-14 MED FILL — ?GLIPIZIDE 10 MG TABLET: 10 | 30 days supply | Qty: 60 | Fill #0

## 2017-10-14 MED FILL — ?METOPROLOL TARTRATE 25 MG: 25 | 30 days supply | Qty: 60 | Fill #1

## 2017-10-14 MED FILL — GABAPENTIN 300 MG CAPSULE: 300 | 30 days supply | Qty: 60 | Fill #2

## 2017-10-14 MED FILL — LOSARTAN POTASSIUM 50 MG TA: 50 | 30 days supply | Qty: 60 | Fill #2

## 2017-10-21 ENCOUNTER — Encounter: Payer: Self-pay | Admitting: Family Medicine

## 2017-10-21 ENCOUNTER — Ambulatory Visit (INDEPENDENT_AMBULATORY_CARE_PROVIDER_SITE_OTHER): Payer: Self-pay | Admitting: Family Medicine

## 2017-10-21 VITALS — BP 130/72 | HR 74 | Temp 98.2°F | Ht 71.0 in | Wt 253.0 lb

## 2017-10-21 DIAGNOSIS — G629 Polyneuropathy, unspecified: Secondary | ICD-10-CM

## 2017-10-21 DIAGNOSIS — E119 Type 2 diabetes mellitus without complications: Secondary | ICD-10-CM

## 2017-10-21 DIAGNOSIS — N400 Enlarged prostate without lower urinary tract symptoms: Secondary | ICD-10-CM

## 2017-10-21 DIAGNOSIS — Z09 Encounter for follow-up examination after completed treatment for conditions other than malignant neoplasm: Secondary | ICD-10-CM

## 2017-10-21 DIAGNOSIS — I10 Essential (primary) hypertension: Secondary | ICD-10-CM

## 2017-10-21 DIAGNOSIS — E785 Hyperlipidemia, unspecified: Secondary | ICD-10-CM

## 2017-10-21 DIAGNOSIS — K219 Gastro-esophageal reflux disease without esophagitis: Secondary | ICD-10-CM

## 2017-10-21 LAB — POCT URINALYSIS DIP (MANUAL ENTRY)
Bilirubin, UA: NEGATIVE
Blood, UA: NEGATIVE
Glucose, UA: NEGATIVE mg/dL
Ketones, POC UA: NEGATIVE mg/dL
Leukocytes, UA: NEGATIVE
Nitrite, UA: NEGATIVE
Protein Ur, POC: 30 mg/dL — AB
Spec Grav, UA: 1.015 (ref 1.010–1.025)
Urobilinogen, UA: 0.2 E.U./dL
pH, UA: 6 (ref 5.0–8.0)

## 2017-10-21 NOTE — Patient Instructions (Signed)
Heart-Healthy Eating Plan Heart-healthy meal planning includes:  Limiting unhealthy fats.  Increasing healthy fats.  Making other small dietary changes.  You may need to talk with your doctor or a diet specialist (dietitian) to create an eating plan that is right for you. What types of fat should I choose?  Choose healthy fats. These include olive oil and canola oil, flaxseeds, walnuts, almonds, and seeds.  Eat more omega-3 fats. These include salmon, mackerel, sardines, tuna, flaxseed oil, and ground flaxseeds. Try to eat fish at least twice each week.  Limit saturated fats. ? Saturated fats are often found in animal products, such as meats, butter, and cream. ? Plant sources of saturated fats include palm oil, palm kernel oil, and coconut oil.  Avoid foods with partially hydrogenated oils in them. These include stick margarine, some tub margarines, cookies, crackers, and other baked goods. These contain trans fats. What general guidelines do I need to follow?  Check food labels carefully. Identify foods with trans fats or high amounts of saturated fat.  Fill one half of your plate with vegetables and green salads. Eat 4-5 servings of vegetables per day. A serving of vegetables is: ? 1 cup of raw leafy vegetables. ?  cup of raw or cooked cut-up vegetables. ?  cup of vegetable juice.  Fill one fourth of your plate with whole grains. Look for the word "whole" as the first word in the ingredient list.  Fill one fourth of your plate with lean protein foods.  Eat 4-5 servings of fruit per day. A serving of fruit is: ? One medium whole fruit. ?  cup of dried fruit. ?  cup of fresh, frozen, or canned fruit. ?  cup of 100% fruit juice.  Eat more foods that contain soluble fiber. These include apples, broccoli, carrots, beans, peas, and barley. Try to get 20-30 g of fiber per day.  Eat more home-cooked food. Eat less restaurant, buffet, and fast food.  Limit or avoid  alcohol.  Limit foods high in starch and sugar.  Avoid fried foods.  Avoid frying your food. Try baking, boiling, grilling, or broiling it instead. You can also reduce fat by: ? Removing the skin from poultry. ? Removing all visible fats from meats. ? Skimming the fat off of stews, soups, and gravies before serving them. ? Steaming vegetables in water or broth.  Lose weight if you are overweight.  Eat 4-5 servings of nuts, legumes, and seeds per week: ? One serving of dried beans or legumes equals  cup after being cooked. ? One serving of nuts equals 1 ounces. ? One serving of seeds equals  ounce or one tablespoon.  You may need to keep track of how much salt or sodium you eat. This is especially true if you have high blood pressure. Talk with your doctor or dietitian to get more information. What foods can I eat? Grains Breads, including French, white, pita, wheat, raisin, rye, oatmeal, and Italian. Tortillas that are neither fried nor made with lard or trans fat. Low-fat rolls, including hotdog and hamburger buns and English muffins. Biscuits. Muffins. Waffles. Pancakes. Light popcorn. Whole-grain cereals. Flatbread. Melba toast. Pretzels. Breadsticks. Rusks. Low-fat snacks. Low-fat crackers, including oyster, saltine, matzo, graham, animal, and rye. Rice and pasta, including brown rice and pastas that are made with whole wheat. Vegetables All vegetables. Fruits All fruits, but limit coconut. Meats and Other Protein Sources Lean, well-trimmed beef, veal, pork, and lamb. Chicken and turkey without skin. All fish and shellfish.   Wild duck, rabbit, pheasant, and venison. Egg whites or low-cholesterol egg substitutes. Dried beans, peas, lentils, and tofu. Seeds and most nuts. Dairy Low-fat or nonfat cheeses, including ricotta, string, and mozzarella. Skim or 1% milk that is liquid, powdered, or evaporated. Buttermilk that is made with low-fat milk. Nonfat or low-fat  yogurt. Beverages Mineral water. Diet carbonated beverages. Sweets and Desserts Sherbets and fruit ices. Honey, jam, marmalade, jelly, and syrups. Meringues and gelatins. Pure sugar candy, such as hard candy, jelly beans, gumdrops, mints, marshmallows, and small amounts of dark chocolate. Angel food cake. Eat all sweets and desserts in moderation. Fats and Oils Nonhydrogenated (trans-free) margarines. Vegetable oils, including soybean, sesame, sunflower, olive, peanut, safflower, corn, canola, and cottonseed. Salad dressings or mayonnaise made with a vegetable oil. Limit added fats and oils that you use for cooking, baking, salads, and as spreads. Other Cocoa powder. Coffee and tea. All seasonings and condiments. The items listed above may not be a complete list of recommended foods or beverages. Contact your dietitian for more options. What foods are not recommended? Grains Breads that are made with saturated or trans fats, oils, or whole milk. Croissants. Butter rolls. Cheese breads. Sweet rolls. Donuts. Buttered popcorn. Chow mein noodles. High-fat crackers, such as cheese or butter crackers. Meats and Other Protein Sources Fatty meats, such as hotdogs, short ribs, sausage, spareribs, bacon, rib eye roast or steak, and mutton. High-fat deli meats, such as salami and bologna. Caviar. Domestic duck and goose. Organ meats, such as kidney, liver, sweetbreads, and heart. Dairy Cream, sour cream, cream cheese, and creamed cottage cheese. Whole-milk cheeses, including blue (bleu), Monterey Jack, Brie, [place], American, Havarti, Swiss, cheddar, Camembert, and [place]. Whole or 2% milk that is liquid, evaporated, or condensed. Whole buttermilk. Cream sauce or high-fat cheese sauce. Yogurt that is made from whole milk. Beverages Regular sodas and juice drinks with added sugar. Sweets and Desserts Frosting. Pudding. Cookies. Cakes other than angel food cake. Candy that has milk chocolate or white  chocolate, hydrogenated fat, butter, coconut, or unknown ingredients. Buttered syrups. Full-fat ice cream or ice cream drinks. Fats and Oils Gravy that has suet, meat fat, or shortening. Cocoa butter, hydrogenated oils, palm oil, coconut oil, palm kernel oil. These can often be found in baked products, candy, fried foods, nondairy creamers, and whipped toppings. Solid fats and shortenings, including bacon fat, salt pork, lard, and butter. Nondairy cream substitutes, such as coffee creamers and sour cream substitutes. Salad dressings that are made of unknown oils, cheese, or sour cream. The items listed above may not be a complete list of foods and beverages to avoid. Contact your dietitian for more information. This information is not intended to replace advice given to you by your health care provider. Make sure you discuss any questions you have with your health care provider. Document Released: 08/27/2011 Document Revised: 08/03/2015 Document Reviewed: 08/19/2013 Elsevier Interactive Patient Education  2018 [employer] DASH Eating Plan DASH stands for "Dietary Approaches to Stop Hypertension." The DASH eating plan is a healthy eating plan that has been shown to reduce high blood pressure (hypertension). It may also reduce your risk for type 2 diabetes, heart disease, and stroke. The DASH eating plan may also help with weight loss. What are tips for following this plan? General guidelines  Avoid eating more than 2,300 mg (milligrams) of salt (sodium) a day. If you have hypertension, you may need to reduce your sodium intake to 1,500 mg a day.  Limit alcohol intake to no more   than 1 drink a day for nonpregnant women and 2 drinks a day for men. One drink equals 12 oz of beer, 5 oz of wine, or 1 oz of hard liquor.  Work with your health care provider to maintain a healthy body weight or to lose weight. Ask what an ideal weight is for you.  Get at least 30 minutes of exercise that causes your  heart to beat faster (aerobic exercise) most days of the week. Activities may include walking, swimming, or biking.  Work with your health care provider or diet and nutrition specialist (dietitian) to adjust your eating plan to your individual calorie needs. Reading food labels  Check food labels for the amount of sodium per serving. Choose foods with less than 5 percent of the Daily Value of sodium. Generally, foods with less than 300 mg of sodium per serving fit into this eating plan.  To find whole grains, look for the word "whole" as the first word in the ingredient list. Shopping  Buy products labeled as "low-sodium" or "no salt added."  Buy fresh foods. Avoid canned foods and premade or frozen meals. Cooking  Avoid adding salt when cooking. Use salt-free seasonings or herbs instead of table salt or sea salt. Check with your health care provider or pharmacist before using salt substitutes.  Do not fry foods. Cook foods using healthy methods such as baking, boiling, grilling, and broiling instead.  Cook with heart-healthy oils, such as olive, canola, soybean, or sunflower oil. Meal planning   Eat a balanced diet that includes: ? 5 or more servings of fruits and vegetables each day. At each meal, try to fill half of your plate with fruits and vegetables. ? Up to 6-8 servings of whole grains each day. ? Less than 6 oz of lean meat, poultry, or fish each day. A 3-oz serving of meat is about the same size as a deck of cards. One egg equals 1 oz. ? 2 servings of low-fat dairy each day. ? A serving of nuts, seeds, or beans 5 times each week. ? Heart-healthy fats. Healthy fats called Omega-3 fatty acids are found in foods such as flaxseeds and coldwater fish, like sardines, salmon, and mackerel.  Limit how much you eat of the following: ? Canned or prepackaged foods. ? Food that is high in trans fat, such as fried foods. ? Food that is high in saturated fat, such as fatty  meat. ? Sweets, desserts, sugary drinks, and other foods with added sugar. ? Full-fat dairy products.  Do not salt foods before eating.  Try to eat at least 2 vegetarian meals each week.  Eat more home-cooked food and less restaurant, buffet, and fast food.  When eating at a restaurant, ask that your food be prepared with less salt or no salt, if possible. What foods are recommended? The items listed may not be a complete list. Talk with your dietitian about what dietary choices are best for you. Grains Whole-grain or whole-wheat bread. Whole-grain or whole-wheat pasta. Brown rice. Oatmeal. Quinoa. Bulgur. Whole-grain and low-sodium cereals. Pita bread. Low-fat, low-sodium crackers. Whole-wheat flour tortillas. Vegetables Fresh or frozen vegetables (raw, steamed, roasted, or grilled). Low-sodium or reduced-sodium tomato and vegetable juice. Low-sodium or reduced-sodium tomato sauce and tomato paste. Low-sodium or reduced-sodium canned vegetables. Fruits All fresh, dried, or frozen fruit. Canned fruit in natural juice (without added sugar). Meat and other protein foods Skinless chicken or turkey. Ground chicken or turkey. Pork with fat trimmed off. Fish and seafood. Egg   whites. Dried beans, peas, or lentils. Unsalted nuts, nut butters, and seeds. Unsalted canned beans. Lean cuts of beef with fat trimmed off. Low-sodium, lean deli meat. Dairy Low-fat (1%) or fat-free (skim) milk. Fat-free, low-fat, or reduced-fat cheeses. Nonfat, low-sodium ricotta or cottage cheese. Low-fat or nonfat yogurt. Low-fat, low-sodium cheese. Fats and oils Soft margarine without trans fats. Vegetable oil. Low-fat, reduced-fat, or light mayonnaise and salad dressings (reduced-sodium). Canola, safflower, olive, soybean, and sunflower oils. Avocado. Seasoning and other foods Herbs. Spices. Seasoning mixes without salt. Unsalted popcorn and pretzels. Fat-free sweets. What foods are not recommended? The items listed  may not be a complete list. Talk with your dietitian about what dietary choices are best for you. Grains Baked goods made with fat, such as croissants, muffins, or some breads. Dry pasta or rice meal packs. Vegetables Creamed or fried vegetables. Vegetables in a cheese sauce. Regular canned vegetables (not low-sodium or reduced-sodium). Regular canned tomato sauce and paste (not low-sodium or reduced-sodium). Regular tomato and vegetable juice (not low-sodium or reduced-sodium). Pickles. Olives. Fruits Canned fruit in a light or heavy syrup. Fried fruit. Fruit in cream or butter sauce. Meat and other protein foods Fatty cuts of meat. Ribs. Fried meat. Bacon. Sausage. Bologna and other processed lunch meats. Salami. Fatback. Hotdogs. Bratwurst. Salted nuts and seeds. Canned beans with added salt. Canned or smoked fish. Whole eggs or egg yolks. Chicken or turkey with skin. Dairy Whole or 2% milk, cream, and half-and-half. Whole or full-fat cream cheese. Whole-fat or sweetened yogurt. Full-fat cheese. Nondairy creamers. Whipped toppings. Processed cheese and cheese spreads. Fats and oils Butter. Stick margarine. Lard. Shortening. Ghee. Bacon fat. Tropical oils, such as coconut, palm kernel, or palm oil. Seasoning and other foods Salted popcorn and pretzels. Onion salt, garlic salt, seasoned salt, table salt, and sea salt. Worcestershire sauce. Tartar sauce. Barbecue sauce. Teriyaki sauce. Soy sauce, including reduced-sodium. Steak sauce. Canned and packaged gravies. Fish sauce. Oyster sauce. Cocktail sauce. Horseradish that you find on the shelf. Ketchup. Mustard. Meat flavorings and tenderizers. Bouillon cubes. Hot sauce and Tabasco sauce. Premade or packaged marinades. Premade or packaged taco seasonings. Relishes. Regular salad dressings. Where to find more information:  National Heart, Lung, and Blood Institute: www.nhlbi.nih.gov  American Heart Association: www.heart.org Summary  The DASH  eating plan is a healthy eating plan that has been shown to reduce high blood pressure (hypertension). It may also reduce your risk for type 2 diabetes, heart disease, and stroke.  With the DASH eating plan, you should limit salt (sodium) intake to 2,300 mg a day. If you have hypertension, you may need to reduce your sodium intake to 1,500 mg a day.  When on the DASH eating plan, aim to eat more fresh fruits and vegetables, whole grains, lean proteins, low-fat dairy, and heart-healthy fats.  Work with your health care provider or diet and nutrition specialist (dietitian) to adjust your eating plan to your individual calorie needs. This information is not intended to replace advice given to you by your health care provider. Make sure you discuss any questions you have with your health care provider. Document Released: 02/14/2011 Document Revised: 02/19/2016 Document Reviewed: 02/19/2016 Elsevier Interactive Patient Education  2018 Elsevier Inc.  

## 2017-10-21 NOTE — Progress Notes (Signed)
Follow Up  Subjective:    Patient ID: Jon Harper, male    DOB: 1955/05/07, 62 y.o.   MRN: [id number]   Chief Complaint  Patient presents with  . Follow-up    Chronic condition     HPI  Mr. Spratt has a past medical history of Hypertension and Diabetes. He is here today for follow up.   Current Status: Since his last office visit, he is doing well with no complaints. He states that pain in his feet have gotten better since beginning Gabapentin. He continues to modify his diet. He is taking all medications as prescribed.   He denies fevers, chills, fatigue, recent infections, weight loss, and night sweats.   He has not had any headaches, visual changes, dizziness, and falls.   No chest pain, heart palpitations, cough and shortness of breath reported.   No reports of GI problems such as nausea, vomiting, diarrhea, and constipation. He has no reports of blood in stools, dysuria and hematuria.   No depression or anxiety reported.   He denies pain today.   Past Medical History:  Diagnosis Date  . Diabetes mellitus   . Hypertension     Family History  Problem Relation Age of Onset  . Diabetes Mother        non-insulin   . Diabetes Father        non-insulin   . Diabetes Sister        non-insulin     Social History   Socioeconomic History  . Marital status: Married    Spouse name: Not on file  . Number of children: Not on file  . Years of education: Not on file  . Highest education level: Not on file  Occupational History  . Not on file  Social Needs  . Financial resource strain: Not on file  . Food insecurity:    Worry: Not on file    Inability: Not on file  . Transportation needs:    Medical: Not on file    Non-medical: Not on file  Tobacco Use  . Smoking status: Never Smoker  . Smokeless tobacco: Never Used  Substance and Sexual Activity  . Alcohol use: No    Frequency: Never  . Drug use: No  . Sexual activity: Not on file  Lifestyle  . Physical  activity:    Days per week: Not on file    Minutes per session: Not on file  . Stress: Not on file  Relationships  . Social connections:    Talks on phone: Not on file    Gets together: Not on file    Attends religious service: Not on file    Active member of club or organization: Not on file    Attends meetings of clubs or organizations: Not on file    Relationship status: Not on file  . Intimate partner violence:    Fear of current or ex partner: Not on file    Emotionally abused: Not on file    Physically abused: Not on file    Forced sexual activity: Not on file  Other Topics Concern  . Not on file  Social History Narrative   Divorced.   Does exercise as much as possible.  Tries to go to gym at least 3 days a week.    Past Surgical History:  Procedure Laterality Date  . right nephrectomy      Immunization History  Administered Date(s) Administered  . Influenza,inj,Quad PF,6+ Mos 05/14/2017  Current Meds  Medication Sig  . atorvastatin (LIPITOR) 40 MG tablet Take 1 tablet (40 mg total) by mouth daily.  Marland Kitchen gabapentin (NEURONTIN) 300 MG capsule Take 1 capsule (300 mg total) by mouth 2 (two) times daily.  Marland Kitchen glipiZIDE (GLUCOTROL) 10 MG tablet Take 1 tablet (10 mg total) by mouth 2 (two) times daily before a meal.  . Iron-Vitamins (GERITOL PO) Take 1 tablet by mouth daily.  Marland Kitchen losartan (COZAAR) 50 MG tablet Take 2 tablets (100 mg total) by mouth daily.  . metoprolol tartrate (LOPRESSOR) 25 MG tablet Take 1 tablet (25 mg total) by mouth 2 (two) times daily.    No Known Allergies  BP 130/72 (BP Location: Right Arm, Patient Position: Sitting, Cuff Size: Large)   Pulse 74   Temp 98.2 F (36.8 C) (Oral)   Ht 5\' 11"  (1.803 m)   Wt 253 lb (114.8 kg)   SpO2 98%   BMI 35.29 kg/m    Review of Systems  Constitutional: Negative.   HENT: Negative.   Eyes: Negative.   Respiratory: Negative.   Cardiovascular: Negative.   Gastrointestinal: Negative.   Endocrine:  Negative.   Genitourinary: Negative.   Musculoskeletal: Negative.   Skin: Negative.   Allergic/Immunologic: Negative.   Neurological: Positive for numbness (tingling/pain in feet).  Hematological: Negative.   Psychiatric/Behavioral: Negative.    Objective:   Physical Exam  Constitutional: He is oriented to person, place, and time. He appears well-developed and well-nourished.  HENT:  Head: Normocephalic and atraumatic.  Right Ear: External ear normal.  Left Ear: External ear normal.  Nose: Nose normal.  Mouth/Throat: Oropharynx is clear and moist.  Eyes: Pupils are equal, round, and reactive to light. Conjunctivae and EOM are normal.  Neck: Normal range of motion. Neck supple.  Cardiovascular: Normal rate, regular rhythm, normal heart sounds and intact distal pulses.  Pulmonary/Chest: Effort normal and breath sounds normal.  Abdominal: Soft. He exhibits distension (obese).  Musculoskeletal: Normal range of motion.  Neurological: He is alert and oriented to person, place, and time. A sensory deficit (neuropathy in feet) is present.  Skin: Skin is warm and dry. Capillary refill takes less than 2 seconds.  Psychiatric: He has a normal mood and affect. His behavior is normal. Judgment and thought content normal.  Vitals reviewed.  Assessment & Plan:   1. Type 2 diabetes mellitus without complication, without long-term current use of insulin ([place]) Diabetic medication diet modifications, and increased physical activity are effective. Hgb A1c decreased at 5.9 on 09/23/2017, from 6.5 on 04/16/2017. He will continue Glucotrol as prescribed.  She will continue to decrease foods/beverages high in sugars and carbs and follow Heart Healthy or DASH diet. Increase physical activity to at least 30 minutes cardio exercise daily.   2. Hypertension, unspecified type Antihypertensives are effective. Blood pressure is stable at 130/72 today. He will continue Losartan and Metoprolol as prescribed. He will  continue to decrease high sodium intake, excessive alcohol intake, increase potassium intake, smoking cessation, and increase physical activity of at least 30 minutes of cardio activity daily. He will continue to follow Heart Healthy or DASH diet.  3. Hyperlipidemia, unspecified hyperlipidemia type Lipid panel remains stable on 04/16/2017. He will continue Lipitor as prescribed.   4. Neuropathy Gabapentin is effective. He will continue as prescribed.   5. Benign prostatic hyperplasia, unspecified whether lower urinary tract symptoms present Stable. He is not using Flomax at this time. He will continue to follow up with Urology as needed. Monitor.  6. Gastroesophageal reflux disease without esophagitis Improved. Continue Ranitidine as prescribed.   7. Follow up He will follow up in 3 months.  - POCT urinalysis dipstick  No orders of the defined types were placed in this encounter.   Kathe Becton,  MSN, FNP-C [facility] [address] [place], [place] [address] [phone]

## 2017-11-13 MED FILL — METOPROLOL TARTRATE 25 MG T: 25 | 30 days supply | Qty: 60 | Fill #2

## 2017-11-13 MED FILL — GABAPENTIN 300 MG CAPSULE: 300 | 30 days supply | Qty: 60 | Fill #0

## 2017-11-13 MED FILL — glipiZIDE 10 MG TABS: 10 | 30 days supply | Qty: 60 | Fill #1

## 2017-11-13 MED FILL — LOSARTAN POTASSIUM 50 MG TA: 50 | 30 days supply | Qty: 60 | Fill #0

## 2017-11-17 ENCOUNTER — Ambulatory Visit: Payer: Self-pay

## 2017-11-18 ENCOUNTER — Ambulatory Visit: Payer: Self-pay | Admitting: Family Medicine

## 2018-01-09 MED FILL — METOPROLOL TARTRATE 25 MG T: 25 | 30 days supply | Qty: 60 | Fill #3

## 2018-01-09 MED FILL — glipiZIDE 10 MG TABS: 10 | 30 days supply | Qty: 60 | Fill #2

## 2018-01-09 MED FILL — LOSARTAN POTASSIUM 50 MG TA: 50 | 30 days supply | Qty: 60 | Fill #1

## 2018-01-09 MED FILL — GABAPENTIN 300 MG CAPSULE: 300 | 30 days supply | Qty: 60 | Fill #1

## 2018-01-21 ENCOUNTER — Ambulatory Visit: Payer: Self-pay | Admitting: Family Medicine

## 2018-01-25 ENCOUNTER — Emergency Department (HOSPITAL_COMMUNITY)
Admission: EM | Admit: 2018-01-25 | Discharge: 2018-01-25 | Disposition: A | Discharge status: Home / Self Care | Payer: Self-pay | Attending: Emergency Medicine | Admitting: Emergency Medicine

## 2018-01-25 ENCOUNTER — Other Ambulatory Visit: Payer: Self-pay

## 2018-01-25 ENCOUNTER — Emergency Department (HOSPITAL_BASED_OUTPATIENT_CLINIC_OR_DEPARTMENT_OTHER): Payer: Self-pay

## 2018-01-25 ENCOUNTER — Emergency Department (HOSPITAL_COMMUNITY): Payer: Self-pay

## 2018-01-25 ENCOUNTER — Encounter (HOSPITAL_COMMUNITY): Payer: Self-pay

## 2018-01-25 DIAGNOSIS — E785 Hyperlipidemia, unspecified: Secondary | ICD-10-CM | POA: Insufficient documentation

## 2018-01-25 DIAGNOSIS — Z79899 Other long term (current) drug therapy: Secondary | ICD-10-CM | POA: Insufficient documentation

## 2018-01-25 DIAGNOSIS — G9389 Other specified disorders of brain: Secondary | ICD-10-CM | POA: Insufficient documentation

## 2018-01-25 DIAGNOSIS — M7989 Other specified soft tissue disorders: Secondary | ICD-10-CM

## 2018-01-25 DIAGNOSIS — I129 Hypertensive chronic kidney disease with stage 1 through stage 4 chronic kidney disease, or unspecified chronic kidney disease: Secondary | ICD-10-CM | POA: Insufficient documentation

## 2018-01-25 DIAGNOSIS — R609 Edema, unspecified: Secondary | ICD-10-CM

## 2018-01-25 DIAGNOSIS — M5412 Radiculopathy, cervical region: Secondary | ICD-10-CM | POA: Insufficient documentation

## 2018-01-25 DIAGNOSIS — E119 Type 2 diabetes mellitus without complications: Secondary | ICD-10-CM | POA: Insufficient documentation

## 2018-01-25 DIAGNOSIS — M79602 Pain in left arm: Secondary | ICD-10-CM | POA: Insufficient documentation

## 2018-01-25 DIAGNOSIS — N183 Chronic kidney disease, stage 3 (moderate): Secondary | ICD-10-CM | POA: Insufficient documentation

## 2018-01-25 DIAGNOSIS — R52 Pain, unspecified: Secondary | ICD-10-CM

## 2018-01-25 DIAGNOSIS — Z7984 Long term (current) use of oral hypoglycemic drugs: Secondary | ICD-10-CM | POA: Insufficient documentation

## 2018-01-25 LAB — CBC WITH DIFFERENTIAL/PLATELET
ABS IMMATURE GRANULOCYTES: 0.04 10*3/uL (ref 0.00–0.07)
BASOS PCT: 0 %
Basophils Absolute: 0 10*3/uL (ref 0.0–0.1)
EOS ABS: 0.1 10*3/uL (ref 0.0–0.5)
EOS PCT: 1 %
HCT: 45.4 % (ref 39.0–52.0)
Hemoglobin: 14.9 g/dL (ref 13.0–17.0)
Immature Granulocytes: 0 %
Lymphocytes Relative: 23 %
Lymphs Abs: 2.4 10*3/uL (ref 0.7–4.0)
MCH: 28.3 pg (ref 26.0–34.0)
MCHC: 32.8 g/dL (ref 30.0–36.0)
MCV: 86.1 fL (ref 80.0–100.0)
Monocytes Absolute: 0.9 10*3/uL (ref 0.1–1.0)
Monocytes Relative: 9 %
NEUTROS ABS: 7 10*3/uL (ref 1.7–7.7)
Neutrophils Relative %: 67 %
PLATELETS: 157 10*3/uL (ref 150–400)
RBC: 5.27 MIL/uL (ref 4.22–5.81)
RDW: 13.5 % (ref 11.5–15.5)
WBC: 10.5 10*3/uL (ref 4.0–10.5)
nRBC: 0 % (ref 0.0–0.2)

## 2018-01-25 LAB — COMPREHENSIVE METABOLIC PANEL
ALT: 55 U/L — ABNORMAL HIGH (ref 0–44)
ANION GAP: 10 (ref 5–15)
AST: 24 U/L (ref 15–41)
Albumin: 3.8 g/dL (ref 3.5–5.0)
Alkaline Phosphatase: 50 U/L (ref 38–126)
BUN: 18 mg/dL (ref 6–20)
CHLORIDE: 106 mmol/L (ref 98–111)
CO2: 25 mmol/L (ref 22–32)
Calcium: 9.4 mg/dL (ref 8.9–10.3)
Creatinine, Ser: 1.47 mg/dL — ABNORMAL HIGH (ref 0.61–1.24)
GFR, EST AFRICAN AMERICAN: 58 mL/min — AB (ref >60)
GFR, EST NON AFRICAN AMERICAN: 50 mL/min — AB (ref >60)
Glucose, Bld: 193 mg/dL — ABNORMAL HIGH (ref 70–99)
POTASSIUM: 3.8 mmol/L (ref 3.5–5.1)
SODIUM: 141 mmol/L (ref 135–145)
Total Bilirubin: 0.7 mg/dL (ref 0.3–1.2)
Total Protein: 7.8 g/dL (ref 6.5–8.1)

## 2018-01-25 LAB — CBG MONITORING, ED: GLUCOSE-CAPILLARY: 240 mg/dL — AB (ref 70–99)

## 2018-01-25 LAB — TROPONIN I: Troponin I: <0.03 ng/mL (ref <0.03)

## 2018-01-25 MED ORDER — DEXAMETHASONE SODIUM PHOSPHATE 10 MG/ML IJ SOLN
10.0000 mg | Freq: Once | INTRAMUSCULAR | Status: AC
  Administered 2018-01-25: 10 mg via INTRAVENOUS
  Filled 2018-01-25: qty 1

## 2018-01-25 MED ORDER — HYDROMORPHONE HCL 1 MG/ML IJ SOLN
1.0000 mg | Freq: Once | INTRAMUSCULAR | Status: AC
  Administered 2018-01-25: 1 mg via INTRAVENOUS
  Filled 2018-01-25: qty 1

## 2018-01-25 MED ORDER — METHOCARBAMOL 750 MG PO TABS: 750.0000 mg | ORAL_TABLET | Freq: Three times a day (TID) | ORAL | 0 refills | Status: DC | PRN

## 2018-01-25 MED ORDER — METHYLPREDNISOLONE 4 MG PO TBPK: ORAL_TABLET | ORAL | 0 refills | Status: DC

## 2018-01-25 MED ORDER — HYDROCODONE-ACETAMINOPHEN 5-325 MG PO TABS: 1.0000 | ORAL_TABLET | Freq: Four times a day (QID) | ORAL | 0 refills | Status: DC | PRN

## 2018-01-25 MED ORDER — PREDNISONE 20 MG PO TABS: ORAL_TABLET | ORAL | 0 refills | Status: DC

## 2018-01-25 MED ORDER — HYDROCODONE-ACETAMINOPHEN 5-325 MG PO TABS: 1.0000 | ORAL_TABLET | ORAL | 0 refills | Status: DC | PRN

## 2018-01-25 MED ORDER — METHOCARBAMOL 500 MG PO TABS: 500.0000 mg | ORAL_TABLET | Freq: Three times a day (TID) | ORAL | 0 refills | Status: DC | PRN

## 2018-01-25 NOTE — ED Notes (Signed)
Bed: WTR9 Expected date:  Expected time:  Means of arrival:  Comments: 

## 2018-01-25 NOTE — Progress Notes (Signed)
Preliminary notes--Left upper extremity venous duplex exam completed. Negative for deep and superficial veins thrombosis.   Jahmire Ruffins H Vitoria Conyer(RDMS RVT) 01/25/18 3:25 PM

## 2018-01-25 NOTE — ED Provider Notes (Signed)
[facility] DEPT Provider Note   CSN: [id number] Arrival date & time: 01/25/18  1125     History   Chief Complaint Chief Complaint  Patient presents with  . Arm Pain    HPI Jon Harper is a 62 y.o. male.  HPI   62 year old male with past medical history as below here with arm pain.  The patient states that he awoke from sleep with severe, stabbing, sharp, left arm pain.  The patient states that his pain began upon awakening.  He denies any preceding trauma.  He said some associated neck stiffness in the left lateral side.  The pain shoots down his left arm, and is worse with movement and palpation.  He feels like he cannot move his arm due to pain, though he does not report any weakness.  No numbness.  No fevers, chills, or recent illnesses.  He does use his arms a lot at work.  He also reported a sensation that his left arm is swelling, though denies any redness.  No fevers or chills.  No alleviating factors.  Past Medical History:  Diagnosis Date  . Diabetes mellitus   . Hypertension     Patient Active Problem List   Diagnosis Date Noted  . PAC (premature atrial contraction) 07/07/2017  . Type 2 diabetes mellitus without complication, without long-term current use of insulin ([place]) 04/16/2017  . Abnormal EKG 04/16/2017  . Essential hypertension 04/16/2017  . Hyperlipidemia associated with type 2 diabetes mellitus ([place]) 04/16/2017  . Hx of renal cell cancer 04/16/2017  . Stage 3 chronic kidney disease ([place]) 04/16/2017  . History of unilateral nephrectomy 04/16/2017    Past Surgical History:  Procedure Laterality Date  . right nephrectomy          Home Medications    Prior to Admission medications   Medication Sig Start Date End Date Taking? Authorizing Provider  atorvastatin (LIPITOR) 40 MG tablet Take 1 tablet (40 mg total) by mouth daily. 09/23/17   Azzie Glatter, FNP  gabapentin (NEURONTIN) 300 MG capsule Take 1 capsule  (300 mg total) by mouth 2 (two) times daily. 09/23/17   Azzie Glatter, FNP  glipiZIDE (GLUCOTROL) 10 MG tablet Take 1 tablet (10 mg total) by mouth 2 (two) times daily before a meal. 09/23/17   Azzie Glatter, FNP  HYDROcodone-acetaminophen (NORCO/VICODIN) 5-325 MG tablet Take 1-2 tablets by mouth every 6 (six) hours as needed for moderate pain. 01/25/18   Lajean Saver, MD  hydrocortisone cream 0.5 % Apply 1 application topically 2 (two) times daily. Patient not taking: Reported on 10/21/2017 09/23/17   Azzie Glatter, FNP  Iron-Vitamins (GERITOL PO) Take 1 tablet by mouth daily.    [provider]  losartan (COZAAR) 50 MG tablet Take 2 tablets (100 mg total) by mouth daily. 09/23/17   Azzie Glatter, FNP  methocarbamol (ROBAXIN) 750 MG tablet Take 1 tablet (750 mg total) by mouth 3 (three) times daily as needed (muscle spasm/pain). 01/25/18   Lajean Saver, MD  metoprolol tartrate (LOPRESSOR) 25 MG tablet Take 1 tablet (25 mg total) by mouth 2 (two) times daily. 09/23/17   Azzie Glatter, FNP  predniSONE (DELTASONE) 20 MG tablet 3 po once a day for 2 days, then 2 po once a day for 3 days, then 1 po once a day for 3 days 01/25/18   Lajean Saver, MD  ranitidine (ZANTAC) 150 MG tablet Take 1 tablet (150 mg total) by mouth 2 (  two) times daily. Patient not taking: Reported on 10/21/2017 09/23/17   Azzie Glatter, FNP  tamsulosin (FLOMAX) 0.4 MG CAPS capsule Take 1 capsule (0.4 mg total) by mouth daily. Patient not taking: Reported on 09/23/2017 05/24/17   Scot Jun, FNP    Family History Family History  Problem Relation Age of Onset  . Diabetes Mother        non-insulin   . Diabetes Father        non-insulin   . Diabetes Sister        non-insulin     Social History Social History   Tobacco Use  . Smoking status: Never Smoker  . Smokeless tobacco: Never Used  Substance Use Topics  . Alcohol use: No    Frequency: Never  . Drug use: No     Allergies     Patient has no known allergies.   Review of Systems Review of Systems  Constitutional: Negative for chills, fatigue and fever.  HENT: Negative for congestion and rhinorrhea.   Eyes: Negative for visual disturbance.  Respiratory: Negative for cough, shortness of breath and wheezing.   Cardiovascular: Negative for chest pain and leg swelling.  Gastrointestinal: Negative for abdominal pain, diarrhea, nausea and vomiting.  Genitourinary: Negative for dysuria and flank pain.  Musculoskeletal: Positive for arthralgias and neck pain. Negative for neck stiffness.  Skin: Negative for rash and wound.  Allergic/Immunologic: Negative for immunocompromised state.  Neurological: Negative for syncope, weakness and headaches.  All other systems reviewed and are negative.    Physical Exam Updated Vital Signs BP (!) 147/102   Pulse 85   Temp 98.4 F (36.9 C) (Oral)   Resp 14   Ht 5\' 11"  (1.803 m)   Wt 113.4 kg   SpO2 100%   BMI 34.87 kg/m   Physical Exam  Constitutional: He is oriented to person, place, and time. He appears well-developed and well-nourished. No distress.  HENT:  Head: Normocephalic and atraumatic.  Eyes: Conjunctivae are normal.  Neck: Neck supple.  Mild paraspinal TTP on left. Minimal midline TTP along lower C-Spine. No step-offs or deformity.Positive Spurling test on left.  Cardiovascular: Normal rate, regular rhythm and normal heart sounds. Exam reveals no friction rub.  No murmur heard. Pulmonary/Chest: Effort normal and breath sounds normal. No respiratory distress. He has no wheezes. He has no rales.  Abdominal: He exhibits no distension.  Musculoskeletal: He exhibits no edema.  Neurological: He is alert and oriented to person, place, and time. He exhibits normal muscle tone.  Skin: Skin is warm. Capillary refill takes less than 2 seconds.  Psychiatric: He has a normal mood and affect.  Nursing note and vitals reviewed.   UPPER EXTREMITY EXAM:  LEFT  INSPECTION & PALPATION: Diffuse TTP across LUE, localized primarily along left proximal upper arm. No appreciable warmth or swelling.   SENSORY: Sensation is intact to light touch in:  Superficial radial nerve distribution (dorsal first web space) Median nerve distribution (tip of index finger)   Ulnar nerve distribution (tip of small finger)     MOTOR:  + Motor posterior interosseous nerve (thumb IP extension) + Anterior interosseous nerve (thumb IP flexion, index finger DIP flexion) + Radial nerve (wrist extension) + Median nerve (palpable firing thenar mass) + Ulnar nerve (palpable firing of first dorsal interosseous muscle)  VASCULAR: 2+ radial pulse Brisk capillary refill < 2 sec, fingers warm and well-perfused  COMPARTMENTS: Soft, warm, well-perfused No pain with passive extension No paresthesias    ED  Treatments / Results  Labs (all labs ordered are listed, but only abnormal results are displayed) Labs Reviewed  COMPREHENSIVE METABOLIC PANEL - Abnormal; Notable for the following components:      Result Value   Glucose, Bld 193 (*)    Creatinine, Ser 1.47 (*)    ALT 55 (*)    GFR calc non Af Amer 50 (*)    GFR calc Af Amer 58 (*)    All other components within normal limits  CBG MONITORING, ED - Abnormal; Notable for the following components:   Glucose-Capillary 240 (*)    All other components within normal limits  CBC WITH DIFFERENTIAL/PLATELET  TROPONIN I    EKG EKG Interpretation  Date/Time:  Sunday January 25 2018 11:42:22 EST Ventricular Rate:  87 PR Interval:    QRS Duration: 87 QT Interval:  364 QTC Calculation: 438 R Axis:   29 Text Interpretation:  Sinus rhythm Supraventricular bigeminy Low voltage, precordial leads Minimal ST elevation, anterior leads Baseline wander in lead(s) II III aVL aVF V2 V5 No significant change since last tracing Otherwise no significant change Confirmed by Deno Etienne [phone]) on 01/25/2018 11:56:17  AM   Radiology Dg Forearm Left  Result Date: 01/25/2018 CLINICAL DATA:  Left arm pain and swelling. EXAM: LEFT FOREARM - 2 VIEW COMPARISON:  Left forearm x-rays dated December 29, 2010. FINDINGS: There is no evidence of fracture or other focal bone lesions. Soft tissues are unremarkable. IMPRESSION: Negative. Electronically Signed   By: Titus Dubin M.D.   On: 01/25/2018 14:27   Ct Head Wo Contrast  Result Date: 01/25/2018 CLINICAL DATA:  Left arm pain since yesterday, worse today. EXAM: CT HEAD WITHOUT CONTRAST CT CERVICAL SPINE WITHOUT CONTRAST TECHNIQUE: Multidetector CT imaging of the head and cervical spine was performed following the standard protocol without intravenous contrast. Multiplanar CT image reconstructions of the cervical spine were also generated. COMPARISON:  Head CT 12/29/2010. Cervical spine CT 11/22/2016. FINDINGS: CT HEAD FINDINGS Brain: There is no evidence of acute infarct, intracranial hemorrhage, mass, midline shift, or extra-axial fluid collection. The ventricles are normal. Focal encephalomalacia anteriorly in the right frontal lobe subjacent to a burr hole is unchanged. Vascular: Calcified atherosclerosis at the skull base. No hyperdense vessel. Skull: No fracture or suspicious osseous lesion. Right frontal burr hole. Sinuses/Orbits: Visualized paranasal sinuses and mastoid air cells are clear. Unremarkable orbits. Other: None. CT CERVICAL SPINE FINDINGS Alignment: Cervical spine straightening. No listhesis. Skull base and vertebrae: No acute fracture or destructive osseous process. Soft tissues and spinal canal: No prevertebral fluid or swelling. No visible canal hematoma. Disc levels: Unchanged anterior vertebral spurring at C5-6 and C6-7. Preserved disc space heights. No osseous spinal canal or neural foraminal stenosis. Upper chest: Clear lung apices. Other: Mild calcific atherosclerosis at the left carotid bifurcation. IMPRESSION: 1. No evidence of acute intracranial  abnormality. 2. Unchanged right frontal lobe encephalomalacia. 3. Mild cervical spondylosis without acute osseous abnormality. Electronically Signed   By: Logan Bores M.D.   On: 01/25/2018 14:18   Ct Cervical Spine Wo Contrast  Result Date: 01/25/2018 CLINICAL DATA:  Left arm pain since yesterday, worse today. EXAM: CT HEAD WITHOUT CONTRAST CT CERVICAL SPINE WITHOUT CONTRAST TECHNIQUE: Multidetector CT imaging of the head and cervical spine was performed following the standard protocol without intravenous contrast. Multiplanar CT image reconstructions of the cervical spine were also generated. COMPARISON:  Head CT 12/29/2010. Cervical spine CT 11/22/2016. FINDINGS: CT HEAD FINDINGS Brain: There is no evidence of acute infarct, intracranial  hemorrhage, mass, midline shift, or extra-axial fluid collection. The ventricles are normal. Focal encephalomalacia anteriorly in the right frontal lobe subjacent to a burr hole is unchanged. Vascular: Calcified atherosclerosis at the skull base. No hyperdense vessel. Skull: No fracture or suspicious osseous lesion. Right frontal burr hole. Sinuses/Orbits: Visualized paranasal sinuses and mastoid air cells are clear. Unremarkable orbits. Other: None. CT CERVICAL SPINE FINDINGS Alignment: Cervical spine straightening. No listhesis. Skull base and vertebrae: No acute fracture or destructive osseous process. Soft tissues and spinal canal: No prevertebral fluid or swelling. No visible canal hematoma. Disc levels: Unchanged anterior vertebral spurring at C5-6 and C6-7. Preserved disc space heights. No osseous spinal canal or neural foraminal stenosis. Upper chest: Clear lung apices. Other: Mild calcific atherosclerosis at the left carotid bifurcation. IMPRESSION: 1. No evidence of acute intracranial abnormality. 2. Unchanged right frontal lobe encephalomalacia. 3. Mild cervical spondylosis without acute osseous abnormality. Electronically Signed   By: Logan Bores M.D.   On:  01/25/2018 14:18   Dg Shoulder Left  Result Date: 01/25/2018 CLINICAL DATA:  Left arm pain and swelling.  No injury. EXAM: LEFT SHOULDER - 2+ VIEW; LEFT HUMERUS - 2+ VIEW COMPARISON:  Left shoulder x-rays dated December 03, 2014. FINDINGS: No acute fracture or dislocation. Mild acromioclavicular osteoarthritis. Previous rotator cuff calcific tendinitis has resolved. Bone mineralization is normal. Soft tissues are unremarkable. IMPRESSION: 1. No acute osseous abnormality of the left shoulder and humerus. 2. Mild acromioclavicular osteoarthritis. 3. Resolved rotator cuff calcific tendinitis. Electronically Signed   By: Titus Dubin M.D.   On: 01/25/2018 14:26   Dg Humerus Left  Result Date: 01/25/2018 CLINICAL DATA:  Left arm pain and swelling.  No injury. EXAM: LEFT SHOULDER - 2+ VIEW; LEFT HUMERUS - 2+ VIEW COMPARISON:  Left shoulder x-rays dated December 03, 2014. FINDINGS: No acute fracture or dislocation. Mild acromioclavicular osteoarthritis. Previous rotator cuff calcific tendinitis has resolved. Bone mineralization is normal. Soft tissues are unremarkable. IMPRESSION: 1. No acute osseous abnormality of the left shoulder and humerus. 2. Mild acromioclavicular osteoarthritis. 3. Resolved rotator cuff calcific tendinitis. Electronically Signed   By: Titus Dubin M.D.   On: 01/25/2018 14:26    Procedures Procedures (including critical care time)  Medications Ordered in ED Medications  HYDROmorphone (DILAUDID) injection 1 mg (1 mg Intravenous Given 01/25/18 1307)  HYDROmorphone (DILAUDID) injection 1 mg (1 mg Intravenous Given 01/25/18 1448)  dexamethasone (DECADRON) injection 10 mg (10 mg Intravenous Given 01/25/18 1446)     Initial Impression / Assessment and Plan / ED Course  I have reviewed the triage vital signs and the nursing notes.  Pertinent labs & imaging results that were available during my care of the patient were reviewed by me and considered in my medical decision  making (see chart for details).     62 year old male here with severe left arm pain.  I suspect this is actually secondary to cervical radiculopathy.  He has some reproduction on upon Spurling testing.  He works as a Biomedical scientist and I suspect it could be from how he slept overnight.  He has no red flag symptoms.  No fevers, chills, leukocytosis, or evidence of epidural abscess or infectious etiology.  He reported some swelling and due to his repetitive trauma from cooking, DVT scan obtained and is negative.  His imaging is otherwise unremarkable.  His symptoms are significantly improved with analgesia here.  Will treat him for possible radiculopathy, discharged with outpatient follow-up.  I do not suspect this is referred pain from  pulmonary or cardiac source and he has no chest pain, EKG is nonischemic, and he has no symptoms to suggest dissection or alternative intrathoracic etiology.  Final Clinical Impressions(s) / ED Diagnoses   Final diagnoses:  Cervical radiculopathy  Left arm pain    ED Discharge Orders         Ordered    HYDROcodone-acetaminophen (NORCO/VICODIN) 5-325 MG tablet  Every 4 hours PRN,   Status:  Discontinued     01/25/18 1549    methylPREDNISolone (MEDROL DOSEPAK) 4 MG TBPK tablet  Status:  Discontinued     01/25/18 1549    methocarbamol (ROBAXIN) 500 MG tablet  Every 8 hours PRN,   Status:  Discontinued     01/25/18 1549    methocarbamol (ROBAXIN) 750 MG tablet  3 times daily PRN     01/25/18 1658    predniSONE (DELTASONE) 20 MG tablet     01/25/18 1658    HYDROcodone-acetaminophen (NORCO/VICODIN) 5-325 MG tablet  Every 6 hours PRN     01/25/18 1658           Duffy Bruce, MD 01/25/18 1934

## 2018-01-25 NOTE — ED Triage Notes (Signed)
Pt reports left arm pain since yesterday but woke up this morning and it was worse. Pt denies injury, chest pain, SHOB, or any other symptoms.

## 2018-01-30 ENCOUNTER — Other Ambulatory Visit: Payer: Self-pay

## 2018-01-30 ENCOUNTER — Emergency Department (HOSPITAL_COMMUNITY): Payer: Self-pay

## 2018-01-30 ENCOUNTER — Emergency Department (HOSPITAL_COMMUNITY)
Admission: EM | Admit: 2018-01-30 | Discharge: 2018-01-30 | Disposition: A | Discharge status: Home / Self Care | Payer: Self-pay | Attending: Emergency Medicine | Admitting: Emergency Medicine

## 2018-01-30 ENCOUNTER — Encounter (HOSPITAL_COMMUNITY): Payer: Self-pay

## 2018-01-30 DIAGNOSIS — K8020 Calculus of gallbladder without cholecystitis without obstruction: Secondary | ICD-10-CM | POA: Insufficient documentation

## 2018-01-30 DIAGNOSIS — E119 Type 2 diabetes mellitus without complications: Secondary | ICD-10-CM | POA: Insufficient documentation

## 2018-01-30 DIAGNOSIS — Z79899 Other long term (current) drug therapy: Secondary | ICD-10-CM | POA: Insufficient documentation

## 2018-01-30 DIAGNOSIS — R197 Diarrhea, unspecified: Secondary | ICD-10-CM | POA: Insufficient documentation

## 2018-01-30 DIAGNOSIS — R112 Nausea with vomiting, unspecified: Secondary | ICD-10-CM | POA: Insufficient documentation

## 2018-01-30 DIAGNOSIS — K859 Acute pancreatitis without necrosis or infection, unspecified: Secondary | ICD-10-CM | POA: Insufficient documentation

## 2018-01-30 DIAGNOSIS — R1010 Upper abdominal pain, unspecified: Secondary | ICD-10-CM | POA: Insufficient documentation

## 2018-01-30 DIAGNOSIS — N183 Chronic kidney disease, stage 3 (moderate): Secondary | ICD-10-CM | POA: Insufficient documentation

## 2018-01-30 DIAGNOSIS — I129 Hypertensive chronic kidney disease with stage 1 through stage 4 chronic kidney disease, or unspecified chronic kidney disease: Secondary | ICD-10-CM | POA: Insufficient documentation

## 2018-01-30 LAB — URINALYSIS, ROUTINE W REFLEX MICROSCOPIC
BILIRUBIN URINE: NEGATIVE
Glucose, UA: NEGATIVE mg/dL
HGB URINE DIPSTICK: NEGATIVE
Ketones, ur: NEGATIVE mg/dL
Leukocytes, UA: NEGATIVE
Nitrite: NEGATIVE
PH: 6 (ref 5.0–8.0)
Protein, ur: 30 mg/dL — AB
SPECIFIC GRAVITY, URINE: 1.01 (ref 1.005–1.030)

## 2018-01-30 LAB — CBC
HCT: 41.5 % (ref 39.0–52.0)
Hemoglobin: 14 g/dL (ref 13.0–17.0)
MCH: 28.3 pg (ref 26.0–34.0)
MCHC: 33.7 g/dL (ref 30.0–36.0)
MCV: 84 fL (ref 80.0–100.0)
PLATELETS: 173 10*3/uL (ref 150–400)
RBC: 4.94 MIL/uL (ref 4.22–5.81)
RDW: 13.2 % (ref 11.5–15.5)
WBC: 11.2 10*3/uL — ABNORMAL HIGH (ref 4.0–10.5)
nRBC: 0 % (ref 0.0–0.2)

## 2018-01-30 LAB — COMPREHENSIVE METABOLIC PANEL
ALBUMIN: 3.8 g/dL (ref 3.5–5.0)
ALK PHOS: 43 U/L (ref 38–126)
ALT: 47 U/L — AB (ref 0–44)
ANION GAP: 7 (ref 5–15)
AST: 17 U/L (ref 15–41)
BILIRUBIN TOTAL: 0.6 mg/dL (ref 0.3–1.2)
BUN: 27 mg/dL — AB (ref 6–20)
CO2: 25 mmol/L (ref 22–32)
CREATININE: 1.38 mg/dL — AB (ref 0.61–1.24)
Calcium: 9.1 mg/dL (ref 8.9–10.3)
Chloride: 105 mmol/L (ref 98–111)
GFR calc Af Amer: >60 mL/min (ref >60)
GFR calc non Af Amer: 54 mL/min — ABNORMAL LOW (ref >60)
GLUCOSE: 127 mg/dL — AB (ref 70–99)
Potassium: 3.7 mmol/L (ref 3.5–5.1)
Sodium: 137 mmol/L (ref 135–145)
TOTAL PROTEIN: 7.3 g/dL (ref 6.5–8.1)

## 2018-01-30 LAB — LIPASE, BLOOD: Lipase: 137 U/L — ABNORMAL HIGH (ref 11–51)

## 2018-01-30 MED ORDER — SODIUM CHLORIDE (PF) 0.9 % IJ SOLN
INTRAMUSCULAR | Status: AC
  Filled 2018-01-30: qty 50

## 2018-01-30 MED ORDER — ONDANSETRON 4 MG PO TBDP: ORAL_TABLET | ORAL | 0 refills | Status: DC

## 2018-01-30 MED ORDER — IOPAMIDOL (ISOVUE-300) INJECTION 61%: 100.0000 mL | Freq: Once | INTRAVENOUS | Status: DC | PRN

## 2018-01-30 MED ORDER — IOPAMIDOL (ISOVUE-300) INJECTION 61%
INTRAVENOUS | Status: AC
  Filled 2018-01-30: qty 100

## 2018-01-30 MED ORDER — METOCLOPRAMIDE HCL 5 MG/ML IJ SOLN
10.0000 mg | Freq: Once | INTRAMUSCULAR | Status: AC
  Administered 2018-01-30: 10 mg via INTRAVENOUS
  Filled 2018-01-30: qty 2

## 2018-01-30 MED ORDER — SODIUM CHLORIDE 0.9 % IV BOLUS
1000.0000 mL | Freq: Once | INTRAVENOUS | Status: AC
  Administered 2018-01-30: 1000 mL via INTRAVENOUS

## 2018-01-30 MED ORDER — OXYCODONE-ACETAMINOPHEN 5-325 MG PO TABS: 1.0000 | ORAL_TABLET | Freq: Four times a day (QID) | ORAL | 0 refills | Status: DC | PRN

## 2018-01-30 MED ORDER — DIPHENHYDRAMINE HCL 50 MG/ML IJ SOLN
12.5000 mg | Freq: Once | INTRAMUSCULAR | Status: AC
  Administered 2018-01-30: 12.5 mg via INTRAVENOUS
  Filled 2018-01-30: qty 1

## 2018-01-30 MED ORDER — HYDROMORPHONE HCL 1 MG/ML IJ SOLN
0.5000 mg | Freq: Once | INTRAMUSCULAR | Status: AC
  Administered 2018-01-30: 0.5 mg via INTRAVENOUS
  Filled 2018-01-30: qty 1

## 2018-01-30 NOTE — ED Provider Notes (Signed)
At shift change care assumed from [name], please see her note for full details, but in brief Jon Harper is a 62 y.o. male with presents with generalized abd pain most notable in upper quadrants after eating some bad chicken, labs show mildly elevated lipase, but otherwise reassuring, N/V and pain improved with tx here in the ED, at shift change patient awaiting CT abd/pel given persistent tenderness.  Plan: F/u CT, if neg with any suspicious findings in RUQ get ultrasound, if neg otherwise, home with pain and nausea medications and clear liquid diet for mild pancreatitis and close follow up and return precautions  Labs Reviewed  LIPASE, BLOOD - Abnormal; Notable for the following components:      Result Value   Lipase 137 (*)    All other components within normal limits  COMPREHENSIVE METABOLIC PANEL - Abnormal; Notable for the following components:   Glucose, Bld 127 (*)    BUN 27 (*)    Creatinine, Ser 1.38 (*)    ALT 47 (*)    GFR calc non Af Amer 54 (*)    All other components within normal limits  CBC - Abnormal; Notable for the following components:   WBC 11.2 (*)    All other components within normal limits  URINALYSIS, ROUTINE W REFLEX MICROSCOPIC   Ct Abdomen Pelvis Wo Contrast  Result Date: 01/30/2018 CLINICAL DATA:  Abdominal pain with nausea and vomiting. Elevated white blood cell count EXAM: CT ABDOMEN AND PELVIS WITHOUT CONTRAST TECHNIQUE: Multidetector CT imaging of the abdomen and pelvis was performed following the standard protocol without oral or IV contrast. COMPARISON:  July 01, 2012 FINDINGS: Lower chest: There is chronic rounded atelectasis in the right base posteriorly. Lung bases otherwise are clear. Hepatobiliary: No focal liver lesions are evident on this noncontrast enhanced study. There is cholelithiasis. Gallbladder wall is not appreciably thickened. There is no appreciable biliary duct dilatation. Pancreas: No pancreatic mass or inflammatory focus  evident. Spleen: No splenic lesions are evident. Adrenals/Urinary Tract: Adrenals appear unremarkable bilaterally. Patient is status post right nephrectomy. No lesions seen in the right pararenal fossa. There is compensatory hypertrophy of the left kidney. There is a cyst in the anterior upper to mid left kidney measuring 2.1 x 1.5 cm. There is a cyst arising from the posterior upper pole of the left kidney measuring 1.5 x 1.3 cm. There is a cyst arising from the lateral left kidney midportion measuring 2.6 x 2.6 cm. There is a 1 x 1 cm cyst in the medial mid left kidney. There is no evident hydronephrosis on the left. There is no left renal or ureteral calculus. Urinary bladder is midline with wall thickness within normal limits. Stomach/Bowel: There are sigmoid diverticula without diverticulitis. Mild wall thickening in the sigmoid colon region is likely due to muscular hypertrophy from chronic diverticulosis. There is no appreciable bowel wall or mesenteric thickening. There is no bowel obstruction. There is no free air or portal venous air. Vascular/Lymphatic: There is aortoiliac atherosclerosis. No aneurysm evident. Major mesenteric arterial vessels appear patent on this noncontrast enhanced study. No adenopathy is evident in the abdomen or pelvis. Reproductive: Prostate and seminal vesicles are normal in size and contour. There is no evident pelvic mass. Other: Appendix appears normal. There is no abscess or ascites in the abdomen or pelvis. There is a minimal ventral hernia containing only fat. Musculoskeletal: There is degenerative change in the lower thoracic and lumbar regions. No blastic or lytic bone lesions. No intramuscular  lesions. IMPRESSION: 1. Status post right nephrectomy. Compensatory hypertrophy of left kidney noted. No left-sided renal or ureteral calculus. No hydronephrosis. 2. Cholelithiasis. Gallbladder wall does not appear appreciably thickened by CT. 3. Sigmoid diverticulosis. Evidence of  muscular hypertrophy in sigmoid colon, likely due to chronic diverticulosis. No frank diverticulitis. No bowel obstruction. No evident abscess in the abdomen or pelvis. Appendix appears normal. 4.  Aortoiliac atherosclerosis. 5. Apparent chronic rounded atelectasis in the posterior right base. Aortic Atherosclerosis (ICD10-I70.0). Electronically Signed   By: Lowella Grip III M.D.   On: 01/30/2018 07:08    CT abdomen pelvis shows cholelithiasis without obvious evidence of gallbladder wall thickening, S/P right nephrectomy with expected hypertrophy of the left kidney, sigmoid diverticulosis with no signs of diverticulitis or bowel obstruction, no evidence of abscess, normal appendix.  Chronic atelectasis within the posterior right lung base.  Given findings of cholelithiasis with patient's right upper quadrant tenderness will proceed with ultrasound, especially given mild signs of pancreatitis to assess common bile duct and biliary tree.  Reassured that LFTs are not elevated.  US Abdomen Limited Ruq  Result Date: 01/30/2018 CLINICAL DATA:  Right upper quadrant pain. EXAM: ULTRASOUND ABDOMEN LIMITED RIGHT UPPER QUADRANT COMPARISON:  CT 01/30/2018. FINDINGS: Gallbladder: 1.3 cm gallstone. No gallbladder wall thickening. Gallbladder wall thickness 1.5 mm. Patient on pain medications, Percell Miller sign could not be evaluated. Common bile duct: Diameter: 2.8 mm Liver: No focal lesion identified. Within normal limits in parenchymal echogenicity. Portal vein is patent on color Doppler imaging with normal direction of blood flow towards the liver. Exam slight limited as the patient could not turn fully on to their right side. IMPRESSION: 1. 1.3 cm gallstone. No evidence cholecystitis. No biliary distention. 2.  No acute pathologic. Electronically Signed   By: Marcello Moores  Register   On: 01/30/2018 09:23   Right upper quadrant ultrasound shows 1.3 cm gallstone with no evidence of biliary distention or cholecystitis.  On  reevaluation patient remains comfortable with good symptomatic control.  At this time I feel he is stable for discharge home with outpatient treatment for mild pancreatitis with pain medication, nausea medication clear liquid diet and close follow-up with his regular doctor.  Strict return precautions discussed with patient and he expresses understanding and is in agreement with plan.  Stable for discharge home at this time.   Jacqlyn Larsen, PA-C 01/30/18 [id number]    Shanon Rosser, MD 01/31/18 [phone]

## 2018-01-30 NOTE — ED Notes (Signed)
Patient went to the bathroom during discharge delaying process.

## 2018-01-30 NOTE — Discharge Instructions (Signed)
Your lab work shows mild inflammation of your pancreas, please use pain medication and nausea medication and maintain a clear liquid diet for the next 3 to 5 days until symptoms subside, if you have worsening pain, persistent vomiting despite nausea medicine, fevers please return to the emergency department.  Your CT scan and ultrasound show a 1.3 cm gallstone but no evidence of acute gallbladder inflammation or blockage, your CT scan otherwise looked good.  Please follow-up with your primary care doctor early next week for recheck and return to the emergency department for worsening pain or symptoms.  If you continue having issues with abdominal pain he may need to follow-up with general surgery for further assessment of gallstones.

## 2018-01-30 NOTE — ED Notes (Signed)
EKG given to EDP,Molpus,MD., for review. 

## 2018-01-30 NOTE — ED Notes (Signed)
Patient provided with Diet Ginger Ale at 0445, patient has not had any emesis

## 2018-01-30 NOTE — ED Triage Notes (Signed)
Pt from home with c/o abdominal pain after eating "half cooked wings at 711" and multiple episodes of emesis and diarrhea. Pt rates pain 10/10

## 2018-01-30 NOTE — ED Provider Notes (Signed)
[facility] DEPT Provider Note   CSN: [id number] Arrival date & time: 01/30/18  0326     History   Chief Complaint Chief Complaint  Patient presents with  . Abdominal Pain    HPI Jon Harper is a 62 y.o. male.  Patient presents with nausea, vomiting and generalized abdominal cramping that started about 2 hours after eating chicken wings he bought at a convenience store. He ate the chicken, which he did not feel was fully cooked, around 11:30 pm last night (01/29/18) and started having symptoms around 1:30 am that have persisted. No hematemesis or fever. He reports he has had 2 loose bowel movements in the last hour or so. No chest pain, SOB, urinary symptoms.   The history is provided by the patient. No language interpreter was used.  Abdominal Pain   Associated symptoms include nausea and vomiting. Pertinent negatives include fever.    Past Medical History:  Diagnosis Date  . Diabetes mellitus   . Hypertension     Patient Active Problem List   Diagnosis Date Noted  . PAC (premature atrial contraction) 07/07/2017  . Type 2 diabetes mellitus without complication, without long-term current use of insulin ([place]) 04/16/2017  . Abnormal EKG 04/16/2017  . Essential hypertension 04/16/2017  . Hyperlipidemia associated with type 2 diabetes mellitus ([place]) 04/16/2017  . Hx of renal cell cancer 04/16/2017  . Stage 3 chronic kidney disease ([place]) 04/16/2017  . History of unilateral nephrectomy 04/16/2017    Past Surgical History:  Procedure Laterality Date  . right nephrectomy          Home Medications    Prior to Admission medications   Medication Sig Start Date End Date Taking? Authorizing Provider  glipiZIDE (GLUCOTROL) 10 MG tablet Take 1 tablet (10 mg total) by mouth 2 (two) times daily before a meal. 09/23/17  Yes Azzie Glatter, FNP  losartan (COZAAR) 50 MG tablet Take 2 tablets (100 mg total) by mouth daily. 09/23/17  Yes Azzie Glatter, FNP  metoprolol tartrate (LOPRESSOR) 25 MG tablet Take 1 tablet (25 mg total) by mouth 2 (two) times daily. 09/23/17  Yes Azzie Glatter, FNP  atorvastatin (LIPITOR) 40 MG tablet Take 1 tablet (40 mg total) by mouth daily. Patient not taking: Reported on 01/30/2018 09/23/17   Azzie Glatter, FNP  gabapentin (NEURONTIN) 300 MG capsule Take 1 capsule (300 mg total) by mouth 2 (two) times daily. Patient not taking: Reported on 01/30/2018 09/23/17   Azzie Glatter, FNP  HYDROcodone-acetaminophen (NORCO/VICODIN) 5-325 MG tablet Take 1-2 tablets by mouth every 6 (six) hours as needed for moderate pain. Patient not taking: Reported on 01/30/2018 01/25/18   Lajean Saver, MD  hydrocortisone cream 0.5 % Apply 1 application topically 2 (two) times daily. Patient not taking: Reported on 10/21/2017 09/23/17   Azzie Glatter, FNP  methocarbamol (ROBAXIN) 750 MG tablet Take 1 tablet (750 mg total) by mouth 3 (three) times daily as needed (muscle spasm/pain). Patient not taking: Reported on 01/30/2018 01/25/18   Lajean Saver, MD  predniSONE (DELTASONE) 20 MG tablet 3 po once a day for 2 days, then 2 po once a day for 3 days, then 1 po once a day for 3 days Patient not taking: Reported on 01/30/2018 01/25/18   Lajean Saver, MD  ranitidine (ZANTAC) 150 MG tablet Take 1 tablet (150 mg total) by mouth 2 (two) times daily. Patient not taking: Reported on 10/21/2017 09/23/17   Azzie Glatter, FNP  tamsulosin (FLOMAX) 0.4 MG CAPS capsule Take 1 capsule (0.4 mg total) by mouth daily. Patient not taking: Reported on 09/23/2017 05/24/17   Scot Jun, FNP    Family History Family History  Problem Relation Age of Onset  . Diabetes Mother        non-insulin   . Diabetes Father        non-insulin   . Diabetes Sister        non-insulin     Social History Social History   Tobacco Use  . Smoking status: Never Smoker  . Smokeless tobacco: Never Used  Substance Use Topics  . Alcohol  use: No    Frequency: Never  . Drug use: No     Allergies   Patient has no known allergies.   Review of Systems Review of Systems  Constitutional: Negative for chills and fever.  Respiratory: Negative.  Negative for shortness of breath.   Cardiovascular: Negative.  Negative for chest pain.  Gastrointestinal: Positive for abdominal pain, nausea and vomiting.  Genitourinary: Negative.   Musculoskeletal: Negative.   Skin: Negative.   Neurological: Negative.      Physical Exam Updated Vital Signs BP (!) 124/110   Pulse 65   Temp 98.7 F (37.1 C) (Oral)   Resp (!) 21   SpO2 100%   Physical Exam  Constitutional: He appears well-developed and well-nourished.  HENT:  Head: Normocephalic.  Neck: Normal range of motion. Neck supple.  Cardiovascular: Normal rate and regular rhythm.  Pulmonary/Chest: Effort normal and breath sounds normal.  Abdominal: Soft. Bowel sounds are normal. He exhibits no distension. There is generalized tenderness. There is no rebound and no guarding.  Musculoskeletal: Normal range of motion.  Neurological: He is alert. No cranial nerve deficit.  Skin: Skin is warm and dry. No rash noted.  Psychiatric: He has a normal mood and affect.     ED Treatments / Results  Labs (all labs ordered are listed, but only abnormal results are displayed) Labs Reviewed  LIPASE, BLOOD - Abnormal; Notable for the following components:      Result Value   Lipase 137 (*)    All other components within normal limits  COMPREHENSIVE METABOLIC PANEL - Abnormal; Notable for the following components:   Glucose, Bld 127 (*)    BUN 27 (*)    Creatinine, Ser 1.38 (*)    ALT 47 (*)    GFR calc non Af Amer 54 (*)    All other components within normal limits  CBC - Abnormal; Notable for the following components:   WBC 11.2 (*)    All other components within normal limits  URINALYSIS, ROUTINE W REFLEX MICROSCOPIC    EKG EKG Interpretation  Date/Time:  Friday January 30 2018 03:56:25 EST Ventricular Rate:  93 PR Interval:    QRS Duration: 84 QT Interval:  354 QTC Calculation: 385 R Axis:   17 Text Interpretation:  Sinus rhythm Atrial premature complexes in couplets No significant change was found Confirmed by Shanon Rosser [phone]) on 01/30/2018 3:58:52 AM   Radiology No results found.  Procedures Procedures (including critical care time)  Medications Ordered in ED Medications  sodium chloride 0.9 % bolus 1,000 mL (1,000 mLs Intravenous New Bag/Given 01/30/18 0427)  metoCLOPramide (REGLAN) injection 10 mg (10 mg Intravenous Given 01/30/18 0427)  diphenhydrAMINE (BENADRYL) injection 12.5 mg (12.5 mg Intravenous Given 01/30/18 0427)     Initial Impression / Assessment and Plan / ED Course  I have reviewed the triage  vital signs and the nursing notes.  Pertinent labs & imaging results that were available during my care of the patient were reviewed by me and considered in my medical decision making (see chart for details).     Patient presents with generalized, cramping type abdominal pain that started after he ate chicken he feels was inadequately cooked or bad. No fever, bloody emesis.   He is given IVF's, Reglan and benadryl here with definite improvement in symptoms. No further vomiting. He is sleeping on re-evaluations x 2. When woken up he complains of continued pain, worst in the LUQ.  PO challenge give and he is tolerating this without worsening or recurrent vomiting.   Lipase is noted to be slightly elevated (137), as are renal functions (Cr 1.38, BUN 27). He is still very TTP of the upper abdomen.  There is RUQ tenderness but no significant elevated liver functions, no history of post-prandial RUQ pain in the past. Gall bladder pancreatitis is still a possibility but given diffuse tenderness, worse on LUQ, will obtain CT scan for further evaluation.  DDx: mild pancreatitis vs cholecystitis vs food poisoning vs viral  gastroenteritis.  Patient care signed out to [facility], PA-C, for review of CT scan and re-evaluation of the patient to determine appropriate disposition.  Final Clinical Impressions(s) / ED Diagnoses   Final diagnoses:  None   1. Abdominal pain 2. Nausea, vomiting 3. Mild pancreatitis.  ED Discharge Orders    None       Charlann Lange, PA-C 01/30/18 0549    Molpus, Jenny Reichmann, MD 01/30/18 [phone]

## 2018-02-12 MED FILL — LOSARTAN POTASSIUM 50 MG TA: 50 | 30 days supply | Qty: 60 | Fill #2

## 2018-02-12 MED FILL — glipiZIDE 10 MG TABS: 10 | 30 days supply | Qty: 60 | Fill #1

## 2018-02-12 MED FILL — METOPROLOL TARTRATE 25 MG T: 25 | 30 days supply | Qty: 60 | Fill #1

## 2018-04-01 ENCOUNTER — Ambulatory Visit (INDEPENDENT_AMBULATORY_CARE_PROVIDER_SITE_OTHER): Payer: Self-pay | Admitting: Family Medicine

## 2018-04-01 ENCOUNTER — Encounter: Payer: Self-pay | Admitting: Family Medicine

## 2018-04-01 VITALS — BP 138/84 | HR 84 | Temp 98.0°F | Ht 71.0 in | Wt 254.0 lb

## 2018-04-01 DIAGNOSIS — Z23 Encounter for immunization: Secondary | ICD-10-CM

## 2018-04-01 DIAGNOSIS — E119 Type 2 diabetes mellitus without complications: Secondary | ICD-10-CM

## 2018-04-01 DIAGNOSIS — R7303 Prediabetes: Secondary | ICD-10-CM

## 2018-04-01 DIAGNOSIS — M5441 Lumbago with sciatica, right side: Secondary | ICD-10-CM

## 2018-04-01 DIAGNOSIS — G8929 Other chronic pain: Secondary | ICD-10-CM

## 2018-04-01 DIAGNOSIS — M542 Cervicalgia: Secondary | ICD-10-CM

## 2018-04-01 DIAGNOSIS — I10 Essential (primary) hypertension: Secondary | ICD-10-CM

## 2018-04-01 DIAGNOSIS — F418 Other specified anxiety disorders: Secondary | ICD-10-CM

## 2018-04-01 DIAGNOSIS — Z09 Encounter for follow-up examination after completed treatment for conditions other than malignant neoplasm: Secondary | ICD-10-CM

## 2018-04-01 DIAGNOSIS — M5442 Lumbago with sciatica, left side: Secondary | ICD-10-CM

## 2018-04-01 LAB — POCT URINALYSIS DIP (MANUAL ENTRY)
Bilirubin, UA: NEGATIVE
Glucose, UA: NEGATIVE mg/dL
Ketones, POC UA: NEGATIVE mg/dL
Leukocytes, UA: NEGATIVE
Nitrite, UA: NEGATIVE
Protein Ur, POC: 100 mg/dL — AB
Spec Grav, UA: 1.02 (ref 1.010–1.025)
Urobilinogen, UA: 0.2 E.U./dL
pH, UA: 6 (ref 5.0–8.0)

## 2018-04-01 LAB — POCT GLYCOSYLATED HEMOGLOBIN (HGB A1C): Hemoglobin A1C: 7 % — AB (ref 4.0–5.6)

## 2018-04-01 MED ORDER — METHOCARBAMOL 750 MG PO TABS: 750.0000 mg | ORAL_TABLET | Freq: Two times a day (BID) | ORAL | 1 refills | Status: DC

## 2018-04-01 MED FILL — METHOCARBAMOL 750 MG TABS: 750 | 30 days supply | Qty: 60 | Fill #0

## 2018-04-01 NOTE — Progress Notes (Signed)
Hospital Follow--Established Patient Office Visit  Subjective:  Patient ID: Jon Harper, male    DOB: 1956-02-02  Age: 63 y.o. MRN: [id number]  CC:  Chief Complaint  Patient presents with  . Knee Pain    Left   . Leg Pain  . Back Pain    HPI Jon Harper is a 62 year male who presents for follow up of his chronic diseases.   Past Medical History:  Diagnosis Date  . Chronic low back pain   . Diabetes mellitus   . Hypertension   . Neck pain   . Neck pain    Current Status: Since his last ED visit, he continues to have increasing neck, back, and leg pain. He states that he is not been taking any medications for pain. He denies fatigue, frequent urination, blurred vision, excessive hunger, excessive thirst, weight gain, weight loss, and poor wound healing. He denies visual changes, chest pain, cough, shortness of breath, heart palpitations, and falls. He has occasionally headaches and dizziness with position changes. Denies severe headaches, confusion, seizures, double vision, and blurred vision, nausea and vomiting.  His anxiety is increased r/t his chronic pain. He denies suicidal ideations, homicidal ideations, or auditory hallucinations. He denies fevers, chills, fatigue, recent infections, weight loss, and night sweats. No reports of GI problems such as diarrhea, and constipation. He has no reports of blood in stools, dysuria and hematuria. No depression or anxiety, and  He denies pain today.   Past Surgical History:  Procedure Laterality Date  . right nephrectomy      Family History  Problem Relation Age of Onset  . Diabetes Mother        non-insulin   . Diabetes Father        non-insulin   . Diabetes Sister        non-insulin     Social History   Socioeconomic History  . Marital status: Married    Spouse name: Not on file  . Number of children: Not on file  . Years of education: Not on file  . Highest education level: Not on file  Occupational History  .  Not on file  Social Needs  . Financial resource strain: Not on file  . Food insecurity:    Worry: Not on file    Inability: Not on file  . Transportation needs:    Medical: Not on file    Non-medical: Not on file  Tobacco Use  . Smoking status: Never Smoker  . Smokeless tobacco: Never Used  Substance and Sexual Activity  . Alcohol use: No    Frequency: Never  . Drug use: No  . Sexual activity: Not on file  Lifestyle  . Physical activity:    Days per week: Not on file    Minutes per session: Not on file  . Stress: Not on file  Relationships  . Social connections:    Talks on phone: Not on file    Gets together: Not on file    Attends religious service: Not on file    Active member of club or organization: Not on file    Attends meetings of clubs or organizations: Not on file    Relationship status: Not on file  . Intimate partner violence:    Fear of current or ex partner: Not on file    Emotionally abused: Not on file    Physically abused: Not on file    Forced sexual activity: Not on file  Other  Topics Concern  . Not on file  Social History Narrative   Divorced.   Does exercise as much as possible.  Tries to go to gym at least 3 days a week.    Outpatient Medications Prior to Visit  Medication Sig Dispense Refill  . gabapentin (NEURONTIN) 300 MG capsule Take 1 capsule (300 mg total) by mouth 2 (two) times daily. 60 capsule 2  . glipiZIDE (GLUCOTROL) 10 MG tablet Take 1 tablet (10 mg total) by mouth 2 (two) times daily before a meal. 60 tablet 2  . losartan (COZAAR) 50 MG tablet Take 2 tablets (100 mg total) by mouth daily. 60 tablet 2  . metoprolol tartrate (LOPRESSOR) 25 MG tablet Take 1 tablet (25 mg total) by mouth 2 (two) times daily. 60 tablet 2  . atorvastatin (LIPITOR) 40 MG tablet Take 1 tablet (40 mg total) by mouth daily. (Patient not taking: Reported on 01/30/2018) 30 tablet 2  . hydrocortisone cream 0.5 % Apply 1 application topically 2 (two) times daily.  (Patient not taking: Reported on 10/21/2017) 30 g 2  . tamsulosin (FLOMAX) 0.4 MG CAPS capsule Take 1 capsule (0.4 mg total) by mouth daily. (Patient not taking: Reported on 09/23/2017) 30 capsule 3  . HYDROcodone-acetaminophen (NORCO/VICODIN) 5-325 MG tablet Take 1-2 tablets by mouth every 6 (six) hours as needed for moderate pain. (Patient not taking: Reported on 01/30/2018) 12 tablet 0  . methocarbamol (ROBAXIN) 750 MG tablet Take 1 tablet (750 mg total) by mouth 3 (three) times daily as needed (muscle spasm/pain). (Patient not taking: Reported on 01/30/2018) 15 tablet 0  . ondansetron (ZOFRAN ODT) 4 MG disintegrating tablet 4mg  ODT q4 hours prn nausea/vomit 10 tablet 0  . oxyCODONE-acetaminophen (PERCOCET) 5-325 MG tablet Take 1 tablet by mouth every 6 (six) hours as needed. (Patient not taking: Reported on 04/01/2018) 12 tablet 0  . predniSONE (DELTASONE) 20 MG tablet 3 po once a day for 2 days, then 2 po once a day for 3 days, then 1 po once a day for 3 days (Patient not taking: Reported on 01/30/2018) 15 tablet 0  . ranitidine (ZANTAC) 150 MG tablet Take 1 tablet (150 mg total) by mouth 2 (two) times daily. (Patient not taking: Reported on 10/21/2017) 60 tablet 1   No facility-administered medications prior to visit.     No Known Allergies  ROS Review of Systems  Constitutional: Negative.   HENT: Negative.   Eyes: Negative.   Respiratory: Positive for cough (occasional).   Cardiovascular: Negative.   Gastrointestinal: Negative.   Endocrine: Negative.   Genitourinary: Negative.   Musculoskeletal: Negative.   Allergic/Immunologic: Negative.   Neurological: Negative.   Hematological: Negative.   Psychiatric/Behavioral: Negative.    Objective:    Physical Exam  BP 138/84 (BP Location: Left Arm, Patient Position: Sitting, Cuff Size: Small)   Pulse 84   Temp 98 F (36.7 C) (Oral)   Ht 5\' 11"  (1.803 m)   Wt 254 lb (115.2 kg)   SpO2 96%   BMI 35.43 kg/m  Wt Readings from Last 3  Encounters:  04/01/18 254 lb (115.2 kg)  01/25/18 250 lb (113.4 kg)  10/21/17 253 lb (114.8 kg)     Health Maintenance Due  Topic Date Due  . PNEUMOCOCCAL POLYSACCHARIDE VACCINE AGE 54-64 HIGH RISK  06/02/1959  . FOOT EXAM  06/02/1967  . OPHTHALMOLOGY EXAM  06/02/1967  . COLONOSCOPY  06/02/2007  . INFLUENZA VACCINE  10/09/2017  . HEMOGLOBIN A1C  03/26/2018    There  are no preventive care reminders to display for this patient.  Lab Results  Component Value Date   TSH 1.370 04/16/2017   Lab Results  Component Value Date   WBC 11.2 (H) 01/30/2018   HGB 14.0 01/30/2018   HCT 41.5 01/30/2018   MCV 84.0 01/30/2018   PLT 173 01/30/2018   Lab Results  Component Value Date   NA 137 01/30/2018   K 3.7 01/30/2018   CO2 25 01/30/2018   GLUCOSE 127 (H) 01/30/2018   BUN 27 (H) 01/30/2018   CREATININE 1.38 (H) 01/30/2018   BILITOT 0.6 01/30/2018   ALKPHOS 43 01/30/2018   AST 17 01/30/2018   ALT 47 (H) 01/30/2018   PROT 7.3 01/30/2018   ALBUMIN 3.8 01/30/2018   CALCIUM 9.1 01/30/2018   ANIONGAP 7 01/30/2018   Lab Results  Component Value Date   CHOL 206 (H) 04/16/2017   Lab Results  Component Value Date   HDL 60 04/16/2017   Lab Results  Component Value Date   LDLCALC 133 (H) 04/16/2017   Lab Results  Component Value Date   TRIG 66 04/16/2017   Lab Results  Component Value Date   CHOLHDL 3.4 04/16/2017   Lab Results  Component Value Date   HGBA1C 7.0 (A) 04/01/2018   Assessment & Plan:   1. Hypertension, unspecified type Blood pressure is stable at 138/84 today. Continue Metoprolol and Losartan. She will continue to decrease high sodium intake, excessive alcohol intake, increase potassium intake, smoking cessation, and increase physical activity of at least 30 minutes of cardio activity daily. She will continue to follow Heart Healthy or DASH diet.  2. Type 2 diabetes mellitus without complication, without long-term current use of insulin ([facility]) Continue  Glucotrol as prescribed.   3. Chronic bilateral low back pain with bilateral sciatica We will initiate Robaxin today.  - methocarbamol (ROBAXIN) 750 MG tablet; Take 1 tablet (750 mg total) by mouth 2 (two) times daily.  Dispense: 60 tablet; Refill: 1 - Ambulatory referral to Physical Therapy - Ambulatory referral to Pain Clinic  4. Neck pain - methocarbamol (ROBAXIN) 750 MG tablet; Take 1 tablet (750 mg total) by mouth 2 (two) times daily.  Dispense: 60 tablet; Refill: 1 - Ambulatory referral to Physical Therapy - Ambulatory referral to Pain Clinic  5. Situational anxiety Anxiety today is r/t increased pain. We will continue to monitor.   6. Prediabetes - POCT glycosylated hemoglobin (Hb A1C) - POCT urinalysis dipstick  7. Need for immunization against influenza   8. Follow up He will follow up in 6 months.   Meds ordered this encounter  Medications  . methocarbamol (ROBAXIN) 750 MG tablet    Sig: Take 1 tablet (750 mg total) by mouth 2 (two) times daily.    Dispense:  60 tablet    Refill:  [place],  MSN, FNP-C [facility] [address] [place], [place] [address] [phone]    Referral Orders     Ambulatory referral to Physical Therapy     Ambulatory referral to Pain Clinic  Problem List Items Addressed This Visit      Endocrine   Type 2 diabetes mellitus without complication, without long-term current use of insulin ([place])    Other Visit Diagnoses    Hypertension, unspecified type    -  Primary   Chronic bilateral low back pain with bilateral sciatica       Relevant Medications   methocarbamol (ROBAXIN) 750  MG tablet   Other Relevant Orders   Ambulatory referral to Physical Therapy   Ambulatory referral to Pain Clinic   Neck pain       Relevant Medications   methocarbamol (ROBAXIN) 750 MG tablet   Other Relevant Orders   Ambulatory referral to Physical Therapy   Ambulatory referral to Pain Clinic    Prediabetes       Relevant Orders   POCT glycosylated hemoglobin (Hb A1C) (Completed)   POCT urinalysis dipstick (Completed)   Follow up          Meds ordered this encounter  Medications  . methocarbamol (ROBAXIN) 750 MG tablet    Sig: Take 1 tablet (750 mg total) by mouth 2 (two) times daily.    Dispense:  60 tablet    Refill:  1    Follow-up: Return in about 6 months (around 09/30/2018).    Azzie Glatter, FNP

## 2018-04-01 NOTE — Patient Instructions (Signed)
Methocarbamol tablets What is this medicine? METHOCARBAMOL (meth oh KAR ba mole) helps to relieve pain and stiffness in muscles caused by strains, sprains, or other injury to your muscles. This medicine may be used for other purposes; ask your health care provider or pharmacist if you have questions. COMMON BRAND NAME(S): Robaxin What should I tell my health care provider before I take this medicine? They need to know if you have any of these conditions: -kidney disease -seizures -an unusual or allergic reaction to methocarbamol, other medicines, foods, dyes, or preservatives -pregnant or trying to get pregnant -breast-feeding How should I use this medicine? Take this medicine by mouth with a full glass of water. Follow the directions on the prescription label. Take your medicine at regular intervals. Do not take your medicine more often than directed. Talk to your pediatrician regarding the use of this medicine in children. Special care may be needed. Overdosage: If you think you have taken too much of this medicine contact a poison control center or emergency room at once. NOTE: This medicine is only for you. Do not share this medicine with others. What if I miss a dose? If you miss a dose, take it as soon as you can. If it is almost time for your next dose, take only the next dose. Do not take double or extra doses. What may interact with this medicine? Do not take this medication with any of the following medicines: -narcotic medicines for cough This medicine may also interact with the following medications: -alcohol -antihistamines for allergy, cough and cold -certain medicines for anxiety or sleep -certain medicines for depression like amitriptyline, fluoxetine, sertraline -certain medicines for seizures like phenobarbital, primidone -cholinesterase inhibitors like neostigmine, ambenonium, and pyridostigmine bromide -general anesthetics like halothane, isoflurane, methoxyflurane,  propofol -local anesthetics like lidocaine, pramoxine, tetracaine -medicines that relax muscles for surgery -narcotic medicines for pain -phenothiazines like chlorpromazine, mesoridazine, prochlorperazine, thioridazine This list may not describe all possible interactions. Give your health care provider a list of all the medicines, herbs, non-prescription drugs, or dietary supplements you use. Also tell them if you smoke, drink alcohol, or use illegal drugs. Some items may interact with your medicine. What should I watch for while using this medicine? Tell your doctor or health care professional if your symptoms do not start to get better or if they get worse. You may get drowsy or dizzy. Do not drive, use machinery, or do anything that needs mental alertness until you know how this medicine affects you. Do not stand or sit up quickly, especially if you are an older patient. This reduces the risk of dizzy or fainting spells. Alcohol may interfere with the effect of this medicine. Avoid alcoholic drinks. If you are taking another medicine that also causes drowsiness, you may have more side effects. Give your health care provider a list of all medicines you use. Your doctor will tell you how much medicine to take. Do not take more medicine than directed. Call emergency for help if you have problems breathing or unusual sleepiness. What side effects may I notice from receiving this medicine? Side effects that you should report to your doctor or health care professional as soon as possible: -allergic reactions like skin rash, itching or hives, swelling of the face, lips, or tongue -breathing problems -confusion -seizures -unusually weak or tired Side effects that usually do not require medical attention (report to your doctor or health care professional if they continue or are bothersome): -dizziness -headache -metallic taste -tiredness -upset  stomach This list may not describe all possible side  effects. Call your doctor for medical advice about side effects. You may report side effects to FDA at 1-800-FDA-1088. Where should I keep my medicine? Keep out of the reach of children. Store at room temperature between 20 and 25 degrees C (68 and 77 degrees F). Keep container tightly closed. Throw away any unused medicine after the expiration date. NOTE: This sheet is a summary. It may not cover all possible information. If you have questions about this medicine, talk to your doctor, pharmacist, or health care provider.  2019 Elsevier/Gold Standard (2014-12-06 13:11:54)    Chronic Back Pain When back pain lasts longer than 3 months, it is called chronic back pain. Pain may get worse at certain times (flare-ups). There are things you can do at home to manage your pain. Follow these instructions at home: Activity      Avoid bending and other activities that make pain worse.  When standing: ? Keep your upper back and neck straight. ? Keep your shoulders pulled back. ? Avoid slouching.  When sitting: ? Keep your back straight. ? Relax your shoulders. Do not round your shoulders or pull them backward.  Do not sit or stand in one place for long periods of time.  Take short rest breaks during the day. Lying down or standing is usually better than sitting. Resting can help relieve pain.  When sitting or lying down for a long time, do some mild activity or stretching. This will help to prevent stiffness and pain.  Get regular exercise. Ask your doctor what activities are safe for you.  Do not lift anything that is heavier than 10 lb (4.5 kg). To prevent injury when you lift things: ? Bend your knees. ? Keep the weight close to your body. ? Avoid twisting. Managing pain  If told, put ice on the painful area. Your doctor may tell you to use ice for 24-48 hours after a flare-up starts. ? Put ice in a plastic bag. ? Place a towel between your skin and the bag. ? Leave the ice on  for 20 minutes, 2-3 times a day.  If told, put heat on the painful area as often as told by your doctor. Use the heat source that your doctor recommends, such as a moist heat pack or a heating pad. ? Place a towel between your skin and the heat source. ? Leave the heat on for 20-30 minutes. ? Remove the heat if your skin turns bright red. This is especially important if you are unable to feel pain, heat, or cold. You may have a greater risk of getting burned.  Soak in a warm bath. This can help relieve pain.  Take over-the-counter and prescription medicines only as told by your doctor. General instructions  Sleep on a firm mattress. Try lying on your side with your knees slightly bent. If you lie on your back, put a pillow under your knees.  Keep all follow-up visits as told by your doctor. This is important. Contact a doctor if:  You have pain that does not get better with rest or medicine. Get help right away if:  One or both of your arms or legs feel weak.  One or both of your arms or legs lose feeling (numbness).  You have trouble controlling when you poop (bowel movement) or pee (urinate).  You feel sick to your stomach (nauseous).  You throw up (vomit).  You have belly (abdominal) pain.  You have shortness of breath.  You pass out (faint). Summary  When back pain lasts longer than 3 months, it is called chronic back pain.  Pain may get worse at certain times (flare-ups).  Use ice and heat as told by your doctor. Your doctor may tell you to use ice after flare-ups. This information is not intended to replace advice given to you by your health care provider. Make sure you discuss any questions you have with your health care provider. Document Released: 08/14/2007 Document Revised: 10/10/2016 Document Reviewed: 10/10/2016 Elsevier Interactive Patient Education  2019 [employer].

## 2018-04-08 ENCOUNTER — Telehealth: Payer: Self-pay

## 2018-04-08 NOTE — Telephone Encounter (Signed)
Patient declined office visit for pain management on 04/07/2018.

## 2018-04-08 NOTE — Telephone Encounter (Signed)
Patient was advise that orange card does not cover pain management

## 2018-04-30 ENCOUNTER — Other Ambulatory Visit: Payer: Self-pay | Admitting: Family Medicine

## 2018-04-30 DIAGNOSIS — G629 Polyneuropathy, unspecified: Secondary | ICD-10-CM

## 2018-04-30 DIAGNOSIS — I10 Essential (primary) hypertension: Secondary | ICD-10-CM

## 2018-04-30 DIAGNOSIS — E119 Type 2 diabetes mellitus without complications: Secondary | ICD-10-CM

## 2018-04-30 MED ORDER — GLIPIZIDE 10 MG PO TABS: 10.0000 mg | ORAL_TABLET | Freq: Two times a day (BID) | ORAL | 2 refills | Status: DC

## 2018-04-30 MED ORDER — METOPROLOL TARTRATE 25 MG PO TABS: 25.0000 mg | ORAL_TABLET | Freq: Two times a day (BID) | ORAL | 2 refills | Status: DC

## 2018-04-30 MED ORDER — GABAPENTIN 300 MG PO CAPS: 300.0000 mg | ORAL_CAPSULE | Freq: Two times a day (BID) | ORAL | 2 refills | Status: DC

## 2018-04-30 NOTE — Telephone Encounter (Signed)
Medication sent.

## 2018-05-22 MED FILL — LOSARTAN POTASSIUM 50 MG TA: 50 | 30 days supply | Qty: 60 | Fill #0

## 2018-05-22 MED FILL — GABAPENTIN 300 MG CAPSULE: 300 | 30 days supply | Qty: 60 | Fill #0

## 2018-05-22 MED FILL — METHOCARBAMOL 750 MG TABS: 750 | 30 days supply | Qty: 60 | Fill #1

## 2018-05-22 MED FILL — METOPROLOL TARTRATE 25 MG T: 25 | 30 days supply | Qty: 60 | Fill #0

## 2018-05-22 MED FILL — glipiZIDE 10 MG TABS: 10 | 30 days supply | Qty: 60 | Fill #0

## 2018-07-10 MED FILL — ?METOPROLOL 25 MG TABLET: 25 | 30 days supply | Qty: 60 | Fill #1

## 2018-07-10 MED FILL — ?GLIPIZIDE 10 MG TABLET: 10 | 30 days supply | Qty: 60 | Fill #1

## 2018-07-10 MED FILL — GABAPENTIN 300 MG CAPSULE: 300 | 30 days supply | Qty: 60 | Fill #1

## 2018-07-10 MED FILL — LOSARTAN POTASSIUM 50 MG TA: 50 | 30 days supply | Qty: 60 | Fill #1

## 2018-08-31 MED FILL — GABAPENTIN 300 MG CAPSULE: 300 | 30 days supply | Qty: 60 | Fill #2

## 2018-08-31 MED FILL — ?METOPROLOL 25 MG TABLET: 25 | 30 days supply | Qty: 60 | Fill #2

## 2018-08-31 MED FILL — LOSARTAN POTASSIUM 50 MG TA: 50 | 30 days supply | Qty: 60 | Fill #2

## 2018-08-31 MED FILL — ?GLIPIZIDE 10 MG TABLET: 10 | 30 days supply | Qty: 60 | Fill #2

## 2018-09-30 ENCOUNTER — Other Ambulatory Visit: Payer: Self-pay

## 2018-09-30 ENCOUNTER — Ambulatory Visit (INDEPENDENT_AMBULATORY_CARE_PROVIDER_SITE_OTHER): Payer: Self-pay | Admitting: Family Medicine

## 2018-09-30 ENCOUNTER — Encounter: Payer: Self-pay | Admitting: Family Medicine

## 2018-09-30 VITALS — BP 140/86 | HR 98 | Temp 98.0°F | Ht 71.0 in | Wt 251.0 lb

## 2018-09-30 DIAGNOSIS — Z1211 Encounter for screening for malignant neoplasm of colon: Secondary | ICD-10-CM

## 2018-09-30 DIAGNOSIS — G8929 Other chronic pain: Secondary | ICD-10-CM | POA: Insufficient documentation

## 2018-09-30 DIAGNOSIS — E119 Type 2 diabetes mellitus without complications: Secondary | ICD-10-CM

## 2018-09-30 DIAGNOSIS — I10 Essential (primary) hypertension: Secondary | ICD-10-CM

## 2018-09-30 DIAGNOSIS — Z09 Encounter for follow-up examination after completed treatment for conditions other than malignant neoplasm: Secondary | ICD-10-CM

## 2018-09-30 DIAGNOSIS — R7303 Prediabetes: Secondary | ICD-10-CM | POA: Insufficient documentation

## 2018-09-30 DIAGNOSIS — M5442 Lumbago with sciatica, left side: Secondary | ICD-10-CM

## 2018-09-30 DIAGNOSIS — R8290 Unspecified abnormal findings in urine: Secondary | ICD-10-CM

## 2018-09-30 DIAGNOSIS — M5441 Lumbago with sciatica, right side: Secondary | ICD-10-CM

## 2018-09-30 DIAGNOSIS — M542 Cervicalgia: Secondary | ICD-10-CM

## 2018-09-30 LAB — POCT URINALYSIS DIP (MANUAL ENTRY)
Bilirubin, UA: NEGATIVE
Glucose, UA: NEGATIVE mg/dL
Ketones, POC UA: NEGATIVE mg/dL
Leukocytes, UA: NEGATIVE
Nitrite, UA: NEGATIVE
Protein Ur, POC: 100 mg/dL — AB
Spec Grav, UA: 1.02 (ref 1.010–1.025)
Urobilinogen, UA: 0.2 E.U./dL
pH, UA: 7 (ref 5.0–8.0)

## 2018-09-30 LAB — GLUCOSE, POCT (MANUAL RESULT ENTRY): POC Glucose: 200 mg/dl — AB (ref 70–99)

## 2018-09-30 LAB — POCT GLYCOSYLATED HEMOGLOBIN (HGB A1C): Hemoglobin A1C: 7.2 % — AB (ref 4.0–5.6)

## 2018-09-30 NOTE — Progress Notes (Signed)
Patient [facility]    Established Patient Office Visit  Subjective:  Patient ID: Jon Harper, male    DOB: 04/30/1955  Age: 63 y.o. MRN: [id number]  CC:  Chief Complaint  Patient presents with  . Follow-up    HPI Jon Harper is a 63 year old male who presents for follow up today.   Past Medical History:  Diagnosis Date  . Chronic low back pain   . Diabetes mellitus   . Hypertension   . Neck pain   . Neck pain    Current Status: Since his last office visit, he is doing well with no complaints. He denies visual changes, chest pain, cough, shortness of breath, heart palpitations, and falls. He has occasional headaches and dizziness with position changes. He reports occasional blurry vision and urinary frequency. Denies severe headaches, confusion, seizures, double vision, nausea and vomiting. He denies fatigue, excessive hunger, excessive thirst, weight gain, weight loss, and poor wound healing. He continues to check his feet regularly. He continues to have chronic pain in his back, neck and legs.   He denies fevers, chills, recent infections, weight loss, and night sweats. He has not had any falls. No chest pain, heart palpitations, cough and shortness of breath reported. No reports of GI problems such as diarrhea, and constipation. He has no reports of blood in stools, dysuria and hematuria. No depression or anxiety reported.    Past Surgical History:  Procedure Laterality Date  . right nephrectomy      Family History  Problem Relation Age of Onset  . Diabetes Mother        non-insulin   . Diabetes Father        non-insulin   . Diabetes Sister        non-insulin     Social History   Socioeconomic History  . Marital status: Married    Spouse name: Not on file  . Number of children: Not on file  . Years of education: Not on file  . Highest education level: Not on file  Occupational History  . Not on file  Social  Needs  . Financial resource strain: Not on file  . Food insecurity    Worry: Not on file    Inability: Not on file  . Transportation needs    Medical: Not on file    Non-medical: Not on file  Tobacco Use  . Smoking status: Never Smoker  . Smokeless tobacco: Never Used  Substance and Sexual Activity  . Alcohol use: No    Frequency: Never  . Drug use: No  . Sexual activity: Not on file  Lifestyle  . Physical activity    Days per week: Not on file    Minutes per session: Not on file  . Stress: Not on file  Relationships  . Social Herbalist on phone: Not on file    Gets together: Not on file    Attends religious service: Not on file    Active member of club or organization: Not on file    Attends meetings of clubs or organizations: Not on file    Relationship status: Not on file  . Intimate partner violence    Fear of current or ex partner: Not on file    Emotionally abused: Not on file    Physically abused: Not on file    Forced sexual activity: Not on file  Other Topics Concern  .  Not on file  Social History Narrative   Divorced.   Does exercise as much as possible.  Tries to go to gym at least 3 days a week.    Outpatient Medications Prior to Visit  Medication Sig Dispense Refill  . atorvastatin (LIPITOR) 40 MG tablet Take 1 tablet (40 mg total) by mouth daily. 30 tablet 2  . gabapentin (NEURONTIN) 300 MG capsule Take 1 capsule (300 mg total) by mouth 2 (two) times daily. 60 capsule 2  . glipiZIDE (GLUCOTROL) 10 MG tablet Take 1 tablet (10 mg total) by mouth 2 (two) times daily before a meal. 60 tablet 2  . losartan (COZAAR) 50 MG tablet TAKE 2 TABLETS (100 MG TOTAL) BY MOUTH DAILY. 60 tablet 2  . methocarbamol (ROBAXIN) 750 MG tablet Take 1 tablet (750 mg total) by mouth 2 (two) times daily. 60 tablet 1  . metoprolol tartrate (LOPRESSOR) 25 MG tablet Take 1 tablet (25 mg total) by mouth 2 (two) times daily. 60 tablet 2  . hydrocortisone cream 0.5 % Apply 1  application topically 2 (two) times daily. (Patient not taking: Reported on 10/21/2017) 30 g 2  . tamsulosin (FLOMAX) 0.4 MG CAPS capsule Take 1 capsule (0.4 mg total) by mouth daily. (Patient not taking: Reported on 09/23/2017) 30 capsule 3   No facility-administered medications prior to visit.     No Known Allergies  ROS Review of Systems  Constitutional: Negative.   HENT: Negative.   Eyes: Positive for visual disturbance (blurry vision).  Respiratory: Negative.   Cardiovascular: Negative.   Gastrointestinal: Negative.   Endocrine: Negative.   Genitourinary: Negative.   Musculoskeletal: Positive for arthralgias (generalized) and back pain (chronic).  Skin: Negative.   Allergic/Immunologic: Negative.   Neurological: Positive for dizziness (occasional) and headaches (occasional ).  Hematological: Negative.   Psychiatric/Behavioral: Negative.       Objective:    Physical Exam  Constitutional: He is oriented to person, place, and time. He appears well-developed and well-nourished.  HENT:  Head: Normocephalic and atraumatic.  Eyes: Conjunctivae are normal.  Neck: Normal range of motion. Neck supple.  Cardiovascular: Normal rate, regular rhythm, normal heart sounds and intact distal pulses.  Pulmonary/Chest: Effort normal and breath sounds normal.  Abdominal: Soft. Bowel sounds are normal.  Musculoskeletal: Normal range of motion.  Neurological: He is alert and oriented to person, place, and time. He has normal reflexes.  Skin: Skin is warm and dry.  Psychiatric: He has a normal mood and affect. His behavior is normal. Judgment and thought content normal.  Nursing note and vitals reviewed.   BP 140/86 (BP Location: Right Arm, Patient Position: Sitting, Cuff Size: Large)   Pulse 98   Temp 98 F (36.7 C) (Oral)   Ht 5\' 11"  (1.803 m)   Wt 251 lb (113.9 kg)   SpO2 100%   BMI 35.01 kg/m  Wt Readings from Last 3 Encounters:  09/30/18 251 lb (113.9 kg)  04/01/18 254 lb  (115.2 kg)  01/25/18 250 lb (113.4 kg)    Health Maintenance Due  Topic Date Due  . FOOT EXAM  06/02/1967  . OPHTHALMOLOGY EXAM  06/02/1967  . COLONOSCOPY  06/02/2007  . HEMOGLOBIN A1C  09/30/2018    There are no preventive care reminders to display for this patient.  Lab Results  Component Value Date   TSH 1.370 04/16/2017   Lab Results  Component Value Date   WBC 11.2 (H) 01/30/2018   HGB 14.0 01/30/2018   HCT 41.5 01/30/2018  MCV 84.0 01/30/2018   PLT 173 01/30/2018   Lab Results  Component Value Date   NA 137 01/30/2018   K 3.7 01/30/2018   CO2 25 01/30/2018   GLUCOSE 127 (H) 01/30/2018   BUN 27 (H) 01/30/2018   CREATININE 1.38 (H) 01/30/2018   BILITOT 0.6 01/30/2018   ALKPHOS 43 01/30/2018   AST 17 01/30/2018   ALT 47 (H) 01/30/2018   PROT 7.3 01/30/2018   ALBUMIN 3.8 01/30/2018   CALCIUM 9.1 01/30/2018   ANIONGAP 7 01/30/2018   Lab Results  Component Value Date   CHOL 206 (H) 04/16/2017   Lab Results  Component Value Date   HDL 60 04/16/2017   Lab Results  Component Value Date   LDLCALC 133 (H) 04/16/2017   Lab Results  Component Value Date   TRIG 66 04/16/2017   Lab Results  Component Value Date   CHOLHDL 3.4 04/16/2017   Lab Results  Component Value Date   HGBA1C 7.2 (A) 09/30/2018      Assessment & Plan:   1. Type 2 diabetes mellitus without complication, without long-term current use of insulin (HCC) - POCT glycosylated hemoglobin (Hb A1C) - POCT urinalysis dipstick - POCT glucose (manual entry)  2. Hypertension, unspecified type The current medical regimen is effective; blood pressure is stable at 140/86 today; continue present plan and medications as prescribed. She will continue to decrease high sodium intake, excessive alcohol intake, increase potassium intake, smoking cessation, and increase physical activity of at least 30 minutes of cardio activity daily. She will continue to follow Heart Healthy or DASH diet.  3. Neck  pain  4. Chronic bilateral low back pain with bilateral sciatica Stable today. He will continue OTC pain medications as prescribed.   5. Encounter for diabetic foot exam ([place]) Negative. Foot exam tolerated well. No decreased sensitivity noted upon foot exam. Patient counseled on proper foot hygiene. She is encouraged to exam feet often (daily), using mirror if necessary; keep feet clean and dry (especially between toes), keep feet moistened, wear cotton socks, and avoid wearing open-toed shoes, high-heel shoes, and sandals. Patient verbalized understanding.   6. Prediabetes Hgb A1c is stable at 7.2 today He will continue to decrease foods/beverages high in sugars and carbs and follow Heart Healthy or DASH diet. Increase physical activity to at least 30 minutes cardio exercise daily.   7. Screening for colon cancer - Ambulatory referral to Gastroenterology  8. Abnormal urinalysis Results are pending.  - Urine Culture  9. Follow up He will follow up in 4 moths.   No orders of the defined types were placed in this encounter.   Orders Placed This Encounter  Procedures  . Urine Culture  . Ambulatory referral to Gastroenterology  . POCT glycosylated hemoglobin (Hb A1C)  . POCT urinalysis dipstick  . POCT glucose (manual entry)     Referral Orders     Ambulatory referral to Gastroenterology   Kathe Becton,  MSN, FNP-BC [facility] [address] [place], [place] [address] [phone] fax    Problem List Items Addressed This Visit      Endocrine   Type 2 diabetes mellitus without complication, without long-term current use of insulin ([place]) - Primary   Relevant Orders   POCT glycosylated hemoglobin (Hb A1C) (Completed)   POCT urinalysis dipstick (Completed)   POCT glucose (manual entry) (Completed)    Other Visit Diagnoses    Hypertension, unspecified type  Neck pain        Chronic bilateral low back pain with bilateral sciatica       Encounter for diabetic foot exam ([place])       Prediabetes       Screening for colon cancer       Relevant Orders   Ambulatory referral to Gastroenterology   Abnormal urinalysis       Relevant Orders   Urine Culture   Follow up          No orders of the defined types were placed in this encounter.   Follow-up: Return in about 4 months (around 01/31/2019).    Azzie Glatter, FNP

## 2018-09-30 NOTE — Patient Instructions (Addendum)
Preventing Type 2 Diabetes Mellitus Type 2 diabetes (type 2 diabetes mellitus) is a long-term (chronic) disease that affects blood sugar (glucose) levels. Normally, a hormone called insulin allows glucose to enter cells in the body. The cells use glucose for energy. In type 2 diabetes, one or both of these problems may be present:  The body does not make enough insulin.  The body does not respond properly to insulin that it makes (insulin resistance). Insulin resistance or lack of insulin causes excess glucose to build up in the blood instead of going into cells. As a result, high blood glucose (hyperglycemia) develops, which can cause many complications. Being overweight or obese and having an inactive (sedentary) lifestyle can increase your risk for diabetes. Type 2 diabetes can be delayed or prevented by making certain nutrition and lifestyle changes. What nutrition changes can be made?   Eat healthy meals and snacks regularly. Keep a healthy snack with you for when you get hungry between meals, such as fruit or a handful of nuts.  Eat lean meats and proteins that are low in saturated fats, such as chicken, fish, egg whites, and beans. Avoid processed meats.  Eat plenty of fruits and vegetables and plenty of grains that have not been processed (whole grains). It is recommended that you eat: ? 1?2 cups of fruit every day. ? 2?3 cups of vegetables every day. ? 6?8 oz of whole grains every day, such as oats, whole wheat, bulgur, brown rice, quinoa, and millet.  Eat low-fat dairy products, such as milk, yogurt, and cheese.  Eat foods that contain healthy fats, such as nuts, avocado, olive oil, and canola oil.  Drink water throughout the day. Avoid drinks that contain added sugar, such as soda or sweet tea.  Follow instructions from your health care provider about specific eating or drinking restrictions.  Control how much food you eat at a time (portion size). ? Check food labels to find  out the serving sizes of foods. ? Use a kitchen scale to weigh amounts of foods.  Saute or steam food instead of frying it. Cook with water or broth instead of oils or butter.  Limit your intake of: ? Salt (sodium). Have no more than 1 tsp (2,400 mg) of sodium a day. If you have heart disease or high blood pressure, have less than ? tsp (1,500 mg) of sodium a day. ? Saturated fat. This is fat that is solid at room temperature, such as butter or fat on meat. What lifestyle changes can be made? Activity   Do moderate-intensity physical activity for at least 30 minutes on at least 5 days of the week, or as much as told by your health care provider.  Ask your health care provider what activities are safe for you. A mix of physical activities may be best, such as walking, swimming, cycling, and strength training.  Try to add physical activity into your day. For example: ? Park in spots that are farther away than usual, so that you walk more. For example, park in a far corner of the parking lot when you go to the office or the grocery store. ? Take a walk during your lunch break. ? Use stairs instead of elevators or escalators. Weight Loss  Lose weight as directed. Your health care provider can determine how much weight loss is best for you and can help you lose weight safely.  If you are overweight or obese, you may be instructed to lose at least 5?7 %   of your body weight. Alcohol and Tobacco   Limit alcohol intake to no more than 1 drink a day for nonpregnant women and 2 drinks a day for men. One drink equals 12 oz of beer, 5 oz of wine, or 1 oz of hard liquor.  Do not use any tobacco products, such as cigarettes, chewing tobacco, and e-cigarettes. If you need help quitting, ask your health care provider. Work With [place] Provider  Have your blood glucose tested regularly, as told by your health care provider.  Discuss your risk factors and how you can reduce your risk for  diabetes.  Get screening tests as told by your health care provider. You may have screening tests regularly, especially if you have certain risk factors for type 2 diabetes.  Make an appointment with a diet and nutrition specialist (registered dietitian). A registered dietitian can help you make a healthy eating plan and can help you understand portion sizes and food labels. Why are these changes important?  It is possible to prevent or delay type 2 diabetes and related health problems by making lifestyle and nutrition changes.  It can be difficult to recognize signs of type 2 diabetes. The best way to avoid possible damage to your body is to take actions to prevent the disease before you develop symptoms. What can happen if changes are not made?  Your blood glucose levels may keep increasing. Having high blood glucose for a long time is dangerous. Too much glucose in your blood can damage your blood vessels, heart, kidneys, nerves, and eyes.  You may develop prediabetes or type 2 diabetes. Type 2 diabetes can lead to many chronic health problems and complications, such as: ? Heart disease. ? Stroke. ? Blindness. ? Kidney disease. ? Depression. ? Poor circulation in the feet and legs, which could lead to surgical removal (amputation) in severe cases. Where to find support  Ask your health care provider to recommend a registered dietitian, diabetes educator, or weight loss program.  Look for local or online weight loss groups.  Join a gym, fitness club, or outdoor activity group, such as a walking club. Where to find more information To learn more about diabetes and diabetes prevention, visit:  American Diabetes Association (ADA): www.diabetes.[employer] of Diabetes and Digestive and Kidney Diseases: FindSpin.nl To learn more about healthy eating, visit:  The U.S. Department of Agriculture Scientist, research (physical sciences)), Choose My Plate:  http://wiley-williams.com/  Office of Disease Prevention and Health Promotion (ODPHP), Dietary Guidelines: SurferLive.at Summary  You can reduce your risk for type 2 diabetes by increasing your physical activity, eating healthy foods, and losing weight as directed.  Talk with your health care provider about your risk for type 2 diabetes. Ask about any blood tests or screening tests that you need to have. This information is not intended to replace advice given to you by your health care provider. Make sure you discuss any questions you have with your health care provider. Document Released: 06/19/2015 Document Revised: 06/19/2018 Document Reviewed: 04/18/2015 Elsevier Patient Education  2020 [place]. Prediabetes Eating Plan Prediabetes is a condition that causes blood sugar (glucose) levels to be higher than normal. This increases the risk for developing diabetes. In order to prevent diabetes from developing, your health care provider may recommend a diet and other lifestyle changes to help you:  Control your blood glucose levels.  Improve your cholesterol levels.  Manage your blood pressure. Your health care provider may recommend working with a diet and nutrition  specialist (dietitian) to make a meal plan that is best for you. What are tips for following this plan? Lifestyle  Set weight loss goals with the help of your health care team. It is recommended that most people with prediabetes lose 7% of their current body weight.  Exercise for at least 30 minutes at least 5 days a week.  Attend a support group or seek ongoing support from a mental health counselor.  Take over-the-counter and prescription medicines only as told by your health care provider. Reading food labels  Read food labels to check the amount of fat, salt (sodium), and sugar in prepackaged foods. Avoid foods that have: ? Saturated fats. ? Trans fats. ? Added sugars.  Avoid foods  that have more than 300 milligrams (mg) of sodium per serving. Limit your daily sodium intake to less than 2,300 mg each day. Shopping  Avoid buying pre-made and processed foods. Cooking  Cook with olive oil. Do not use butter, lard, or ghee.  Bake, broil, grill, or boil foods. Avoid frying. Meal planning   Work with your dietitian to develop an eating plan that is right for you. This may include: ? Tracking how many calories you take in. Use a food diary, notebook, or mobile application to track what you eat at each meal. ? Using the glycemic index (GI) to plan your meals. The index tells you how quickly a food will raise your blood glucose. Choose low-GI foods. These foods take a longer time to raise blood glucose.  Consider following a Mediterranean diet. This diet includes: ? Several servings each day of fresh fruits and vegetables. ? Eating fish at least twice a week. ? Several servings each day of whole grains, beans, nuts, and seeds. ? Using olive oil instead of other fats. ? Moderate alcohol consumption. ? Eating small amounts of red meat and whole-fat dairy.  If you have high blood pressure, you may need to limit your sodium intake or follow a diet such as the DASH eating plan. DASH is an eating plan that aims to lower high blood pressure. What foods are recommended? The items listed below may not be a complete list. Talk with your dietitian about what dietary choices are best for you. Grains Whole grains, such as whole-wheat or whole-grain breads, crackers, cereals, and pasta. Unsweetened oatmeal. Bulgur. Barley. Quinoa. Brown rice. Corn or whole-wheat flour tortillas or taco shells. Vegetables Lettuce. Spinach. Peas. Beets. Cauliflower. Cabbage. Broccoli. Carrots. Tomatoes. Squash. Eggplant. Herbs. Peppers. Onions. Cucumbers. Brussels sprouts. Fruits Berries. Bananas. Apples. Oranges. Grapes. Papaya. Mango. Pomegranate. Kiwi. Grapefruit. Cherries. Meats and other protein  foods Seafood. Poultry without skin. Lean cuts of pork and beef. Tofu. Eggs. Nuts. Beans. Dairy Low-fat or fat-free dairy products, such as yogurt, cottage cheese, and cheese. Beverages Water. Tea. Coffee. Sugar-free or diet soda. Seltzer water. Lowfat or no-fat milk. Milk alternatives, such as soy or almond milk. Fats and oils Olive oil. Canola oil. Sunflower oil. Grapeseed oil. Avocado. Walnuts. Sweets and desserts Sugar-free or low-fat pudding. Sugar-free or low-fat ice cream and other frozen treats. Seasoning and other foods Herbs. Sodium-free spices. Mustard. Relish. Low-fat, low-sugar ketchup. Low-fat, low-sugar barbecue sauce. Low-fat or fat-free mayonnaise. What foods are not recommended? The items listed below may not be a complete list. Talk with your dietitian about what dietary choices are best for you. Grains Refined white flour and flour products, such as bread, pasta, snack foods, and cereals. Vegetables Canned vegetables. Frozen vegetables with butter or cream sauce. Fruits Fruits  canned with syrup. Meats and other protein foods Fatty cuts of meat. Poultry with skin. Breaded or fried meat. Processed meats. Dairy Full-fat yogurt, cheese, or milk. Beverages Sweetened drinks, such as sweet iced tea and soda. Fats and oils Butter. Lard. Ghee. Sweets and desserts Baked goods, such as cake, cupcakes, pastries, cookies, and cheesecake. Seasoning and other foods Spice mixes with added salt. Ketchup. Barbecue sauce. Mayonnaise. Summary  To prevent diabetes from developing, you may need to make diet and other lifestyle changes to help control blood sugar, improve cholesterol levels, and manage your blood pressure.  Set weight loss goals with the help of your health care team. It is recommended that most people with prediabetes lose 7 percent of their current body weight.  Consider following a Mediterranean diet that includes plenty of fresh fruits and vegetables, whole  grains, beans, nuts, seeds, fish, lean meat, low-fat dairy, and healthy oils. This information is not intended to replace advice given to you by your health care provider. Make sure you discuss any questions you have with your health care provider. Document Released: 07/12/2014 Document Revised: 06/19/2018 Document Reviewed: 05/01/2016 Elsevier Patient Education  2020 [place] DASH stands for "Dietary Approaches to Stop Hypertension." The DASH eating plan is a healthy eating plan that has been shown to reduce high blood pressure (hypertension). It may also reduce your risk for type 2 diabetes, heart disease, and stroke. The DASH eating plan may also help with weight loss. What are tips for following this plan?  General guidelines  Avoid eating more than 2,300 mg (milligrams) of salt (sodium) a day. If you have hypertension, you may need to reduce your sodium intake to 1,500 mg a day.  Limit alcohol intake to no more than 1 drink a day for nonpregnant women and 2 drinks a day for men. One drink equals 12 oz of beer, 5 oz of wine, or 1 oz of hard liquor.  Work with your health care provider to maintain a healthy body weight or to lose weight. Ask what an ideal weight is for you.  Get at least 30 minutes of exercise that causes your heart to beat faster (aerobic exercise) most days of the week. Activities may include walking, swimming, or biking.  Work with your health care provider or diet and nutrition specialist (dietitian) to adjust your eating plan to your individual calorie needs. Reading food labels   Check food labels for the amount of sodium per serving. Choose foods with less than 5 percent of the Daily Value of sodium. Generally, foods with less than 300 mg of sodium per serving fit into this eating plan.  To find whole grains, look for the word "whole" as the first word in the ingredient list. Shopping  Buy products labeled as "low-sodium" or "no salt  added."  Buy fresh foods. Avoid canned foods and premade or frozen meals. Cooking  Avoid adding salt when cooking. Use salt-free seasonings or herbs instead of table salt or sea salt. Check with your health care provider or pharmacist before using salt substitutes.  Do not fry foods. Cook foods using healthy methods such as baking, boiling, grilling, and broiling instead.  Cook with heart-healthy oils, such as olive, canola, soybean, or sunflower oil. Meal planning  Eat a balanced diet that includes: ? 5 or more servings of fruits and vegetables each day. At each meal, try to fill half of your plate with fruits and vegetables. ? Up to 6-8 servings of whole  grains each day. ? Less than 6 oz of lean meat, poultry, or fish each day. A 3-oz serving of meat is about the same size as a deck of cards. One egg equals 1 oz. ? 2 servings of low-fat dairy each day. ? A serving of nuts, seeds, or beans 5 times each week. ? Heart-healthy fats. Healthy fats called Omega-3 fatty acids are found in foods such as flaxseeds and coldwater fish, like sardines, salmon, and mackerel.  Limit how much you eat of the following: ? Canned or prepackaged foods. ? Food that is high in trans fat, such as fried foods. ? Food that is high in saturated fat, such as fatty meat. ? Sweets, desserts, sugary drinks, and other foods with added sugar. ? Full-fat dairy products.  Do not salt foods before eating.  Try to eat at least 2 vegetarian meals each week.  Eat more home-cooked food and less restaurant, buffet, and fast food.  When eating at a restaurant, ask that your food be prepared with less salt or no salt, if possible. What foods are recommended? The items listed may not be a complete list. Talk with your dietitian about what dietary choices are best for you. Grains Whole-grain or whole-wheat bread. Whole-grain or whole-wheat pasta. Brown rice. [place]. Bulgur. Whole-grain and low-sodium cereals.  Pita bread. Low-fat, low-sodium crackers. Whole-wheat flour tortillas. Vegetables Fresh or frozen vegetables (raw, steamed, roasted, or grilled). Low-sodium or reduced-sodium tomato and vegetable juice. Low-sodium or reduced-sodium tomato sauce and tomato paste. Low-sodium or reduced-sodium canned vegetables. Fruits All fresh, dried, or frozen fruit. Canned fruit in natural juice (without added sugar). Meat and other protein foods Skinless chicken or [place]. Ground chicken or [place]. Pork with fat trimmed off. Fish and seafood. Egg whites. Dried beans, peas, or lentils. Unsalted nuts, nut butters, and seeds. Unsalted canned beans. Lean cuts of beef with fat trimmed off. Low-sodium, lean deli meat. Dairy Low-fat (1%) or fat-free (skim) milk. Fat-free, low-fat, or reduced-fat cheeses. Nonfat, low-sodium ricotta or cottage cheese. Low-fat or nonfat yogurt. Low-fat, low-sodium cheese. Fats and oils Soft margarine without trans fats. Vegetable oil. Low-fat, reduced-fat, or light mayonnaise and salad dressings (reduced-sodium). Canola, safflower, olive, soybean, and sunflower oils. Avocado. Seasoning and other foods Herbs. Spices. Seasoning mixes without salt. Unsalted popcorn and pretzels. Fat-free sweets. What foods are not recommended? The items listed may not be a complete list. Talk with your dietitian about what dietary choices are best for you. Grains Baked goods made with fat, such as croissants, muffins, or some breads. Dry pasta or rice meal packs. Vegetables Creamed or fried vegetables. Vegetables in a cheese sauce. Regular canned vegetables (not low-sodium or reduced-sodium). Regular canned tomato sauce and paste (not low-sodium or reduced-sodium). Regular tomato and vegetable juice (not low-sodium or reduced-sodium). Angie Fava. Olives. Fruits Canned fruit in a light or heavy syrup. Fried fruit. Fruit in cream or butter sauce. Meat and other protein foods Fatty cuts of meat. Ribs. Fried  meat. Berniece Salines. Sausage. Bologna and other processed lunch meats. Salami. Fatback. Hotdogs. Bratwurst. Salted nuts and seeds. Canned beans with added salt. Canned or smoked fish. Whole eggs or egg yolks. Chicken or [place] with skin. Dairy Whole or 2% milk, cream, and half-and-half. Whole or full-fat cream cheese. Whole-fat or sweetened yogurt. Full-fat cheese. Nondairy creamers. Whipped toppings. Processed cheese and cheese spreads. Fats and oils Butter. Stick margarine. Lard. Shortening. Ghee. Bacon fat. Tropical oils, such as coconut, palm kernel, or palm oil. Seasoning and other foods Salted popcorn  and pretzels. Onion salt, garlic salt, seasoned salt, table salt, and sea salt. Worcestershire sauce. Tartar sauce. Barbecue sauce. Teriyaki sauce. Soy sauce, including reduced-sodium. Steak sauce. Canned and packaged gravies. Fish sauce. Oyster sauce. Cocktail sauce. Horseradish that you find on the shelf. Ketchup. Mustard. Meat flavorings and tenderizers. Bouillon cubes. Hot sauce and Tabasco sauce. Premade or packaged marinades. Premade or packaged taco seasonings. Relishes. Regular salad dressings. Where to find more information:  National Heart, Lung, and [place]: https://wilson-eaton.com/  American Heart Association: www.heart.org Summary  The DASH eating plan is a healthy eating plan that has been shown to reduce high blood pressure (hypertension). It may also reduce your risk for type 2 diabetes, heart disease, and stroke.  With the DASH eating plan, you should limit salt (sodium) intake to 2,300 mg a day. If you have hypertension, you may need to reduce your sodium intake to 1,500 mg a day.  When on the DASH eating plan, aim to eat more fresh fruits and vegetables, whole grains, lean proteins, low-fat dairy, and heart-healthy fats.  Work with your health care provider or diet and nutrition specialist (dietitian) to adjust your eating plan to your individual calorie needs. This information is  not intended to replace advice given to you by your health care provider. Make sure you discuss any questions you have with your health care provider. Document Released: 02/14/2011 Document Revised: 02/07/2017 Document Reviewed: 02/19/2016 Elsevier Patient Education  2020 [employer].

## 2018-10-10 DIAGNOSIS — R21 Rash and other nonspecific skin eruption: Secondary | ICD-10-CM

## 2018-10-10 DIAGNOSIS — K047 Periapical abscess without sinus: Secondary | ICD-10-CM

## 2018-10-10 HISTORY — DX: Rash and other nonspecific skin eruption: R21

## 2018-10-10 HISTORY — DX: Periapical abscess without sinus: K04.7

## 2018-10-26 ENCOUNTER — Other Ambulatory Visit: Payer: Self-pay | Admitting: Family Medicine

## 2018-10-26 ENCOUNTER — Telehealth: Payer: Self-pay

## 2018-10-26 DIAGNOSIS — G629 Polyneuropathy, unspecified: Secondary | ICD-10-CM

## 2018-10-26 DIAGNOSIS — E785 Hyperlipidemia, unspecified: Secondary | ICD-10-CM

## 2018-10-26 DIAGNOSIS — M5442 Lumbago with sciatica, left side: Secondary | ICD-10-CM

## 2018-10-26 DIAGNOSIS — E119 Type 2 diabetes mellitus without complications: Secondary | ICD-10-CM

## 2018-10-26 DIAGNOSIS — I10 Essential (primary) hypertension: Secondary | ICD-10-CM

## 2018-10-26 DIAGNOSIS — M542 Cervicalgia: Secondary | ICD-10-CM

## 2018-10-26 DIAGNOSIS — G8929 Other chronic pain: Secondary | ICD-10-CM

## 2018-10-26 MED ORDER — TAMSULOSIN HCL 0.4 MG PO CAPS: 0.4000 mg | ORAL_CAPSULE | Freq: Every day | ORAL | 3 refills | Status: DC

## 2018-10-26 MED ORDER — ATORVASTATIN CALCIUM 40 MG PO TABS: 40.0000 mg | ORAL_TABLET | Freq: Every day | ORAL | 3 refills | Status: DC

## 2018-10-26 MED ORDER — METOPROLOL TARTRATE 25 MG PO TABS: 25.0000 mg | ORAL_TABLET | Freq: Two times a day (BID) | ORAL | 3 refills | Status: DC

## 2018-10-26 MED ORDER — GLIPIZIDE 10 MG PO TABS: 10.0000 mg | ORAL_TABLET | Freq: Two times a day (BID) | ORAL | 3 refills | Status: DC

## 2018-10-26 MED ORDER — METHOCARBAMOL 750 MG PO TABS: 750.0000 mg | ORAL_TABLET | Freq: Two times a day (BID) | ORAL | 3 refills | Status: DC

## 2018-10-26 MED ORDER — HYDROCORTISONE 0.5 % EX CREA: 1.0000 "application " | TOPICAL_CREAM | Freq: Two times a day (BID) | CUTANEOUS | 3 refills | Status: DC

## 2018-10-26 MED ORDER — LOSARTAN POTASSIUM 50 MG PO TABS: 100.0000 mg | ORAL_TABLET | Freq: Every day | ORAL | 3 refills | Status: DC

## 2018-10-26 MED ORDER — GABAPENTIN 300 MG PO CAPS: 300.0000 mg | ORAL_CAPSULE | Freq: Two times a day (BID) | ORAL | 3 refills | Status: DC

## 2018-10-26 MED FILL — ?GLIPIZIDE 10 MG TABLET: 10 | 30 days supply | Qty: 60 | Fill #0

## 2018-10-26 MED FILL — ?METOPROLOL 25 MG TABLET: 25 | 30 days supply | Qty: 60 | Fill #0

## 2018-10-26 MED FILL — METHOCARBAMOL 750 MG TABS: 750 | 30 days supply | Qty: 60 | Fill #0

## 2018-10-26 MED FILL — GABAPENTIN 300 MG CAPSULE: 300 | 30 days supply | Qty: 60 | Fill #0

## 2018-10-26 MED FILL — TAMSULOSIN HCL 0.4 MG CAP: 0.4 | 30 days supply | Qty: 30 | Fill #0

## 2018-10-26 MED FILL — ATORVASTATIN CALCIUM 40 MG: 40 | 30 days supply | Qty: 30 | Fill #0

## 2018-10-26 MED FILL — LOSARTAN POTASSIUM 50 MG TA: 50 | 30 days supply | Qty: 60 | Fill #0

## 2018-10-27 NOTE — Telephone Encounter (Signed)
Sent to provider 

## 2018-10-30 ENCOUNTER — Encounter (HOSPITAL_COMMUNITY): Payer: Self-pay

## 2018-11-02 ENCOUNTER — Ambulatory Visit (INDEPENDENT_AMBULATORY_CARE_PROVIDER_SITE_OTHER): Payer: Self-pay | Admitting: Family Medicine

## 2018-11-02 ENCOUNTER — Other Ambulatory Visit: Payer: Self-pay

## 2018-11-02 ENCOUNTER — Encounter: Payer: Self-pay | Admitting: Family Medicine

## 2018-11-02 VITALS — BP 140/93 | HR 79 | Temp 98.1°F | Ht 71.0 in | Wt 252.6 lb

## 2018-11-02 DIAGNOSIS — K047 Periapical abscess without sinus: Secondary | ICD-10-CM

## 2018-11-02 DIAGNOSIS — I10 Essential (primary) hypertension: Secondary | ICD-10-CM

## 2018-11-02 DIAGNOSIS — R21 Rash and other nonspecific skin eruption: Secondary | ICD-10-CM

## 2018-11-02 DIAGNOSIS — K0889 Other specified disorders of teeth and supporting structures: Secondary | ICD-10-CM

## 2018-11-02 DIAGNOSIS — Z09 Encounter for follow-up examination after completed treatment for conditions other than malignant neoplasm: Secondary | ICD-10-CM

## 2018-11-02 MED ORDER — AMOXICILLIN 500 MG PO TABS: 500.0000 mg | ORAL_TABLET | Freq: Two times a day (BID) | ORAL | 0 refills | Status: DC

## 2018-11-02 MED ORDER — CLOTRIMAZOLE-BETAMETHASONE 1-0.05 % EX CREA: 1.0000 "application " | TOPICAL_CREAM | Freq: Two times a day (BID) | CUTANEOUS | 3 refills | Status: DC

## 2018-11-02 MED ORDER — IBUPROFEN 800 MG PO TABS: 800.0000 mg | ORAL_TABLET | Freq: Three times a day (TID) | ORAL | 3 refills | Status: DC | PRN

## 2018-11-02 MED FILL — AMOXICILLIN 500 MG CAPSULE: 500 | 10 days supply | Qty: 20 | Fill #0

## 2018-11-02 MED FILL — CLOTRIMAZOLE-BETAMETHASONE: 1-0.05 | 15 days supply | Qty: 30 | Fill #0

## 2018-11-02 MED FILL — ?IBUPROFEN 800 MG TABS: AMNEAL | 10 days supply | Qty: 30 | Fill #0

## 2018-11-02 NOTE — Patient Instructions (Signed)
Rash, Adult  A rash is a change in the color of your skin. A rash can also change the way your skin feels. There are many different conditions and factors that can cause a rash. Follow these instructions at home: The goal of treatment is to stop the itching and keep the rash from spreading. Watch for any changes in your symptoms. Let your doctor know about them. Follow these instructions to help with your condition: Medicine Take or apply over-the-counter and prescription medicines only as told by your doctor. These may include medicines:  To treat red or swollen skin (corticosteroid creams).  To treat itching.  To treat an allergy (oral antihistamines).  To treat very bad symptoms (oral corticosteroids).  Skin care  Put cool cloths (compresses) on the affected areas.  Do not scratch or rub your skin.  Avoid covering the rash. Make sure that the rash is exposed to air as much as possible. Managing itching and discomfort  Avoid hot showers or baths. These can make itching worse. A cold shower may help.  Try taking a bath with: ? Epsom salts. You can get these at your local pharmacy or grocery store. Follow the instructions on the package. ? Baking soda. Pour a small amount into the bath as told by your doctor. ? Colloidal oatmeal. You can get this at your local pharmacy or grocery store. Follow the instructions on the package.  Try putting baking soda paste onto your skin. Stir water into baking soda until it gets like a paste.  Try putting on a lotion that relieves itchiness (calamine lotion).  Keep cool and out of the sun. Sweating and being hot can make itching worse. General instructions   Rest as needed.  Drink enough fluid to keep your pee (urine) pale yellow.  Wear loose-fitting clothing.  Avoid scented soaps, detergents, and perfumes. Use gentle soaps, detergents, perfumes, and other cosmetic products.  Avoid anything that causes your rash. Keep a journal to  help track what causes your rash. Write down: ? What you eat. ? What cosmetic products you use. ? What you drink. ? What you wear. This includes jewelry.  Keep all follow-up visits as told by your doctor. This is important. Contact a doctor if:  You sweat at night.  You lose weight.  You pee (urinate) more than normal.  You pee less than normal, or you notice that your pee is a darker color than normal.  You feel weak.  You throw up (vomit).  Your skin or the whites of your eyes look yellow (jaundice).  Your skin: ? Tingles. ? Is numb.  Your rash: ? Does not go away after a few days. ? Gets worse.  You are: ? More thirsty than normal. ? More tired than normal.  You have: ? New symptoms. ? Pain in your belly (abdomen). ? A fever. ? Watery poop (diarrhea). Get help right away if:  You have a fever and your symptoms suddenly get worse.  You start to feel mixed up (confused).  You have a very bad headache or a stiff neck.  You have very bad joint pains or stiffness.  You have jerky movements that you cannot control (seizure).  Your rash covers all or most of your body. The rash may or may not be painful.  You have blisters that: ? Are on top of the rash. ? Grow larger. ? Grow together. ? Are painful. ? Are inside your nose or mouth.  You have a rash  that: ? Looks like purple pinprick-sized spots all over your body. ? Has a "bull's eye" or looks like a target. ? Is red and painful, causes your skin to peel, and is not from being in the sun too long. Summary  A rash is a change in the color of your skin. A rash can also change the way your skin feels.  The goal of treatment is to stop the itching and keep the rash from spreading.  Take or apply over-the-counter and prescription medicines only as told by your doctor.  Contact a doctor if you have new symptoms or symptoms that get worse.  Keep all follow-up visits as told by your doctor. This is  important. This information is not intended to replace advice given to you by your health care provider. Make sure you discuss any questions you have with your health care provider. Document Released: 08/14/2007 Document Revised: 06/19/2018 Document Reviewed: 09/29/2017 Elsevier Patient Education  [place]. Betamethasone; Clotrimazole skin cream What is this medicine? BETAMETHASONE; CLOTRIMAZOLE (bay ta METH a sone; kloe TRIM a zole) is a corticosteroid and antifungal cream. It treats ringworm and infections like jock itch and athlete's foot. It also helps reduce swelling, redness, and itching caused by these infections. This medicine may be used for other purposes; ask your health care provider or pharmacist if you have questions. COMMON BRAND NAME(S): Lotrisone What should I tell my health care provider before I take this medicine? They need to know if you have any of these conditions:  large areas of burned or damaged skin  skin thinning  peripheral vascular disease or poor circulation  an unusual or allergic reaction to betamethasone, clotrimazole, other corticosteroids, other antifungals, other medicines, foods, dyes, or preservatives  pregnant or trying to get pregnant  breast-feeding How should I use this medicine? This cream is for external use only. Do not take by mouth. Follow the directions on the prescription label. Wash your hands before and after use. If treating hand or nail infections, wash hands before use only. Apply a thin layer of cream to the affected area and rub in gently. Do not cover or wrap the treated area with an airtight bandage (like a plastic bandage). Use the cream for the full course of treatment prescribed, even if you think the condition is getting better. Use the medicine at regular intervals. Do not use more often than directed. Do not use on healthy skin or over large areas of skin. Do not use this medicine for any condition other than the one  for which it was prescribed. When applying to the groin area, apply a small amount and do not use for longer than 2 weeks unless directed to by your doctor or health care professional. Do not get this cream in your eyes. If you do, rinse out with plenty of cool tap water. Talk to your pediatrician regarding the use of this medicine in children. While this drug may be prescribed for children as young as 17 years for selected conditions, precautions do apply. Patients over 74 years old may have a stronger reaction and need a smaller dose. Overdosage: If you think you have taken too much of this medicine contact a poison control center or emergency room at once. NOTE: This medicine is only for you. Do not share this medicine with others. What if I miss a dose? If you miss a dose, use it as soon as you can. If it is almost time for your next dose,  use only that dose. Do not use double or take extra doses. What may interact with this medicine?  topical products that have nystatin This list may not describe all possible interactions. Give your health care provider a list of all the medicines, herbs, non-prescription drugs, or dietary supplements you use. Also tell them if you smoke, drink alcohol, or use illegal drugs. Some items may interact with your medicine. What should I watch for while using this medicine? If using this medicine on your body or groin tell your doctor or health care professional if your symptoms do not improve within 1 week. If using this medicine on your feet tell your doctor or health care professional if your symptoms do not improve within 2 weeks. Tell your doctor if your skin infection returns after you stop using this cream. If you are using this cream for 'jock itch' be sure to dry the groin completely after bathing. Do not wear underwear that is tight-fitting or made from synthetic fibers like rayon or nylon. Wear loose-fitting, cotton underwear. If you are using this cream for  athlete's foot be sure to dry your feet carefully after bathing, especially between the toes. Do not wear socks made from wool or synthetic materials like rayon or nylon. Wear clean cotton socks and change them at least once a day, change them more if your feet sweat a lot. Also, try to wear sandals or shoes that are well-ventilated. Do not use this cream to treat diaper rash. What side effects may I notice from receiving this medicine? Side effects that you should report to your doctor or health care professional as soon as possible:  allergic reactions like skin rash, itching or hives, swelling of the face, lips, or tongue  dark red spots on the skin  lack of healing of skin condition  loss of feeling on skin  painful, red, pus-filled blisters in hair follicles  skin infection  sores or blisters that do not heal properly  thinning of the skin or sunburn Side effects that usually do not require medical attention (report to your doctor or health care professional if they continue or are bothersome):  dry or peeling skin  minor skin irritation, burning, or itching This list may not describe all possible side effects. Call your doctor for medical advice about side effects. You may report side effects to FDA at 1-800-FDA-1088. Where should I keep my medicine? Keep out of the reach of children. Store at room temperature between 15 and 30 degrees C ( 59 and 86 degrees F). Do not freeze. Throw away any unused medicine after the expiration date. NOTE: This sheet is a summary. It may not cover all possible information. If you have questions about this medicine, talk to your doctor, pharmacist, or health care provider.  2020 Elsevier/Gold Standard (2007-05-27 16:14:28) Ibuprofen tablets and capsules What is this medicine? IBUPROFEN (eye BYOO proe fen) is a non-steroidal anti-inflammatory drug (NSAID). It is used for dental pain, fever, headaches or migraines, osteoarthritis, rheumatoid  arthritis, or painful monthly periods. It can also relieve minor aches and pains caused by a cold, flu, or sore throat. This medicine may be used for other purposes; ask your health care provider or pharmacist if you have questions. COMMON BRAND NAME(S): Advil, Advil Junior Strength, Advil Migraine, Genpril, Ibren, IBU, Midol, Midol Cramps and Body Aches, Motrin, Motrin IB, Motrin Junior Strength, Motrin Migraine Pain, Samson-8, Toxicology Saliva Collection What should I tell my health care provider before I take this medicine?  They need to know if you have any of these conditions:  cigarette smoker  coronary artery bypass graft (CABG) surgery within the past 2 weeks  drink more than 3 alcohol-containing drinks a day  heart disease  high blood pressure  history of stomach bleeding  kidney disease  liver disease  lung or breathing disease, like asthma  an unusual or allergic reaction to ibuprofen, aspirin, other NSAIDs, other medicines, foods, dyes, or preservatives  pregnant or trying to get pregnant  breast-feeding How should I use this medicine? Take this medicine by mouth with a glass of water. Follow the directions on the prescription label. Take this medicine with food if your stomach gets upset. Try to not lie down for at least 10 minutes after you take the medicine. Take your medicine at regular intervals. Do not take your medicine more often than directed. A special MedGuide will be given to you by the pharmacist with each prescription and refill. Be sure to read this information carefully each time. Talk to your pediatrician regarding the use of this medicine in children. Special care may be needed. Overdosage: If you think you have taken too much of this medicine contact a poison control center or emergency room at once. NOTE: This medicine is only for you. Do not share this medicine with others. What if I miss a dose? If you miss a dose, take it as soon as you can. If  it is almost time for your next dose, take only that dose. Do not take double or extra doses. What may interact with this medicine? Do not take this medicine with any of the following medications:  cidofovir  ketorolac  methotrexate  pemetrexed This medicine may also interact with the following medications:  alcohol  aspirin  diuretics  lithium  other drugs for inflammation like prednisone  warfarin This list may not describe all possible interactions. Give your health care provider a list of all the medicines, herbs, non-prescription drugs, or dietary supplements you use. Also tell them if you smoke, drink alcohol, or use illegal drugs. Some items may interact with your medicine. What should I watch for while using this medicine? Tell your doctor or healthcare provider if your symptoms do not start to get better or if they get worse. This medicine may cause serious skin reactions. They can happen weeks to months after starting the medicine. Contact your healthcare provider right away if you notice fevers or flu-like symptoms with a rash. The rash may be red or purple and then turn into blisters or peeling of the skin. Or, you might notice a red rash with swelling of the face, lips or lymph nodes in your neck or under your arms. This medicine does not prevent heart attack or stroke. In fact, this medicine may increase the chance of a heart attack or stroke. The chance may increase with longer use of this medicine and in people who have heart disease. If you take aspirin to prevent heart attack or stroke, talk with your doctor or healthcare provider. Do not take other medicines that contain aspirin, ibuprofen, or naproxen with this medicine. Side effects such as stomach upset, nausea, or ulcers may be more likely to occur. Many medicines available without a prescription should not be taken with this medicine. This medicine can cause ulcers and bleeding in the stomach and intestines at  any time during treatment. Ulcers and bleeding can happen without warning symptoms and can cause death. To reduce your risk, do not  smoke cigarettes or drink alcohol while you are taking this medicine. You may get drowsy or dizzy. Do not drive, use machinery, or do anything that needs mental alertness until you know how this medicine affects you. Do not stand or sit up quickly, especially if you are an older patient. This reduces the risk of dizzy or fainting spells. This medicine can cause you to bleed more easily. Try to avoid damage to your teeth and gums when you brush or floss your teeth. This medicine may be used to treat migraines. If you take migraine medicines for 10 or more days a month, your migraines may get worse. Keep a diary of headache days and medicine use. Contact your healthcare provider if your migraine attacks occur more frequently. What side effects may I notice from receiving this medicine? Side effects that you should report to your doctor or health care professional as soon as possible:  allergic reactions like skin rash, itching or hives, swelling of the face, lips, or tongue  redness, blistering, peeling or loosening of the skin, including inside the mouth  severe stomach pain  signs and symptoms of bleeding such as bloody or black, tarry stools; red or dark-brown urine; spitting up blood or brown material that looks like coffee grounds; red spots on the skin; unusual bruising or bleeding from the eye, gums, or nose  signs and symptoms of a blood clot such as changes in vision; chest pain; severe, sudden headache; trouble speaking; sudden numbness or weakness of the face, arm, or leg  unexplained weight gain or swelling  unusually weak or tired  yellowing of eyes or skin Side effects that usually do not require medical attention (report to your doctor or health care professional if they continue or are bothersome):  bruising  diarrhea  dizziness,  drowsiness  headache  nausea, vomiting This list may not describe all possible side effects. Call your doctor for medical advice about side effects. You may report side effects to FDA at 1-800-FDA-1088. Where should I keep my medicine? Keep out of the reach of children. Store at room temperature between 15 and 30 degrees C (59 and 86 degrees F). Keep container tightly closed. Throw away any unused medicine after the expiration date. NOTE: This sheet is a summary. It may not cover all possible information. If you have questions about this medicine, talk to your doctor, pharmacist, or health care provider.  2020 Elsevier/Gold Standard (2018-05-13 14:11:00) Dental Abscess  A dental abscess is an area of pus in or around a tooth. It comes from an infection. It can cause pain and other symptoms. Treatment will help with symptoms and prevent the infection from spreading. Follow these instructions at home: Medicines  Take over-the-counter and prescription medicines only as told by your dentist.  If you were prescribed an antibiotic medicine, take it as told by your dentist. Do not stop taking it even if you start to feel better.  If you were prescribed a gel that has numbing medicine in it, use it exactly as told.  Do not drive or use heavy machinery (like a Conservation officer, nature) while taking prescription pain medicine. General instructions  Rinse out your mouth often with salt water. ? To make salt water, dissolve -1 tsp of salt in 1 cup of warm water.  Eat a soft diet while your mouth is healing.  Drink enough fluid to keep your urine pale yellow.  Do not apply heat to the outside of your mouth.  Do not use  any products that contain nicotine or tobacco. These include cigarettes and e-cigarettes. If you need help quitting, ask your doctor.  Keep all follow-up visits as told by your dentist. This is important. Prevent an abscess  Brush your teeth every morning and every night. Use fluoride  toothpaste.  Floss your teeth each day.  Get dental cleanings as often as told by your dentist.  Think about getting dental sealant put on teeth that have deep holes (decay).  Drink water that has fluoride in it. ? Most tap water has fluoride. ? Check the label on bottled water to see if it has fluoride in it.  Drink water instead of sugary drinks.  Eat healthy meals and snacks.  Wear a mouth guard or face shield when you play sports. Contact a doctor if:  Your pain is worse, and medicine does not help. Get help right away if:  You have a fever or chills.  Your symptoms suddenly get worse.  You have a very bad headache.  You have problems breathing or swallowing.  You have trouble opening your mouth.  You have swelling in your neck or close to your eye. Summary  A dental abscess is an area of pus in or around a tooth. It is caused by an infection.  Treatment will help with symptoms and prevent the infection from spreading.  Take over-the-counter and prescription medicines only as told by your dentist.  To prevent an abscess, take good care of your teeth. Brush your teeth every morning and night. Use floss every day.  Get dental cleanings as often as told by your dentist. This information is not intended to replace advice given to you by your health care provider. Make sure you discuss any questions you have with your health care provider. Document Released: 07/12/2014 Document Revised: 06/17/2018 Document Reviewed: 10/28/2016 Elsevier Patient Education  2020 [employer].

## 2018-11-02 NOTE — Progress Notes (Signed)
Patient [facility]   Sick Visit  Subjective:  Patient ID: Jon Harper, male    DOB: [birth year]  Age: 63 y.o. MRN: [id number]  CC:  Chief Complaint  Patient presents with  . Dental Pain    having tooth , swelling in face , x 3 days     HPI Jon Harper ia a 63 year old male who presents for Follow Up today.   Past Medical History:  Diagnosis Date  . Chronic low back pain   . Diabetes mellitus   . Hypertension   . Neck pain   . Neck pain   . Skin rash 10/2018  . Tooth abscess 10/2018   Current Status: Since his last office visit, he is doing well with no complaints. He has c/o tooth abscess X 1 week now. He has increased pain, which he has taken Motrin 800 mg with minimal relief.  He denies visual changes, chest pain, cough, shortness of breath, heart palpitations, and falls. He has occasional headaches and dizziness with position changes. Denies severe headaches, confusion, seizures, double vision, and blurred vision, nausea and vomiting. His anxiety is moderated today, r/t his tooth abscess. She denies suicidal ideations, homicidal ideations, or auditory hallucinations. He denies fevers, chills, fatigue, recent infections, weight loss, and night sweats.  No reports of GI problems such as nausea, vomiting, diarrhea, and constipation. He has no reports of blood in stools, dysuria and hematuria. He denies pain today.   Past Surgical History:  Procedure Laterality Date  . right nephrectomy      Family History  Problem Relation Age of Onset  . Diabetes Mother        non-insulin   . Diabetes Father        non-insulin   . Diabetes Sister        non-insulin     Social History   Socioeconomic History  . Marital status: Married    Spouse name: Not on file  . Number of children: Not on file  . Years of education: Not on file  . Highest education level: Not on file  Occupational History  . Not on file  Social Needs  .  Financial resource strain: Not on file  . Food insecurity    Worry: Not on file    Inability: Not on file  . Transportation needs    Medical: Not on file    Non-medical: Not on file  Tobacco Use  . Smoking status: Never Smoker  . Smokeless tobacco: Never Used  Substance and Sexual Activity  . Alcohol use: No    Frequency: Never  . Drug use: No  . Sexual activity: Not on file  Lifestyle  . Physical activity    Days per week: Not on file    Minutes per session: Not on file  . Stress: Not on file  Relationships  . Social Herbalist on phone: Not on file    Gets together: Not on file    Attends religious service: Not on file    Active member of club or organization: Not on file    Attends meetings of clubs or organizations: Not on file    Relationship status: Not on file  . Intimate partner violence    Fear of current or ex partner: Not on file    Emotionally abused: Not on file    Physically abused: Not on file    Forced sexual activity:  Not on file  Other Topics Concern  . Not on file  Social History Narrative   Divorced.   Does exercise as much as possible.  Tries to go to gym at least 3 days a week.    Outpatient Medications Prior to Visit  Medication Sig Dispense Refill  . atorvastatin (LIPITOR) 40 MG tablet Take 1 tablet (40 mg total) by mouth daily. 30 tablet 3  . gabapentin (NEURONTIN) 300 MG capsule Take 1 capsule (300 mg total) by mouth 2 (two) times daily. 60 capsule 3  . glipiZIDE (GLUCOTROL) 10 MG tablet Take 1 tablet (10 mg total) by mouth 2 (two) times daily before a meal. 60 tablet 3  . losartan (COZAAR) 50 MG tablet Take 2 tablets (100 mg total) by mouth daily. 60 tablet 3  . methocarbamol (ROBAXIN) 750 MG tablet Take 1 tablet (750 mg total) by mouth 2 (two) times daily. 60 tablet 3  . metoprolol tartrate (LOPRESSOR) 25 MG tablet Take 1 tablet (25 mg total) by mouth 2 (two) times daily. 60 tablet 3  . tamsulosin (FLOMAX) 0.4 MG CAPS capsule Take  1 capsule (0.4 mg total) by mouth daily. 30 capsule 3  . hydrocortisone cream 0.5 % Apply 1 application topically 2 (two) times daily. (Patient not taking: Reported on 11/02/2018) 30 g 3   No facility-administered medications prior to visit.     No Known Allergies  ROS Review of Systems  Constitutional: Positive for fatigue.  HENT: Negative.   Eyes: Negative.   Respiratory: Negative.   Cardiovascular: Negative.   Gastrointestinal: Positive for abdominal distention (obese).  Endocrine: Negative.   Genitourinary: Negative.   Musculoskeletal: Positive for arthralgias (generalized).  Skin: Negative.   Allergic/Immunologic: Negative.   Neurological: Positive for dizziness (occasional ) and headaches (occasional ).  Hematological: Negative.   Psychiatric/Behavioral: Negative.       Objective:    Physical Exam  Constitutional: He is oriented to person, place, and time. He appears well-developed and well-nourished.  HENT:  Head: Normocephalic and atraumatic.  Eyes: Conjunctivae are normal.  Neck: Normal range of motion. Neck supple.  Cardiovascular: Normal rate, regular rhythm, normal heart sounds and intact distal pulses.  Pulmonary/Chest: Effort normal and breath sounds normal.  Abdominal: Soft. Bowel sounds are normal.  Musculoskeletal: Normal range of motion.  Neurological: He is alert and oriented to person, place, and time. He has normal reflexes.  Skin: Skin is warm and dry.  Psychiatric: He has a normal mood and affect. His behavior is normal. Judgment and thought content normal.  Nursing note and vitals reviewed.   BP (!) 140/93 (BP Location: Left Arm, Patient Position: Sitting, Cuff Size: Large)   Pulse 79   Temp 98.1 F (36.7 C) (Oral)   Ht 5\' 11"  (1.803 m)   Wt 252 lb 9.6 oz (114.6 kg)   BMI 35.23 kg/m  Wt Readings from Last 3 Encounters:  11/02/18 252 lb 9.6 oz (114.6 kg)  09/30/18 251 lb (113.9 kg)  04/01/18 254 lb (115.2 kg)     Health Maintenance Due   Topic Date Due  . FOOT EXAM  06/02/1967  . OPHTHALMOLOGY EXAM  06/02/1967  . COLONOSCOPY  06/02/2007  . INFLUENZA VACCINE  10/10/2018    There are no preventive care reminders to display for this patient.  Lab Results  Component Value Date   TSH 1.370 04/16/2017   Lab Results  Component Value Date   WBC 11.2 (H) 01/30/2018   HGB 14.0 01/30/2018   HCT  41.5 01/30/2018   MCV 84.0 01/30/2018   PLT 173 01/30/2018   Lab Results  Component Value Date   NA 137 01/30/2018   K 3.7 01/30/2018   CO2 25 01/30/2018   GLUCOSE 127 (H) 01/30/2018   BUN 27 (H) 01/30/2018   CREATININE 1.38 (H) 01/30/2018   BILITOT 0.6 01/30/2018   ALKPHOS 43 01/30/2018   AST 17 01/30/2018   ALT 47 (H) 01/30/2018   PROT 7.3 01/30/2018   ALBUMIN 3.8 01/30/2018   CALCIUM 9.1 01/30/2018   ANIONGAP 7 01/30/2018   Lab Results  Component Value Date   CHOL 206 (H) 04/16/2017   Lab Results  Component Value Date   HDL 60 04/16/2017   Lab Results  Component Value Date   LDLCALC 133 (H) 04/16/2017   Lab Results  Component Value Date   TRIG 66 04/16/2017   Lab Results  Component Value Date   CHOLHDL 3.4 04/16/2017   Lab Results  Component Value Date   HGBA1C 7.2 (A) 09/30/2018   Assessment & Plan:   1. Tooth abscess We will initiate Amoxicillin today.  - amoxicillin (AMOXIL) 500 MG tablet; Take 1 tablet (500 mg total) by mouth 2 (two) times daily.  Dispense: 20 tablet; Refill: 0 - ibuprofen (ADVIL) 800 MG tablet; Take 1 tablet (800 mg total) by mouth every 8 (eight) hours as needed.  Dispense: 30 tablet; Refill: 3  2. Tooth pain We will initiate Ibuprofen today.  - ibuprofen (ADVIL) 800 MG tablet; Take 1 tablet (800 mg total) by mouth every 8 (eight) hours as needed.  Dispense: 30 tablet; Refill: 3  3. Skin rash - clotrimazole-betamethasone (LOTRISONE) cream; Apply 1 application topically 2 (two) times daily.  Dispense: 30 g; Refill: 3  4. Hypertension, unspecified type The current  medical regimen is effective; blood pressure is stable at 140/93 today; continue present plan and medications as prescribed. She will continue to decrease high sodium intake, excessive alcohol intake, increase potassium intake, smoking cessation, and increase physical activity of at least 30 minutes of cardio activity daily. She will continue to follow Heart Healthy or DASH diet.  5. Follow up He will keep follow up appointment as scheduled.   Meds ordered this encounter  Medications  . amoxicillin (AMOXIL) 500 MG tablet    Sig: Take 1 tablet (500 mg total) by mouth 2 (two) times daily.    Dispense:  20 tablet    Refill:  0  . clotrimazole-betamethasone (LOTRISONE) cream    Sig: Apply 1 application topically 2 (two) times daily.    Dispense:  30 g    Refill:  3  . ibuprofen (ADVIL) 800 MG tablet    Sig: Take 1 tablet (800 mg total) by mouth every 8 (eight) hours as needed.    Dispense:  30 tablet    Refill:  3    No orders of the defined types were placed in this encounter.   Referral Orders  No referral(s) requested today    Kathe Becton,  MSN, FNP-BC [place], [place] [address] [phone] fax  Problem List Items Addressed This Visit    None    Visit Diagnoses    Tooth abscess    -  Primary   Relevant Medications   amoxicillin (AMOXIL) 500 MG tablet   ibuprofen (ADVIL) 800 MG tablet   Tooth pain       Relevant Medications  ibuprofen (ADVIL) 800 MG tablet   Skin rash       Relevant Medications   clotrimazole-betamethasone (LOTRISONE) cream   Hypertension, unspecified type       Follow up          Meds ordered this encounter  Medications  . amoxicillin (AMOXIL) 500 MG tablet    Sig: Take 1 tablet (500 mg total) by mouth 2 (two) times daily.    Dispense:  20 tablet    Refill:  0  . clotrimazole-betamethasone (LOTRISONE) cream    Sig: Apply 1  application topically 2 (two) times daily.    Dispense:  30 g    Refill:  3  . ibuprofen (ADVIL) 800 MG tablet    Sig: Take 1 tablet (800 mg total) by mouth every 8 (eight) hours as needed.    Dispense:  30 tablet    Refill:  3    Follow-up: No follow-ups on file.    Azzie Glatter, FNP

## 2018-11-03 DIAGNOSIS — K0889 Other specified disorders of teeth and supporting structures: Secondary | ICD-10-CM | POA: Insufficient documentation

## 2018-11-03 DIAGNOSIS — R21 Rash and other nonspecific skin eruption: Secondary | ICD-10-CM | POA: Insufficient documentation

## 2018-11-03 DIAGNOSIS — K047 Periapical abscess without sinus: Secondary | ICD-10-CM | POA: Insufficient documentation

## 2018-12-23 MED FILL — LOSARTAN POTASSIUM 50 MG TA: 50 | 30 days supply | Qty: 60 | Fill #1

## 2018-12-23 MED FILL — GABAPENTIN 300 MG CAPSULE: 300 | 30 days supply | Qty: 60 | Fill #1

## 2018-12-23 MED FILL — ?METOPROLOL 25 MG TABLET: 25 | 30 days supply | Qty: 60 | Fill #1

## 2018-12-23 MED FILL — ?GLIPIZIDE 10 MG TABLET: 10 | 30 days supply | Qty: 60 | Fill #1

## 2019-02-02 ENCOUNTER — Encounter: Payer: Self-pay | Admitting: Family Medicine

## 2019-02-02 ENCOUNTER — Other Ambulatory Visit: Payer: Self-pay

## 2019-02-02 ENCOUNTER — Ambulatory Visit (INDEPENDENT_AMBULATORY_CARE_PROVIDER_SITE_OTHER): Payer: Self-pay | Admitting: Family Medicine

## 2019-02-02 VITALS — BP 118/71 | HR 88 | Temp 98.0°F | Ht 71.0 in | Wt 252.3 lb

## 2019-02-02 DIAGNOSIS — L602 Onychogryphosis: Secondary | ICD-10-CM

## 2019-02-02 DIAGNOSIS — R7309 Other abnormal glucose: Secondary | ICD-10-CM

## 2019-02-02 DIAGNOSIS — Z09 Encounter for follow-up examination after completed treatment for conditions other than malignant neoplasm: Secondary | ICD-10-CM

## 2019-02-02 DIAGNOSIS — G8929 Other chronic pain: Secondary | ICD-10-CM

## 2019-02-02 DIAGNOSIS — R809 Proteinuria, unspecified: Secondary | ICD-10-CM

## 2019-02-02 DIAGNOSIS — M2550 Pain in unspecified joint: Secondary | ICD-10-CM

## 2019-02-02 DIAGNOSIS — E119 Type 2 diabetes mellitus without complications: Secondary | ICD-10-CM

## 2019-02-02 LAB — POCT URINALYSIS DIPSTICK
Bilirubin, UA: NEGATIVE
Glucose, UA: NEGATIVE
Ketones, UA: NEGATIVE
Leukocytes, UA: NEGATIVE
Nitrite, UA: NEGATIVE
Protein, UA: POSITIVE — AB
Spec Grav, UA: 1.02 (ref 1.010–1.025)
Urobilinogen, UA: 0.2 E.U./dL
pH, UA: 6 (ref 5.0–8.0)

## 2019-02-02 LAB — POCT GLYCOSYLATED HEMOGLOBIN (HGB A1C): Hemoglobin A1C: 6.5 % — AB (ref 4.0–5.6)

## 2019-02-02 LAB — GLUCOSE, POCT (MANUAL RESULT ENTRY): POC Glucose: 83 mg/dl (ref 70–99)

## 2019-02-02 MED ORDER — KETOROLAC TROMETHAMINE 60 MG/2ML IM SOLN
60.0000 mg | Freq: Once | INTRAMUSCULAR | Status: AC
  Administered 2019-02-02: 16:00:00 60 mg via INTRAMUSCULAR

## 2019-02-02 NOTE — Progress Notes (Signed)
Patient [name]   Established Patient Office Visit  Subjective:  Patient ID: Jon Harper, male    DOB: [birth year]  Age: 63 y.o. MRN: [id number]  CC:  Chief Complaint  Patient presents with  . Follow-up  . Back Pain    back pain, knee pain, ankle pain, neck pain  . Nail Problem    Over growth toe nail on RT greater toe    HPI Jon Harper is a 63 year old male who presents for Follow Up today.   Past Medical History:  Diagnosis Date  . Chronic low back pain   . Diabetes mellitus   . Hypertension   . Neck pain   . Neck pain   . Skin rash 10/2018  . Tooth abscess 10/2018   Current Status: Since his last office visit, he has had complaints of chronic multiple joint pain. He has c/o of overgrown toenails. He has seen Podiatrist, Dr. Earleen Newport for foot issues. He denies visual changes, chest pain, cough, shortness of breath, heart palpitations, and falls. He has occasional headaches and dizziness with position changes. Denies severe headaches, confusion, seizures, double vision, and blurred vision, nausea and vomiting.  He has not been monitoring his blood glucose levels lately. He denies fatigue, frequent urination, blurred vision, excessive hunger, excessive thirst, weight gain, weight loss, and poor wound healing. He continues to check his feet regularly. He denies fevers, chills, recent infections, weight loss, and night sweats. No reports of GI problems such as nausea, vomiting, diarrhea, and constipation. He has no reports of blood in stools, dysuria and hematuria. His anxiety is moderate today. He denies suicidal ideations, homicidal ideations, or auditory hallucinations. He denies pain today.   Past Surgical History:  Procedure Laterality Date  . right nephrectomy      Family History  Problem Relation Age of Onset  . Diabetes Mother        non-insulin   . Diabetes Father        non-insulin   . Diabetes Sister         non-insulin     Social History   Socioeconomic History  . Marital status: Married    Spouse name: Not on file  . Number of children: Not on file  . Years of education: Not on file  . Highest education level: Not on file  Occupational History  . Not on file  Social Needs  . Financial resource strain: Not on file  . Food insecurity    Worry: Not on file    Inability: Not on file  . Transportation needs    Medical: Not on file    Non-medical: Not on file  Tobacco Use  . Smoking status: Never Smoker  . Smokeless tobacco: Never Used  Substance and Sexual Activity  . Alcohol use: No    Frequency: Never  . Drug use: No  . Sexual activity: Yes    Birth control/protection: Condom  Lifestyle  . Physical activity    Days per week: Not on file    Minutes per session: Not on file  . Stress: Not on file  Relationships  . Social Herbalist on phone: Not on file    Gets together: Not on file    Attends religious service: Not on file    Active member of club or organization: Not on file    Attends meetings of clubs or organizations: Not on file  Relationship status: Not on file  . Intimate partner violence    Fear of current or ex partner: Not on file    Emotionally abused: Not on file    Physically abused: Not on file    Forced sexual activity: Not on file  Other Topics Concern  . Not on file  Social History Narrative   Divorced.   Does exercise as much as possible.  Tries to go to gym at least 3 days a week.    Outpatient Medications Prior to Visit  Medication Sig Dispense Refill  . amoxicillin (AMOXIL) 500 MG tablet Take 1 tablet (500 mg total) by mouth 2 (two) times daily. 20 tablet 0  . atorvastatin (LIPITOR) 40 MG tablet Take 1 tablet (40 mg total) by mouth daily. 30 tablet 3  . clotrimazole-betamethasone (LOTRISONE) cream Apply 1 application topically 2 (two) times daily. 30 g 3  . gabapentin (NEURONTIN) 300 MG capsule Take 1 capsule (300 mg total) by  mouth 2 (two) times daily. 60 capsule 3  . glipiZIDE (GLUCOTROL) 10 MG tablet Take 1 tablet (10 mg total) by mouth 2 (two) times daily before a meal. 60 tablet 3  . hydrocortisone cream 0.5 % Apply 1 application topically 2 (two) times daily. (Patient not taking: Reported on 11/02/2018) 30 g 3  . ibuprofen (ADVIL) 800 MG tablet Take 1 tablet (800 mg total) by mouth every 8 (eight) hours as needed. 30 tablet 3  . losartan (COZAAR) 50 MG tablet Take 2 tablets (100 mg total) by mouth daily. 60 tablet 3  . methocarbamol (ROBAXIN) 750 MG tablet Take 1 tablet (750 mg total) by mouth 2 (two) times daily. 60 tablet 3  . metoprolol tartrate (LOPRESSOR) 25 MG tablet Take 1 tablet (25 mg total) by mouth 2 (two) times daily. 60 tablet 3  . tamsulosin (FLOMAX) 0.4 MG CAPS capsule Take 1 capsule (0.4 mg total) by mouth daily. 30 capsule 3   No facility-administered medications prior to visit.     No Known Allergies  ROS Review of Systems  Constitutional: Negative.   HENT: Negative.   Eyes: Negative.   Respiratory: Negative.   Cardiovascular: Negative.   Gastrointestinal: Negative.   Endocrine: Negative.   Genitourinary: Negative.   Musculoskeletal: Positive for arthralgias (generalized joint pain).  Skin: Negative.        Fungal toes; overgrown  Allergic/Immunologic: Negative.   Neurological: Negative.   Hematological: Negative.   Psychiatric/Behavioral: Negative.       Objective:    Physical Exam  Constitutional: He is oriented to person, place, and time. He appears well-developed and well-nourished.  HENT:  Head: Normocephalic and atraumatic.  Eyes: Conjunctivae are normal.  Neck: Normal range of motion. Neck supple.  Cardiovascular: Normal rate, regular rhythm, normal heart sounds and intact distal pulses.  Pulmonary/Chest: Effort normal and breath sounds normal.  Abdominal: Soft. Bowel sounds are normal. He exhibits distension.  Musculoskeletal: Normal range of motion.   Neurological: He is alert and oriented to person, place, and time. He has normal reflexes.  Skin: Skin is warm and dry.  Psychiatric: He has a normal mood and affect. His behavior is normal. Judgment and thought content normal.  Vitals reviewed.   BP 118/71   Pulse 88   Temp 98 F (36.7 C) (Oral)   Ht 5\' 11"  (1.803 m)   Wt 252 lb 4.8 oz (114.4 kg)   SpO2 98%   BMI 35.19 kg/m  Wt Readings from Last 3 Encounters:  02/02/19 252  lb 4.8 oz (114.4 kg)  11/02/18 252 lb 9.6 oz (114.6 kg)  09/30/18 251 lb (113.9 kg)     Health Maintenance Due  Topic Date Due  . FOOT EXAM  06/02/1967  . OPHTHALMOLOGY EXAM  06/02/1967  . COLONOSCOPY  06/02/2007  . INFLUENZA VACCINE  10/10/2018    There are no preventive care reminders to display for this patient.  Lab Results  Component Value Date   TSH 1.370 04/16/2017   Lab Results  Component Value Date   WBC 11.2 (H) 01/30/2018   HGB 14.0 01/30/2018   HCT 41.5 01/30/2018   MCV 84.0 01/30/2018   PLT 173 01/30/2018   Lab Results  Component Value Date   NA 137 01/30/2018   K 3.7 01/30/2018   CO2 25 01/30/2018   GLUCOSE 127 (H) 01/30/2018   BUN 27 (H) 01/30/2018   CREATININE 1.38 (H) 01/30/2018   BILITOT 0.6 01/30/2018   ALKPHOS 43 01/30/2018   AST 17 01/30/2018   ALT 47 (H) 01/30/2018   PROT 7.3 01/30/2018   ALBUMIN 3.8 01/30/2018   CALCIUM 9.1 01/30/2018   ANIONGAP 7 01/30/2018   Lab Results  Component Value Date   CHOL 206 (H) 04/16/2017   Lab Results  Component Value Date   HDL 60 04/16/2017   Lab Results  Component Value Date   LDLCALC 133 (H) 04/16/2017   Lab Results  Component Value Date   TRIG 66 04/16/2017   Lab Results  Component Value Date   CHOLHDL 3.4 04/16/2017   Lab Results  Component Value Date   HGBA1C 6.5 (A) 02/02/2019      Assessment & Plan:   1. Type 2 diabetes mellitus without complication, without long-term current use of insulin (HCC) Stable. He will continue medication as  prescribed, to decrease foods/beverages high in sugars and carbs and follow Heart Healthy or DASH diet. Increase physical activity to at least 30 minutes cardio exercise daily.  - POCT HgB A1C - Urinalysis Dipstick - Glucose (CBG)  2. Hemoglobin A1c less than 7.0% The current medical regimen is effective; Hgb A1c is stable at 6.5 today; continue present plan and medications as prescribed.   3. Proteinuria, unspecified type Monitor.   4. Chronic joint pain Pain injection administered in office today.  - ketorolac (TORADOL) injection 60 mg  5. Overgrown toenails - Ambulatory referral to Podiatry  6. Follow up He will follow up in 6 months.   Meds ordered this encounter  Medications  . ketorolac (TORADOL) injection 60 mg   Orders Placed This Encounter  Procedures  . Ambulatory referral to Podiatry  . POCT HgB A1C  . Urinalysis Dipstick  . Glucose (CBG)     Referral Orders     Ambulatory referral to [place],  MSN, FNP-BC [facility] [address] [place], [place] [address] [phone] fax  Problem List Items Addressed This Visit      Endocrine   Type 2 diabetes mellitus without complication, without long-term current use of insulin ([facility]) - Primary   Relevant Orders   POCT HgB A1C (Completed)   Urinalysis Dipstick (Completed)   Glucose (CBG) (Completed)      No orders of the defined types were placed in this encounter.   Follow-up: No follow-ups on file.    Azzie Glatter, FNP

## 2019-02-04 DIAGNOSIS — M2550 Pain in unspecified joint: Secondary | ICD-10-CM | POA: Insufficient documentation

## 2019-02-04 DIAGNOSIS — L602 Onychogryphosis: Secondary | ICD-10-CM | POA: Insufficient documentation

## 2019-02-04 DIAGNOSIS — R809 Proteinuria, unspecified: Secondary | ICD-10-CM | POA: Insufficient documentation

## 2019-02-04 DIAGNOSIS — G8929 Other chronic pain: Secondary | ICD-10-CM | POA: Insufficient documentation

## 2019-02-04 DIAGNOSIS — R7309 Other abnormal glucose: Secondary | ICD-10-CM | POA: Insufficient documentation

## 2019-02-22 MED FILL — LOSARTAN POTASSIUM 50 MG TA: 50 | 30 days supply | Qty: 60 | Fill #2

## 2019-02-22 MED FILL — GABAPENTIN 300 MG CAPSULE: 300 | 30 days supply | Qty: 60 | Fill #2

## 2019-02-22 MED FILL — ?GLIPIZIDE 10 MG TABLET: 10 | 30 days supply | Qty: 60 | Fill #2

## 2019-02-22 MED FILL — ?METOPROLOL 25 MG TABLET: 25 | 30 days supply | Qty: 60 | Fill #2

## 2019-04-26 MED FILL — ?METOPROLOL 25 MG TABLET: 25 | 30 days supply | Qty: 60 | Fill #3

## 2019-04-26 MED FILL — GABAPENTIN 300 MG CAPSULE: 300 | 30 days supply | Qty: 60 | Fill #3

## 2019-04-26 MED FILL — glipiZIDE 10 MG TABS: 10 | 30 days supply | Qty: 60 | Fill #3

## 2019-04-26 MED FILL — CLOTRIMAZOLE-BETAMETHASONE: 1-0.05 | 15 days supply | Qty: 30 | Fill #1

## 2019-04-26 MED FILL — LOSARTAN POTASSIUM 50 MG TA: 50 | 30 days supply | Qty: 60 | Fill #3

## 2019-04-27 MED FILL — ?IBUPROFEN 800 MG TABS: AMNEAL | 10 days supply | Qty: 30 | Fill #1

## 2019-06-16 ENCOUNTER — Ambulatory Visit: Payer: Self-pay

## 2019-06-23 ENCOUNTER — Other Ambulatory Visit: Payer: Self-pay | Admitting: Family Medicine

## 2019-06-23 DIAGNOSIS — G629 Polyneuropathy, unspecified: Secondary | ICD-10-CM

## 2019-06-23 DIAGNOSIS — I10 Essential (primary) hypertension: Secondary | ICD-10-CM

## 2019-06-23 DIAGNOSIS — E119 Type 2 diabetes mellitus without complications: Secondary | ICD-10-CM

## 2019-06-23 MED FILL — LOSARTAN POTASSIUM 50 MG TA: 50 | 30 days supply | Qty: 60 | Fill #0

## 2019-06-23 MED FILL — ?METOPROLOL TART 25MG TABLE: 25 | 30 days supply | Qty: 60 | Fill #0

## 2019-06-23 MED FILL — GABAPENTIN 300 MG CAPSULE: 300 | 30 days supply | Qty: 60 | Fill #0

## 2019-06-23 MED FILL — ?GLIPIZIDE 10 MG TABLET: 10 | 30 days supply | Qty: 60 | Fill #0

## 2019-07-04 ENCOUNTER — Emergency Department (HOSPITAL_COMMUNITY)
Admission: EM | Admit: 2019-07-04 | Discharge: 2019-07-04 | Disposition: A | Discharge status: Home / Self Care | Payer: Self-pay | Attending: Emergency Medicine | Admitting: Emergency Medicine

## 2019-07-04 DIAGNOSIS — Z5321 Procedure and treatment not carried out due to patient leaving prior to being seen by health care provider: Secondary | ICD-10-CM | POA: Insufficient documentation

## 2019-07-04 DIAGNOSIS — R1110 Vomiting, unspecified: Secondary | ICD-10-CM | POA: Insufficient documentation

## 2019-07-04 NOTE — ED Notes (Signed)
No response for triage. Per registration, pt seen leaving ED.

## 2019-07-06 MED FILL — ONDANSETRON ODT 4 MG TABLET: 4 | 10 days supply | Qty: 30 | Fill #0

## 2019-07-06 MED FILL — ?PANTOPRAZOLE SO DR 40MG TA: 40 | 30 days supply | Qty: 30 | Fill #0

## 2019-07-20 ENCOUNTER — Other Ambulatory Visit: Payer: Self-pay

## 2019-07-20 ENCOUNTER — Ambulatory Visit (INDEPENDENT_AMBULATORY_CARE_PROVIDER_SITE_OTHER): Payer: Self-pay | Admitting: Family Medicine

## 2019-07-20 ENCOUNTER — Encounter: Payer: Self-pay | Admitting: Family Medicine

## 2019-07-20 VITALS — BP 119/71 | HR 76 | Temp 98.4°F | Ht 71.0 in | Wt 248.2 lb

## 2019-07-20 DIAGNOSIS — C641 Malignant neoplasm of right kidney, except renal pelvis: Secondary | ICD-10-CM

## 2019-07-20 DIAGNOSIS — M2550 Pain in unspecified joint: Secondary | ICD-10-CM

## 2019-07-20 DIAGNOSIS — G8929 Other chronic pain: Secondary | ICD-10-CM

## 2019-07-20 DIAGNOSIS — N2889 Other specified disorders of kidney and ureter: Secondary | ICD-10-CM | POA: Insufficient documentation

## 2019-07-20 DIAGNOSIS — Z0000 Encounter for general adult medical examination without abnormal findings: Secondary | ICD-10-CM

## 2019-07-20 DIAGNOSIS — R7303 Prediabetes: Secondary | ICD-10-CM

## 2019-07-20 DIAGNOSIS — Z09 Encounter for follow-up examination after completed treatment for conditions other than malignant neoplasm: Secondary | ICD-10-CM

## 2019-07-20 DIAGNOSIS — R8290 Unspecified abnormal findings in urine: Secondary | ICD-10-CM

## 2019-07-20 LAB — POCT URINALYSIS DIPSTICK
Bilirubin, UA: NEGATIVE
Glucose, UA: NEGATIVE
Ketones, UA: NEGATIVE
Nitrite, UA: NEGATIVE
Protein, UA: POSITIVE — AB
Spec Grav, UA: 1.02 (ref 1.010–1.025)
Urobilinogen, UA: 0.2 E.U./dL
pH, UA: 6 (ref 5.0–8.0)

## 2019-07-20 LAB — POCT GLYCOSYLATED HEMOGLOBIN (HGB A1C): Hemoglobin A1C: 6.6 % — AB (ref 4.0–5.6)

## 2019-07-20 LAB — GLUCOSE, POCT (MANUAL RESULT ENTRY): POC Glucose: 140 mg/dl — AB (ref 70–99)

## 2019-07-20 MED ORDER — ACETAMINOPHEN 500 MG PO TABS: 500.0000 mg | ORAL_TABLET | Freq: Two times a day (BID) | ORAL | 5 refills | Status: DC

## 2019-07-20 NOTE — Progress Notes (Signed)
Patient [facility]   Established Patient Office Visit  Subjective:  Patient ID: Jon Harper, male    DOB: 11/04/1955  Age: 64 y.o. MRN: [id number]  CC:  Chief Complaint  Patient presents with  . Follow-up    HTN, DM  . Foot Swelling  . Hospitalization Follow-up    ED 07/05/19 Nausea & vomiting    HPI Jon Harper is a 64 is a male who presents for Follow Up today.   Past Medical History:  Diagnosis Date  . Chronic low back pain   . Diabetes mellitus   . Hypertension   . Neck pain   . Neck pain   . Skin rash 10/2018  . Tooth abscess 10/2018   Current Status: Since his last office visit, he is doing well with no complaints. He has c/o of right flank pain, nausea and vomiting X 1 week now. He denies fevers, chills, fatigue, recent infections, weight loss, and night sweats. He has not had any headaches, visual changes, dizziness, and falls. No chest pain, heart palpitations, cough and shortness of breath reported. Denies GI problems such as nausea, vomiting, diarrhea, and constipation. He has no reports of blood in stools, dysuria and hematuria. No depression or anxiety, and denies suicidal ideations, homicidal ideations, or auditory hallucinations. He is taking all medications as prescribed. She denies pain today.   Past Surgical History:  Procedure Laterality Date  . right nephrectomy      Family History  Problem Relation Age of Onset  . Diabetes Mother        non-insulin   . Diabetes Father        non-insulin   . Diabetes Sister        non-insulin     Social History   Socioeconomic History  . Marital status: Married    Spouse name: Not on file  . Number of children: Not on file  . Years of education: Not on file  . Highest education level: Not on file  Occupational History  . Not on file  Tobacco Use  . Smoking status: Never Smoker  . Smokeless tobacco: Never Used  Substance and Sexual Activity  . Alcohol use:  No  . Drug use: No  . Sexual activity: Yes    Birth control/protection: Condom  Other Topics Concern  . Not on file  Social History Narrative   Divorced.   Does exercise as much as possible.  Tries to go to gym at least 3 days a week.   Social Determinants of Health   Financial Resource Strain:   . Difficulty of Paying Living Expenses:   Food Insecurity:   . Worried About Charity fundraiser in the Last Year:   . Arboriculturist in the Last Year:   Transportation Needs:   . Film/video editor (Medical):   Marland Kitchen Lack of Transportation (Non-Medical):   Physical Activity:   . Days of Exercise per Week:   . Minutes of Exercise per Session:   Stress:   . Feeling of Stress :   Social Connections:   . Frequency of Communication with Friends and Family:   . Frequency of Social Gatherings with Friends and Family:   . Attends Religious Services:   . Active Member of Clubs or Organizations:   . Attends Archivist Meetings:   Marland Kitchen Marital Status:   Intimate Partner Violence:   . Fear of Current or Ex-Partner:   .  Emotionally Abused:   Marland Kitchen Physically Abused:   . Sexually Abused:     Outpatient Medications Prior to Visit  Medication Sig Dispense Refill  . atorvastatin (LIPITOR) 40 MG tablet Take 1 tablet (40 mg total) by mouth daily. 30 tablet 3  . clotrimazole-betamethasone (LOTRISONE) cream Apply 1 application topically 2 (two) times daily. 30 g 3  . gabapentin (NEURONTIN) 300 MG capsule TAKE 1 CAPSULE (300 MG TOTAL) BY MOUTH 2 (TWO) TIMES DAILY. 60 capsule 3  . glipiZIDE (GLUCOTROL) 10 MG tablet TAKE 1 TABLET (10 MG TOTAL) BY MOUTH 2 (TWO) TIMES DAILY BEFORE A MEAL. 60 tablet 3  . hydrocortisone cream 0.5 % Apply 1 application topically 2 (two) times daily. 30 g 3  . methocarbamol (ROBAXIN) 750 MG tablet Take 1 tablet (750 mg total) by mouth 2 (two) times daily. 60 tablet 3  . metoprolol tartrate (LOPRESSOR) 25 MG tablet TAKE 1 TABLET (25 MG TOTAL) BY MOUTH 2 (TWO) TIMES  DAILY. 60 tablet 3  . tamsulosin (FLOMAX) 0.4 MG CAPS capsule Take 1 capsule (0.4 mg total) by mouth daily. 30 capsule 3  . losartan (COZAAR) 50 MG tablet TAKE 2 TABLETS (100 MG TOTAL) BY MOUTH DAILY. (Patient not taking: Reported on 07/20/2019) 60 tablet 3  . amoxicillin (AMOXIL) 500 MG tablet Take 1 tablet (500 mg total) by mouth 2 (two) times daily. 20 tablet 0  . ibuprofen (ADVIL) 800 MG tablet Take 1 tablet (800 mg total) by mouth every 8 (eight) hours as needed. (Patient not taking: Reported on 07/20/2019) 30 tablet 3   No facility-administered medications prior to visit.    No Known Allergies  ROS Review of Systems  Constitutional: Negative.   HENT: Negative.   Eyes: Negative.   Respiratory: Negative.   Cardiovascular: Negative.   Gastrointestinal: Positive for abdominal distention.  Endocrine: Negative.   Genitourinary: Negative.   Musculoskeletal: Negative.   Skin: Negative.   Neurological: Negative.   Hematological: Negative.   Psychiatric/Behavioral: The patient is nervous/anxious.       Objective:    Physical Exam  Constitutional: He is oriented to person, place, and time. He appears well-developed and well-nourished.  HENT:  Head: Normocephalic and atraumatic.  Eyes: Conjunctivae are normal.  Cardiovascular: Normal rate, regular rhythm, normal heart sounds and intact distal pulses.  Pulmonary/Chest: Effort normal and breath sounds normal.  Abdominal: Soft. Bowel sounds are normal. There is abdominal tenderness. There is guarding (right flank area).  Musculoskeletal:        General: Normal range of motion.     Cervical back: Normal range of motion and neck supple.  Neurological: He is alert and oriented to person, place, and time. He has normal reflexes.  Skin: Skin is warm and dry.  Psychiatric: He has a normal mood and affect. His behavior is normal. Judgment and thought content normal.  Nursing note and vitals reviewed.   BP 119/71   Pulse 76   Temp 98.4  F (36.9 C)   Ht 5\' 11"  (1.803 m)   Wt 248 lb 3.2 oz (112.6 kg)   SpO2 99%   BMI 34.62 kg/m  Wt Readings from Last 3 Encounters:  07/20/19 248 lb 3.2 oz (112.6 kg)  02/02/19 252 lb 4.8 oz (114.4 kg)  11/02/18 252 lb 9.6 oz (114.6 kg)     Health Maintenance Due  Topic Date Due  . FOOT EXAM  Never done  . OPHTHALMOLOGY EXAM  Never done  . COVID-19 Vaccine (1) Never done  .  COLONOSCOPY  Never done    There are no preventive care reminders to display for this patient.  Lab Results  Component Value Date   TSH 1.370 04/16/2017   Lab Results  Component Value Date   WBC 11.2 (H) 01/30/2018   HGB 14.0 01/30/2018   HCT 41.5 01/30/2018   MCV 84.0 01/30/2018   PLT 173 01/30/2018   Lab Results  Component Value Date   NA 137 01/30/2018   K 3.7 01/30/2018   CO2 25 01/30/2018   GLUCOSE 127 (H) 01/30/2018   BUN 27 (H) 01/30/2018   CREATININE 1.38 (H) 01/30/2018   BILITOT 0.6 01/30/2018   ALKPHOS 43 01/30/2018   AST 17 01/30/2018   ALT 47 (H) 01/30/2018   PROT 7.3 01/30/2018   ALBUMIN 3.8 01/30/2018   CALCIUM 9.1 01/30/2018   ANIONGAP 7 01/30/2018   Lab Results  Component Value Date   CHOL 206 (H) 04/16/2017   Lab Results  Component Value Date   HDL 60 04/16/2017   Lab Results  Component Value Date   LDLCALC 133 (H) 04/16/2017   Lab Results  Component Value Date   TRIG 66 04/16/2017   Lab Results  Component Value Date   CHOLHDL 3.4 04/16/2017   Lab Results  Component Value Date   HGBA1C 6.6 (A) 07/20/2019      Assessment & Plan:   1. Renal cell carcinoma of right kidney [facility]) - Ambulatory referral to Nephrology  2. Renal mass, right  3. Chronic joint pain - acetaminophen (TYLENOL) 500 MG tablet; Take 1 tablet (500 mg total) by mouth in the morning and at bedtime.  Dispense: 60 tablet; Refill: 5  4. Prediabetes  5. Abnormal urinalysis Results are pending.  - Urine Culture  6. Healthcare maintenance - POCT glycosylated hemoglobin (Hb A1C) -  POCT urinalysis dipstick - POCT glucose (manual entry)  7. Follow up He will follow up in 6 months.   Meds ordered this encounter  Medications  . acetaminophen (TYLENOL) 500 MG tablet    Sig: Take 1 tablet (500 mg total) by mouth in the morning and at bedtime.    Dispense:  60 tablet    Refill:  5    Orders Placed This Encounter  Procedures  . Urine Culture  . Ambulatory referral to Nephrology  . POCT glycosylated hemoglobin (Hb A1C)  . POCT urinalysis dipstick  . POCT glucose (manual entry)     Referral Orders     Ambulatory referral to Nephrology   Kathe Becton,  MSN, FNP-BC [address], [place] [address] [phone]- fax   Problem List Items Addressed This Visit      Other   Chronic joint pain   Relevant Medications   acetaminophen (TYLENOL) 500 MG tablet   Prediabetes    Other Visit Diagnoses    Renal cell carcinoma of right kidney ([place])    -  Primary   Relevant Orders   Ambulatory referral to Nephrology   Renal mass, right       Abnormal urinalysis       Relevant Orders   Urine Culture   Healthcare maintenance       Relevant Orders   POCT glycosylated hemoglobin (Hb A1C) (Completed)   POCT urinalysis dipstick (Completed)   POCT glucose (manual entry) (Completed)   Follow up          Meds ordered this encounter  Medications  .  acetaminophen (TYLENOL) 500 MG tablet    Sig: Take 1 tablet (500 mg total) by mouth in the morning and at bedtime.    Dispense:  60 tablet    Refill:  5    Follow-up: Return in about 6 months (around 01/20/2020).    Azzie Glatter, FNP

## 2019-07-22 ENCOUNTER — Ambulatory Visit: Payer: Self-pay | Admitting: Nurse Practitioner

## 2019-08-02 ENCOUNTER — Ambulatory Visit: Payer: Self-pay | Admitting: Family Medicine

## 2019-08-10 MED FILL — METOPROLOL TARTRATE 25 MG T: 25 | 30 days supply | Qty: 60 | Fill #1

## 2019-08-10 MED FILL — GABAPENTIN 300 MG CAPSULE: 300 | 30 days supply | Qty: 60 | Fill #1

## 2019-08-10 MED FILL — glipiZIDE 10 MG TABS: 10 | 30 days supply | Qty: 60 | Fill #1

## 2019-08-10 MED FILL — LOSARTAN POTASSIUM 50 MG TA: 50 | 30 days supply | Qty: 60 | Fill #1

## 2019-08-23 MED FILL — LOSARTAN POTASSIUM 50 MG TA: 50 | 30 days supply | Qty: 60 | Fill #1

## 2019-08-23 MED FILL — METOPROLOL TARTRATE 25 MG T: 25 | 30 days supply | Qty: 60 | Fill #1

## 2019-08-23 MED FILL — GABAPENTIN 300 MG CAPSULE: 300 | 30 days supply | Qty: 60 | Fill #1

## 2019-08-23 MED FILL — glipiZIDE 10 MG TABS: 10 | 30 days supply | Qty: 60 | Fill #1

## 2019-08-31 ENCOUNTER — Other Ambulatory Visit: Payer: Self-pay

## 2019-08-31 ENCOUNTER — Ambulatory Visit: Payer: Self-pay | Attending: Family Medicine

## 2019-09-14 ENCOUNTER — Telehealth: Payer: Self-pay | Admitting: Family Medicine

## 2019-09-14 NOTE — Telephone Encounter (Signed)
I called the Pt and inform him still missing the non filling letter for 2020 and the FS letter

## 2019-09-14 NOTE — Telephone Encounter (Signed)
I call the Pt to inform him that I received the FS letter fax and the 2017 taxes fax, the 2017 taxes form I CA NOT used it need to be the 2020 taxes or the 2020 Non Filling letter from the IRS, LVM to call me back

## 2019-09-14 NOTE — Telephone Encounter (Signed)
requested for a call

## 2019-09-14 NOTE — Telephone Encounter (Signed)
Pt has called back requesting that Jon Harper give him a call back as he missed call. # [phone]

## 2019-09-14 NOTE — Telephone Encounter (Signed)
Pt was sent a letter from financial dept. Inform them, that the application they submitted was incomplete, since they were missing some documentation at the time of the appointment, Pt need to reschedule and resubmit all new papers and application for [employer] and [place], P.S. old documents has been sent back by mail to the Pt and Pt. need to make a new appt. 

## 2019-09-15 ENCOUNTER — Telehealth: Payer: Self-pay | Admitting: Family Medicine

## 2019-09-15 NOTE — Telephone Encounter (Signed)
Pt was explain that is necessary that we have a letter from IRS saying that he did not file 2020 or the full taxes for 2020, Pt understood, also he has inform if I don't get this document by the 09/20/19 I will return all his documents and he need to reschedule a new appt with financial

## 2019-09-15 NOTE — Telephone Encounter (Signed)
Please advise 

## 2019-09-21 ENCOUNTER — Telehealth: Payer: Self-pay | Admitting: Family Medicine

## 2019-09-21 NOTE — Telephone Encounter (Signed)
Pt was sent a letter from financial dept. Inform them, that the application they submitted was incomplete, since they were missing some documentation at the time of the appointment, Pt need to reschedule and resubmit all new papers and application for [employer] and [place], P.S. old documents has been sent back by mail to the Pt and Pt. need to make a new appt. 

## 2019-10-07 MED FILL — glipiZIDE 10 MG TABS: 10 | 30 days supply | Qty: 60 | Fill #2

## 2019-10-07 MED FILL — LOSARTAN POTASSIUM 50 MG TA: 50 | 30 days supply | Qty: 60 | Fill #2

## 2019-10-07 MED FILL — GABAPENTIN 300 MG CAPSULE: 300 | 30 days supply | Qty: 60 | Fill #2

## 2019-10-07 MED FILL — METOPROLOL TARTRATE 25 MG T: 25 | 30 days supply | Qty: 60 | Fill #2

## 2019-12-02 MED FILL — LOSARTAN POTASSIUM 50 MG TA: 50 | 30 days supply | Qty: 60 | Fill #3

## 2019-12-02 MED FILL — GABAPENTIN 300 MG CAPSULE: 300 | 30 days supply | Qty: 60 | Fill #3

## 2019-12-02 MED FILL — glipiZIDE 10 MG TABS: 10 | 30 days supply | Qty: 60 | Fill #3

## 2019-12-02 MED FILL — METOPROLOL TARTRATE 25 MG T: 25 | 30 days supply | Qty: 60 | Fill #3

## 2019-12-17 ENCOUNTER — Encounter (HOSPITAL_COMMUNITY): Payer: Self-pay | Admitting: Emergency Medicine

## 2019-12-17 ENCOUNTER — Emergency Department (HOSPITAL_COMMUNITY)
Admission: EM | Admit: 2019-12-17 | Discharge: 2019-12-18 | Disposition: A | Discharge status: Home / Self Care | Payer: BLUE CROSS/BLUE SHIELD | Attending: Emergency Medicine | Admitting: Emergency Medicine

## 2019-12-17 ENCOUNTER — Emergency Department (HOSPITAL_COMMUNITY): Payer: BLUE CROSS/BLUE SHIELD

## 2019-12-17 ENCOUNTER — Other Ambulatory Visit: Payer: Self-pay

## 2019-12-17 DIAGNOSIS — I129 Hypertensive chronic kidney disease with stage 1 through stage 4 chronic kidney disease, or unspecified chronic kidney disease: Secondary | ICD-10-CM | POA: Diagnosis not present

## 2019-12-17 DIAGNOSIS — R591 Generalized enlarged lymph nodes: Secondary | ICD-10-CM

## 2019-12-17 DIAGNOSIS — Z7984 Long term (current) use of oral hypoglycemic drugs: Secondary | ICD-10-CM | POA: Diagnosis not present

## 2019-12-17 DIAGNOSIS — R590 Localized enlarged lymph nodes: Secondary | ICD-10-CM | POA: Insufficient documentation

## 2019-12-17 DIAGNOSIS — K5732 Diverticulitis of large intestine without perforation or abscess without bleeding: Secondary | ICD-10-CM | POA: Insufficient documentation

## 2019-12-17 DIAGNOSIS — Z85528 Personal history of other malignant neoplasm of kidney: Secondary | ICD-10-CM | POA: Insufficient documentation

## 2019-12-17 DIAGNOSIS — N183 Chronic kidney disease, stage 3 unspecified: Secondary | ICD-10-CM | POA: Diagnosis not present

## 2019-12-17 DIAGNOSIS — E1122 Type 2 diabetes mellitus with diabetic chronic kidney disease: Secondary | ICD-10-CM | POA: Insufficient documentation

## 2019-12-17 DIAGNOSIS — Z79899 Other long term (current) drug therapy: Secondary | ICD-10-CM | POA: Diagnosis not present

## 2019-12-17 DIAGNOSIS — R1084 Generalized abdominal pain: Secondary | ICD-10-CM | POA: Diagnosis present

## 2019-12-17 DIAGNOSIS — K5792 Diverticulitis of intestine, part unspecified, without perforation or abscess without bleeding: Secondary | ICD-10-CM

## 2019-12-17 LAB — URINALYSIS, ROUTINE W REFLEX MICROSCOPIC
Bacteria, UA: NONE SEEN
Bilirubin Urine: NEGATIVE
Glucose, UA: NEGATIVE mg/dL
Hgb urine dipstick: NEGATIVE
Ketones, ur: NEGATIVE mg/dL
Leukocytes,Ua: NEGATIVE
Nitrite: NEGATIVE
Protein, ur: 100 mg/dL — AB
Specific Gravity, Urine: 1.012 (ref 1.005–1.030)
pH: 6 (ref 5.0–8.0)

## 2019-12-17 LAB — CBC
HCT: 44.4 % (ref 39.0–52.0)
Hemoglobin: 14.7 g/dL (ref 13.0–17.0)
MCH: 27.7 pg (ref 26.0–34.0)
MCHC: 33.1 g/dL (ref 30.0–36.0)
MCV: 83.8 fL (ref 80.0–100.0)
Platelets: 202 10*3/uL (ref 150–400)
RBC: 5.3 MIL/uL (ref 4.22–5.81)
RDW: 13.9 % (ref 11.5–15.5)
WBC: 11.8 10*3/uL — ABNORMAL HIGH (ref 4.0–10.5)
nRBC: 0 % (ref 0.0–0.2)

## 2019-12-17 LAB — COMPREHENSIVE METABOLIC PANEL
ALT: 30 U/L (ref 0–44)
AST: 18 U/L (ref 15–41)
Albumin: 4.4 g/dL (ref 3.5–5.0)
Alkaline Phosphatase: 50 U/L (ref 38–126)
Anion gap: 9 (ref 5–15)
BUN: 23 mg/dL (ref 8–23)
CO2: 27 mmol/L (ref 22–32)
Calcium: 9.9 mg/dL (ref 8.9–10.3)
Chloride: 103 mmol/L (ref 98–111)
Creatinine, Ser: 1.59 mg/dL — ABNORMAL HIGH (ref 0.61–1.24)
GFR, Estimated: 46 mL/min — ABNORMAL LOW (ref >60)
Glucose, Bld: 116 mg/dL — ABNORMAL HIGH (ref 70–99)
Potassium: 4.3 mmol/L (ref 3.5–5.1)
Sodium: 139 mmol/L (ref 135–145)
Total Bilirubin: 0.8 mg/dL (ref 0.3–1.2)
Total Protein: 8.5 g/dL — ABNORMAL HIGH (ref 6.5–8.1)

## 2019-12-17 LAB — LIPASE, BLOOD: Lipase: 46 U/L (ref 11–51)

## 2019-12-17 MED ORDER — MORPHINE SULFATE (PF) 4 MG/ML IV SOLN
4.0000 mg | Freq: Once | INTRAVENOUS | Status: AC
  Administered 2019-12-17: 4 mg via INTRAVENOUS
  Filled 2019-12-17: qty 1

## 2019-12-17 MED ORDER — METRONIDAZOLE IN NACL 5-0.79 MG/ML-% IV SOLN
500.0000 mg | Freq: Once | INTRAVENOUS | Status: AC
  Administered 2019-12-17: 500 mg via INTRAVENOUS
  Filled 2019-12-17: qty 100

## 2019-12-17 MED ORDER — ONDANSETRON HCL 4 MG/2ML IJ SOLN
4.0000 mg | Freq: Once | INTRAMUSCULAR | Status: AC
  Administered 2019-12-17: 4 mg via INTRAVENOUS
  Filled 2019-12-17: qty 2

## 2019-12-17 MED ORDER — CIPROFLOXACIN IN D5W 400 MG/200ML IV SOLN
400.0000 mg | Freq: Once | INTRAVENOUS | Status: AC
  Administered 2019-12-17: 400 mg via INTRAVENOUS
  Filled 2019-12-17: qty 200

## 2019-12-17 MED ORDER — AMOXICILLIN-POT CLAVULANATE 875-125 MG PO TABS: 1.0000 | ORAL_TABLET | Freq: Two times a day (BID) | ORAL | 0 refills | Status: AC

## 2019-12-17 MED ORDER — ONDANSETRON 4 MG PO TBDP: 4.0000 mg | ORAL_TABLET | Freq: Three times a day (TID) | ORAL | 0 refills | Status: DC | PRN

## 2019-12-17 MED ORDER — HYDROCODONE-ACETAMINOPHEN 5-325 MG PO TABS: 1.0000 | ORAL_TABLET | ORAL | 0 refills | Status: DC | PRN

## 2019-12-17 NOTE — ED Triage Notes (Signed)
Per pt, states abdominal pain and diarrhea for 2 days-also increased strain when urinating-

## 2019-12-17 NOTE — Discharge Instructions (Addendum)
As we discussed, your CT scan shows evidence of diverticulitis which is likely causing your pain and your symptoms. You also have some lymphadenopathy on your CT scan.  This is likely reactive from the infection.  After you complete treatment, you need to follow-up with your primary care doctor to ensure that this is improved.  You may need a screening colonoscopy to ensure that this gets better.  I provided an outpatient referral to GI.  Take antibiotics as directed.  Take pain medications as directed.  Return the emergency department for any worsening pain, fever, persistent vomiting or any other worsening or concerning symptoms.

## 2019-12-17 NOTE — ED Provider Notes (Signed)
[facility] DEPT Provider Note   CSN: [id number] Arrival date & time: 12/17/19  1850     History Chief Complaint  Patient presents with  . Abdominal Pain    Jon Harper is a 64 y.o. male possible history of hypertension, diabetes, renal carcinoma who presents for evaluation of abdominal pain x2 days.  He reports that pain started initially about 2 days ago.  He states it is generalized but does sometimes get worse in the left lower quadrant.  He states that the pain has always been there but varies in intensity.  He states sometimes it eases off but once he starts moving around again, he will start to have the pain.  He states it is mostly a cramping type pain.  He is not a nausea or vomiting.  He has had some diarrhea.  He states that there has been some " white gel" in the stools.  He has not noted any blood or melena.  He states he has not any fever.  He states that he has not tried any medications for the pain.  He states that he also started noticing that he feels that he has to strain a lot to get his urine out.  He is not having dysuria or hematuria.  He does have a history of renal carcinoma and has has had right kidney removed.  He denies any fevers, chest pain, difficulty breathing, cough, testicular pain or swelling, penile swelling.  He states that a week ago, he inserted a dildo into his rectum but was able to remove it.  He has not inserted anything else into the rectum.  The history is provided by the patient.       Past Medical History:  Diagnosis Date  . Chronic low back pain   . Diabetes mellitus   . Hypertension   . Neck pain   . Neck pain   . Skin rash 10/2018  . Tooth abscess 10/2018    Patient Active Problem List   Diagnosis Date Noted  . Renal mass, right 07/20/2019  . Hemoglobin A1c less than 7.0% 02/04/2019  . Proteinuria 02/04/2019  . Chronic joint pain 02/04/2019  . Overgrown toenails 02/04/2019  . Tooth abscess  11/03/2018  . Tooth pain 11/03/2018  . Skin rash 11/03/2018  . Chronic bilateral low back pain with bilateral sciatica 09/30/2018  . Prediabetes 09/30/2018  . PAC (premature atrial contraction) 07/07/2017  . Type 2 diabetes mellitus without complication, without long-term current use of insulin ([place]) 04/16/2017  . Abnormal EKG 04/16/2017  . Essential hypertension 04/16/2017  . Hyperlipidemia associated with type 2 diabetes mellitus ([place]) 04/16/2017  . Hx of renal cell cancer 04/16/2017  . Stage 3 chronic kidney disease ([place]) 04/16/2017  . History of unilateral nephrectomy 04/16/2017    Past Surgical History:  Procedure Laterality Date  . right nephrectomy         Family History  Problem Relation Age of Onset  . Diabetes Mother        non-insulin   . Diabetes Father        non-insulin   . Diabetes Sister        non-insulin     Social History   Tobacco Use  . Smoking status: Never Smoker  . Smokeless tobacco: Never Used  Vaping Use  . Vaping Use: Never used  Substance Use Topics  . Alcohol use: No  . Drug use: No    Home Medications Prior to Admission  medications   Medication Sig Start Date End Date Taking? Authorizing Provider  acetaminophen (TYLENOL) 500 MG tablet Take 1 tablet (500 mg total) by mouth in the morning and at bedtime. 07/20/19   Azzie Glatter, FNP  amoxicillin-clavulanate (AUGMENTIN) 875-125 MG tablet Take 1 tablet by mouth 2 (two) times daily for 7 days. 12/17/19 12/24/19  Volanda Napoleon, PA-C  atorvastatin (LIPITOR) 40 MG tablet Take 1 tablet (40 mg total) by mouth daily. 10/26/18   Azzie Glatter, FNP  clotrimazole-betamethasone (LOTRISONE) cream Apply 1 application topically 2 (two) times daily. 11/02/18   Azzie Glatter, FNP  gabapentin (NEURONTIN) 300 MG capsule TAKE 1 CAPSULE (300 MG TOTAL) BY MOUTH 2 (TWO) TIMES DAILY. 06/23/19   Azzie Glatter, FNP  glipiZIDE (GLUCOTROL) 10 MG tablet TAKE 1 TABLET (10 MG TOTAL) BY MOUTH 2 (TWO)  TIMES DAILY BEFORE A MEAL. 06/23/19   Azzie Glatter, FNP  HYDROcodone-acetaminophen (NORCO/VICODIN) 5-325 MG tablet Take 1-2 tablets by mouth every 4 (four) hours as needed. 12/17/19   Volanda Napoleon, PA-C  hydrocortisone cream 0.5 % Apply 1 application topically 2 (two) times daily. 10/26/18   Azzie Glatter, FNP  losartan (COZAAR) 50 MG tablet TAKE 2 TABLETS (100 MG TOTAL) BY MOUTH DAILY. Patient not taking: Reported on 07/20/2019 06/23/19   Azzie Glatter, FNP  methocarbamol (ROBAXIN) 750 MG tablet Take 1 tablet (750 mg total) by mouth 2 (two) times daily. 10/26/18   Azzie Glatter, FNP  metoprolol tartrate (LOPRESSOR) 25 MG tablet TAKE 1 TABLET (25 MG TOTAL) BY MOUTH 2 (TWO) TIMES DAILY. 06/23/19   Azzie Glatter, FNP  ondansetron (ZOFRAN ODT) 4 MG disintegrating tablet Take 1 tablet (4 mg total) by mouth every 8 (eight) hours as needed for nausea or vomiting. 12/17/19   Volanda Napoleon, PA-C  tamsulosin (FLOMAX) 0.4 MG CAPS capsule Take 1 capsule (0.4 mg total) by mouth daily. 10/26/18   Azzie Glatter, FNP    Allergies    Patient has no known allergies.  Review of Systems   Review of Systems  Constitutional: Negative for fever.  Respiratory: Negative for cough and shortness of breath.   Cardiovascular: Negative for chest pain.  Gastrointestinal: Positive for abdominal pain and diarrhea. Negative for blood in stool, nausea and vomiting.  Genitourinary: Positive for difficulty urinating. Negative for discharge, dysuria, hematuria, penile pain, penile swelling and scrotal swelling.  Neurological: Negative for headaches.  All other systems reviewed and are negative.   Physical Exam Updated Vital Signs BP (!) 141/91 (BP Location: Right Arm)   Pulse 79   Temp 98.5 F (36.9 C) (Oral)   Resp 18   SpO2 97%   Physical Exam Vitals and nursing note reviewed. Exam conducted with a chaperone present.  Constitutional:      Appearance: Normal appearance. He is  well-developed.     Comments: Sitting comfortably on examination table  HENT:     Head: Normocephalic and atraumatic.  Eyes:     General: Lids are normal.     Conjunctiva/sclera: Conjunctivae normal.     Pupils: Pupils are equal, round, and reactive to light.  Cardiovascular:     Rate and Rhythm: Normal rate and regular rhythm.     Pulses: Normal pulses.     Heart sounds: Normal heart sounds. No murmur heard.  No friction rub. No gallop.   Pulmonary:     Effort: Pulmonary effort is normal.     Breath sounds: Normal breath sounds.  Comments: Lungs clear to auscultation bilaterally.  Symmetric chest rise.  No wheezing, rales, rhonchi. Abdominal:     Palpations: Abdomen is soft. Abdomen is not rigid.     Tenderness: There is abdominal tenderness in the left lower quadrant. There is no guarding.     Comments: Abdomen is soft, nondistended.  Tenderness palpation noted diffusely but is slightly worse in the left lower quadrant.  No rigidity, guarding.  No CVA tenderness.  Genitourinary:    Comments: The exam was performed with a chaperone present Yetta Flock, RN). Normal male genitalia. No evidence of rash, ulcers or lesions. Digital Rectal Exam reveals sphincter with good tone. No external hemorrhoids. No masses or fissures. Stool color is brown with no overt blood. Musculoskeletal:        General: Normal range of motion.     Cervical back: Full passive range of motion without pain.  Skin:    General: Skin is warm and dry.     Capillary Refill: Capillary refill takes less than 2 seconds.  Neurological:     Mental Status: He is alert and oriented to person, place, and time.  Psychiatric:        Speech: Speech normal.     ED Results / Procedures / Treatments   Labs (all labs ordered are listed, but only abnormal results are displayed) Labs Reviewed  COMPREHENSIVE METABOLIC PANEL - Abnormal; Notable for the following components:      Result Value   Glucose, Bld 116 (*)     Creatinine, Ser 1.59 (*)    Total Protein 8.5 (*)    GFR, Estimated 46 (*)    All other components within normal limits  CBC - Abnormal; Notable for the following components:   WBC 11.8 (*)    All other components within normal limits  URINALYSIS, ROUTINE W REFLEX MICROSCOPIC - Abnormal; Notable for the following components:   Protein, ur 100 (*)    All other components within normal limits  LIPASE, BLOOD    EKG None  Radiology CT ABDOMEN PELVIS WO CONTRAST  Result Date: 12/17/2019 CLINICAL DATA:  Abdominal pain and diarrhea for 2 days. EXAM: CT ABDOMEN AND PELVIS WITHOUT CONTRAST TECHNIQUE: Multidetector CT imaging of the abdomen and pelvis was performed following the standard protocol without IV contrast. COMPARISON:  CT dated January 30, 2018 FINDINGS: Lower chest: There is chronic scarring versus atelectasis at the right lung base.The heart size is normal. Hepatobiliary: The liver is normal. Cholelithiasis without acute inflammation.There is no biliary ductal dilation. Pancreas: Normal contours without ductal dilatation. No peripancreatic fluid collection. Spleen: Unremarkable. Adrenals/Urinary Tract: --Adrenal glands: The right adrenal gland appears to be surgically absent. --Right kidney/ureter: The right kidney appears to be surgically absent. --Left kidney/ureter: There are multiple cysts involving the left kidney, relatively stable from prior study. There is no left-sided hydronephrosis. There may be a punctate nonobstructing stone in the interpolar region. --Urinary bladder: Unremarkable. Stomach/Bowel: --Stomach/Duodenum: No hiatal hernia or other gastric abnormality. Normal duodenal course and caliber. --Small bowel: Unremarkable. --Colon: There is sigmoid diverticulosis with CT evidence for acute uncomplicated sigmoid diverticulitis. There is no abscess or extraluminal air. --Appendix: Surgically absent. Vascular/Lymphatic: Atherosclerotic calcification is present within the  non-aneurysmal abdominal aorta, without hemodynamically significant stenosis. --No retroperitoneal lymphadenopathy. --there are few enlarged lymph nodes in the left lower quadrant, likely along the IMV or superior rectal chain. --No pelvic or inguinal lymphadenopathy. Reproductive: Unremarkable Other: There is a trace amount of free fluid in the patient's pelvis, likely reactive. The  abdominal wall is normal. Musculoskeletal. No acute displaced fractures. IMPRESSION: 1. Acute uncomplicated sigmoid diverticulitis. There are multiple adjacent mildly enlarged regional lymph nodes, presumably reactive. However, outpatient age-appropriate colon cancer screening is recommended following treatment. 2. Cholelithiasis without acute inflammation. Aortic Atherosclerosis (ICD10-I70.0). Electronically Signed   By: Constance Holster M.D.   On: 12/17/2019 21:19    Procedures Procedures (including critical care time)  Medications Ordered in ED Medications  morphine 4 MG/ML injection 4 mg (4 mg Intravenous Given 12/17/19 2043)  ondansetron (ZOFRAN) injection 4 mg (4 mg Intravenous Given 12/17/19 2043)  morphine 4 MG/ML injection 4 mg (4 mg Intravenous Given 12/17/19 2220)  ciprofloxacin (CIPRO) IVPB 400 mg (0 mg Intravenous Stopped 12/18/19 0045)  metroNIDAZOLE (FLAGYL) IVPB 500 mg (0 mg Intravenous Stopped 12/17/19 2325)    ED Course  I have reviewed the triage vital signs and the nursing notes.  Pertinent labs & imaging results that were available during my care of the patient were reviewed by me and considered in my medical decision making (see chart for details).    MDM Rules/Calculators/A&P                          64 year old male who presents for evaluation of abdominal pain x2 days.  Reports he is also had some diarrhea.  No blood noted in stools but has had some white discharge in the diarrhea.  No fevers.  No vomiting.  On initially arrival, he is afebrile nontoxic-appearing.  Vital signs are stable.   On exam, he has tenderness palpation of diffusely to the abdomen but is worse in the left lower quadrant.  No rigidity, guarding.  No CVA tenderness noted bilaterally.  Normal GU and rectal exam.  Consider intra-abdominal infectious pathology versus GU etiology.  Plan check labs.  CMP shows BUN of 23, creatinine of 1.59.  UA negative for any infectious etiology.  CBC shows slight leukocytosis of 11.8.  Lipase is unremarkable.  Patient with history of renal carcinoma and is never had right nephrectomy.  His CM P shows a creatinine of 1.59 at this time.  We will plan on obtaining a CT scan but without contrast to ensure that he maintains his kidney function.  CT abdomen pelvis shows acute uncomplicated sigmoid diverticulitis.  He has multiple enlarged lymph nodes presumably reactive but would need outpatient follow-up after treatment.  Discussed results with patient.  He is still having some pain.  Will give additional analgesics and reassess.  Reevaluation.  Patient reports feeling better after medications.  He received a dose of antibiotics here in the ED.  At this time, patient is hemodynamically stable with no vomiting no fever.  He has uncomplicated diverticulitis.  Plan to treat with antibiotics and discharge home. Will treat with augmentin given that he is on glipizide and do not want to cause hypoglycemia with Cipro. Patient had ample opportunity for questions and discussion. All patient's questions were answered with full understanding. Strict return precautions discussed. Patient expresses understanding and agreement to plan.   Portions of this note were generated with [employer]. Dictation errors may occur despite best attempts at proofreading.  Final Clinical Impression(s) / ED Diagnoses Final diagnoses:  Diverticulitis  Lymphadenopathy    Rx / DC Orders ED Discharge Orders         Ordered    HYDROcodone-acetaminophen (NORCO/VICODIN) 5-325 MG tablet  Every 4 hours PRN         12/17/19 2344  ondansetron (ZOFRAN ODT) 4 MG disintegrating tablet  Every 8 hours PRN        12/17/19 2344    amoxicillin-clavulanate (AUGMENTIN) 875-125 MG tablet  2 times daily        12/17/19 2344           Desma Mcgregor 12/18/19 2004    Malvin Johns, MD 12/20/19 [phone]

## 2019-12-21 ENCOUNTER — Ambulatory Visit: Payer: Self-pay | Admitting: Family Medicine

## 2019-12-27 ENCOUNTER — Emergency Department (HOSPITAL_COMMUNITY)
Admission: EM | Admit: 2019-12-27 | Discharge: 2019-12-27 | Disposition: A | Discharge status: Home / Self Care | Payer: BLUE CROSS/BLUE SHIELD | Attending: Emergency Medicine | Admitting: Emergency Medicine

## 2019-12-27 ENCOUNTER — Other Ambulatory Visit: Payer: Self-pay

## 2019-12-27 ENCOUNTER — Encounter (HOSPITAL_COMMUNITY): Payer: Self-pay | Admitting: Obstetrics and Gynecology

## 2019-12-27 ENCOUNTER — Ambulatory Visit: Payer: Self-pay | Admitting: *Deleted

## 2019-12-27 DIAGNOSIS — Z5321 Procedure and treatment not carried out due to patient leaving prior to being seen by health care provider: Secondary | ICD-10-CM | POA: Insufficient documentation

## 2019-12-27 DIAGNOSIS — R109 Unspecified abdominal pain: Secondary | ICD-10-CM | POA: Diagnosis not present

## 2019-12-27 DIAGNOSIS — M79604 Pain in right leg: Secondary | ICD-10-CM | POA: Insufficient documentation

## 2019-12-27 DIAGNOSIS — M79605 Pain in left leg: Secondary | ICD-10-CM | POA: Diagnosis present

## 2019-12-27 LAB — CBC
HCT: 43 % (ref 39.0–52.0)
Hemoglobin: 14.3 g/dL (ref 13.0–17.0)
MCH: 28 pg (ref 26.0–34.0)
MCHC: 33.3 g/dL (ref 30.0–36.0)
MCV: 84.1 fL (ref 80.0–100.0)
Platelets: 195 10*3/uL (ref 150–400)
RBC: 5.11 MIL/uL (ref 4.22–5.81)
RDW: 13.6 % (ref 11.5–15.5)
WBC: 10.5 10*3/uL (ref 4.0–10.5)
nRBC: 0 % (ref 0.0–0.2)

## 2019-12-27 LAB — COMPREHENSIVE METABOLIC PANEL
ALT: 52 U/L — ABNORMAL HIGH (ref 0–44)
AST: 19 U/L (ref 15–41)
Albumin: 4 g/dL (ref 3.5–5.0)
Alkaline Phosphatase: 54 U/L (ref 38–126)
Anion gap: 11 (ref 5–15)
BUN: 21 mg/dL (ref 8–23)
CO2: 25 mmol/L (ref 22–32)
Calcium: 9.8 mg/dL (ref 8.9–10.3)
Chloride: 103 mmol/L (ref 98–111)
Creatinine, Ser: 1.43 mg/dL — ABNORMAL HIGH (ref 0.61–1.24)
GFR, Estimated: 52 mL/min — ABNORMAL LOW (ref >60)
Glucose, Bld: 63 mg/dL — ABNORMAL LOW (ref 70–99)
Potassium: 4.1 mmol/L (ref 3.5–5.1)
Sodium: 139 mmol/L (ref 135–145)
Total Bilirubin: 0.7 mg/dL (ref 0.3–1.2)
Total Protein: 8.4 g/dL — ABNORMAL HIGH (ref 6.5–8.1)

## 2019-12-27 LAB — LIPASE, BLOOD: Lipase: 49 U/L (ref 11–51)

## 2019-12-27 NOTE — ED Triage Notes (Signed)
Patient reports to the ER for bilateral leg pain and abdominal pain. Patient reports the pain started last week and receded and then it receded and has come back again last night.

## 2019-12-27 NOTE — Telephone Encounter (Signed)
  C/o abdominal pain upper and lower. Vomiting and loose BMs reported. Swelling in lower extremities, left ankle swollen and patient is unable to walk. Recently seen in [facility] ED for diverticulitis. Patient recently ate [place], mashed potatoes, greens and cornbread. Patient reports pain level at 13. Patient is going to [facility] ED now. Care advise given. Patient verbalized understanding of care advise and to go to ED.   Reason for Disposition . [1] SEVERE pain AND [2] age > 60 years  Answer Assessment - Initial Assessment Questions 1. LOCATION: "Where does it hurt?"      Upper and lower abdomen 2. RADIATION: "Does the pain shoot anywhere else?" (e.g., chest, back)     na 3. ONSET: "When did the pain begin?" (Minutes, hours or days ago)      na 4. SUDDEN: "Gradual or sudden onset?"     na 5. PATTERN "Does the pain come and go, or is it constant?"    - If constant: "Is it getting better, staying the same, or worsening?"      (Note: Constant means the pain never goes away completely; most serious pain is constant and it progresses)     - If intermittent: "How long does it last?" "Do you have pain now?"     (Note: Intermittent means the pain goes away completely between bouts)     constant 6. SEVERITY: "How bad is the pain?"  (e.g., Scale 1-10; mild, moderate, or severe)    - MILD (1-3): doesn't interfere with normal activities, abdomen soft and not tender to touch     - MODERATE (4-7): interferes with normal activities or awakens from sleep, tender to touch     - SEVERE (8-10): excruciating pain, doubled over, unable to do any normal activities       severe 7. RECURRENT SYMPTOM: "Have you ever had this type of stomach pain before?" If Yes, ask: "When was the last time?" and "What happened that time?"      Yes last week. diverticulitits 8. CAUSE: "What do you think is causing the stomach pain?"     Was on full liquid diet. Patient recently ate [place] mashed potatoes, greens and cornbread 9.  RELIEVING/AGGRAVATING FACTORS: "What makes it better or worse?" (e.g., movement, antacids, bowel movement)     Nothing  10. OTHER SYMPTOMS: "Has there been any vomiting, diarrhea, constipation, or urine problems?"       Vomiting and loose stools  Protocols used: ABDOMINAL PAIN - MALE-A-AH

## 2020-01-02 ENCOUNTER — Encounter (HOSPITAL_COMMUNITY): Payer: Self-pay

## 2020-01-02 ENCOUNTER — Other Ambulatory Visit: Payer: Self-pay

## 2020-01-02 ENCOUNTER — Emergency Department (HOSPITAL_COMMUNITY): Payer: BLUE CROSS/BLUE SHIELD

## 2020-01-02 ENCOUNTER — Emergency Department (HOSPITAL_COMMUNITY)
Admission: EM | Admit: 2020-01-02 | Discharge: 2020-01-02 | Disposition: A | Discharge status: Home / Self Care | Payer: BLUE CROSS/BLUE SHIELD | Attending: Emergency Medicine | Admitting: Emergency Medicine

## 2020-01-02 DIAGNOSIS — N183 Chronic kidney disease, stage 3 unspecified: Secondary | ICD-10-CM | POA: Diagnosis not present

## 2020-01-02 DIAGNOSIS — M25572 Pain in left ankle and joints of left foot: Secondary | ICD-10-CM | POA: Insufficient documentation

## 2020-01-02 DIAGNOSIS — Z7984 Long term (current) use of oral hypoglycemic drugs: Secondary | ICD-10-CM | POA: Insufficient documentation

## 2020-01-02 DIAGNOSIS — I129 Hypertensive chronic kidney disease with stage 1 through stage 4 chronic kidney disease, or unspecified chronic kidney disease: Secondary | ICD-10-CM | POA: Diagnosis not present

## 2020-01-02 DIAGNOSIS — E1122 Type 2 diabetes mellitus with diabetic chronic kidney disease: Secondary | ICD-10-CM | POA: Insufficient documentation

## 2020-01-02 MED ORDER — HYDROCODONE-ACETAMINOPHEN 5-325 MG PO TABS: 1.0000 | ORAL_TABLET | Freq: Four times a day (QID) | ORAL | 0 refills | Status: DC | PRN

## 2020-01-02 MED ORDER — OXYCODONE HCL 5 MG PO TABS
5.0000 mg | ORAL_TABLET | Freq: Once | ORAL | Status: AC
  Administered 2020-01-02: 5 mg via ORAL
  Filled 2020-01-02: qty 1

## 2020-01-02 NOTE — ED Provider Notes (Signed)
[facility] DEPT Provider Note   CSN: [id number] Arrival date & time: 01/02/20  1526     History Chief Complaint  Patient presents with   Ankle Pain    Jon Harper is a 64 y.o. male.  Patient with history of diabetes, history of renal cell carcinoma status post nephrectomy -- presents the emergency department for worsening ankle pain and swelling starting about 3 days ago.  Patient states that the pain was initially in the bottom of this foot but has now moved up to his ankle.  He is having difficulty bearing weight and walking because of the pain.  He has taken 800 mg ibuprofen and 1 oxycodone that he had at home.  He denies fever, chills.  No knee or hip pain.  He denies injury.  No history of arthritis or gout.       Past Medical History:  Diagnosis Date   Chronic low back pain    Diabetes mellitus    Hypertension    Neck pain    Neck pain    Skin rash 10/2018   Tooth abscess 10/2018    Patient Active Problem List   Diagnosis Date Noted   Renal mass, right 07/20/2019   Hemoglobin A1c less than 7.0% 02/04/2019   Proteinuria 02/04/2019   Chronic joint pain 02/04/2019   Overgrown toenails 02/04/2019   Tooth abscess 11/03/2018   Tooth pain 11/03/2018   Skin rash 11/03/2018   Chronic bilateral low back pain with bilateral sciatica 09/30/2018   Prediabetes 09/30/2018   PAC (premature atrial contraction) 07/07/2017   Type 2 diabetes mellitus without complication, without long-term current use of insulin ([place]) 04/16/2017   Abnormal EKG 04/16/2017   Essential hypertension 04/16/2017   Hyperlipidemia associated with type 2 diabetes mellitus ([place]) 04/16/2017   Hx of renal cell cancer 04/16/2017   Stage 3 chronic kidney disease ([place]) 04/16/2017   History of unilateral nephrectomy 04/16/2017    Past Surgical History:  Procedure Laterality Date   right nephrectomy         Family History  Problem Relation  Age of Onset   Diabetes Mother        non-insulin    Diabetes Father        non-insulin    Diabetes Sister        non-insulin     Social History   Tobacco Use   Smoking status: Never Smoker   Smokeless tobacco: Never Used  Vaping Use   Vaping Use: Never used  Substance Use Topics   Alcohol use: No   Drug use: No    Home Medications Prior to Admission medications   Medication Sig Start Date End Date Taking? Authorizing Provider  acetaminophen (TYLENOL) 500 MG tablet Take 1 tablet (500 mg total) by mouth in the morning and at bedtime. 07/20/19   Azzie Glatter, FNP  atorvastatin (LIPITOR) 40 MG tablet Take 1 tablet (40 mg total) by mouth daily. 10/26/18   Azzie Glatter, FNP  clotrimazole-betamethasone (LOTRISONE) cream Apply 1 application topically 2 (two) times daily. 11/02/18   Azzie Glatter, FNP  gabapentin (NEURONTIN) 300 MG capsule TAKE 1 CAPSULE (300 MG TOTAL) BY MOUTH 2 (TWO) TIMES DAILY. 06/23/19   Azzie Glatter, FNP  glipiZIDE (GLUCOTROL) 10 MG tablet TAKE 1 TABLET (10 MG TOTAL) BY MOUTH 2 (TWO) TIMES DAILY BEFORE A MEAL. 06/23/19   Azzie Glatter, FNP  HYDROcodone-acetaminophen (NORCO/VICODIN) 5-325 MG tablet Take 1-2 tablets by mouth every  4 (four) hours as needed. 12/17/19   Volanda Napoleon, PA-C  hydrocortisone cream 0.5 % Apply 1 application topically 2 (two) times daily. 10/26/18   Azzie Glatter, FNP  losartan (COZAAR) 50 MG tablet TAKE 2 TABLETS (100 MG TOTAL) BY MOUTH DAILY. Patient not taking: Reported on 07/20/2019 06/23/19   Azzie Glatter, FNP  methocarbamol (ROBAXIN) 750 MG tablet Take 1 tablet (750 mg total) by mouth 2 (two) times daily. 10/26/18   Azzie Glatter, FNP  metoprolol tartrate (LOPRESSOR) 25 MG tablet TAKE 1 TABLET (25 MG TOTAL) BY MOUTH 2 (TWO) TIMES DAILY. 06/23/19   Azzie Glatter, FNP  ondansetron (ZOFRAN ODT) 4 MG disintegrating tablet Take 1 tablet (4 mg total) by mouth every 8 (eight) hours as needed for nausea  or vomiting. 12/17/19   Volanda Napoleon, PA-C  tamsulosin (FLOMAX) 0.4 MG CAPS capsule Take 1 capsule (0.4 mg total) by mouth daily. 10/26/18   Azzie Glatter, FNP    Allergies    Patient has no known allergies.  Review of Systems   Review of Systems  Musculoskeletal: Positive for arthralgias and joint swelling. Negative for back pain and neck pain.  Skin: Negative for wound.  Neurological: Negative for weakness and numbness.    Physical Exam Updated Vital Signs BP 115/77    Pulse 89    Temp 98.4 F (36.9 C)    Resp 17    SpO2 99%   Physical Exam Vitals and nursing note reviewed.  Constitutional:      Appearance: He is well-developed.  HENT:     Head: Normocephalic and atraumatic.  Eyes:     Conjunctiva/sclera: Conjunctivae normal.  Cardiovascular:     Pulses: Normal pulses. No decreased pulses.  Musculoskeletal:        General: Tenderness present.     Cervical back: Normal range of motion and neck supple.     Right ankle: No swelling. No tenderness.     Left ankle: Swelling present. Tenderness present over the lateral malleolus. Decreased range of motion.     Comments: Compared bilateral feet.  Mild swelling of the left foot laterally compared to the right.  2+ DP pulses bilaterally.  No cellulitis or wounds noted.  Skin:    General: Skin is warm and dry.  Neurological:     Mental Status: He is alert.     Sensory: No sensory deficit.     Comments: Motor, sensation, and vascular distal to the injury is fully intact.      ED Results / Procedures / Treatments   Labs (all labs ordered are listed, but only abnormal results are displayed) Labs Reviewed - No data to display  EKG None  Radiology No results found.  Procedures Procedures (including critical care time)  Medications Ordered in ED Medications  oxyCODONE (Oxy IR/ROXICODONE) immediate release tablet 5 mg (5 mg Oral Given 01/02/20 1555)    ED Course  I have reviewed the triage vital signs and the  nursing notes.  Pertinent labs & imaging results that were available during my care of the patient were reviewed by me and considered in my medical decision making (see chart for details).  Patient seen and examined. Work-up initiated. Medications ordered.   Vital signs reviewed and are as follows: BP 115/77    Pulse 89    Temp 98.4 F (36.9 C)    Resp 17    SpO2 99%   Personally reviewed patient's ankle films.  No evidence of  effusion.  Patient updated on results.  Plan: ASO, hydrocodone x6 tablets, continued NSAIDs at home.  We will give orthopedic referral in case symptoms are not improving.  Encourage patient to return with worsening pain, inability to ambulate, worsening swelling or redness about the ankle or lower leg, fevers.  He verbalizes understanding and agrees with plan.  Patient counseled on use of narcotic pain medications. Counseled not to combine these medications with others containing tylenol. Urged not to drink alcohol, drive, or perform any other activities that requires focus while taking these medications. The patient verbalizes understanding and agrees with the plan.    MDM Rules/Calculators/A&P                          Patient with acute onset of left ankle pain without injury.  Patient does have quite a bit of pain with movement of the ankle.  He has mild lateral swelling.  No large effusion noted clinically or by x-ray.  Skin is not warm or red.  He is not febrile and has not had other systemic symptoms of illness.  I have a low overall clinical concern for septic arthritis.  Gout considered, however patient has no history.  No signs of superficial cellulitis, ulceration, abscess.  Patient encouraged to follow-up with orthopedist if symptoms persist. Return instructions discussed as above.    Final Clinical Impression(s) / ED Diagnoses Final diagnoses:  Acute left ankle pain    Rx / DC Orders ED Discharge Orders         Ordered    HYDROcodone-acetaminophen  (NORCO/VICODIN) 5-325 MG tablet  Every 6 hours PRN        01/02/20 1637           Carlisle Cater, PA-C 01/02/20 1640    Little, Wenda Overland, MD 01/05/20 [phone]

## 2020-01-02 NOTE — Progress Notes (Signed)
Orthopedic Tech Progress Note Patient Details:  Jon Harper Mar 02, 1956 [id number]  Ortho Devices Ortho Device/Splint Location: applied aso LLE Ortho Device/Splint Interventions: Ordered, Application   Post Interventions Patient Tolerated: Well Instructions Provided: Care of device   Braulio Bosch 01/02/2020, 4:45 PM

## 2020-01-02 NOTE — Discharge Instructions (Signed)
Please read and follow all provided instructions.  Your diagnoses today include:  1. Acute left ankle pain     Tests performed today include:  An x-ray of your ankle - does NOT show any broken bones or swelling  Vital signs. See below for your results today.   Medications prescribed:   Vicodin (hydrocodone/acetaminophen) - narcotic pain medication  DO NOT drive or perform any activities that require you to be awake and alert because this medicine can make you drowsy. BE VERY CAREFUL not to take multiple medicines containing Tylenol (also called acetaminophen). Doing so can lead to an overdose which can damage your liver and cause liver failure and possibly death.  Take any prescribed medications only as directed.  Home care instructions:   Follow any educational materials contained in this packet  Follow R.I.C.E. Protocol:  R - rest your injury   I  - use ice on injury without applying directly to skin  C - compress injury with bandage or splint  E - elevate the injury as much as possible  Follow-up instructions: Please follow-up with your primary care provider or the provided orthopedic (bone specialist) if you continue to have significant pain or trouble walking in 1 week. In this case you may have a severe sprain that requires further care.   Return instructions:   Please return if your toes are numb or tingling, appear gray or blue, or you have severe pain (also elevate leg and loosen splint or wrap)  Please return to the Emergency Department if you experience worsening symptoms.   Please return if you have any other emergent concerns.  Additional Information:  Your vital signs today were: BP 115/77   Pulse 89   Temp 98.4 F (36.9 C)   Resp 17   SpO2 99%  If your blood pressure (BP) was elevated above 135/85 this visit, please have this repeated by your doctor within one month. --------------

## 2020-01-02 NOTE — ED Triage Notes (Signed)
Pt arrived via walk in, c/o left foot and ankle pain /swelling. Denies any trauma.

## 2020-01-05 IMAGING — CR DG HUMERUS 2V *L*
2 series · 2 of 2 positions shown · non-contrast
Comparison: Left shoulder x-rays dated December 03, 2014.

CLINICAL DATA: Left arm pain and swelling.  No injury.

EXAM:
LEFT SHOULDER - 2+ VIEW; LEFT HUMERUS - 2+ VIEW

[x humerus lat left]
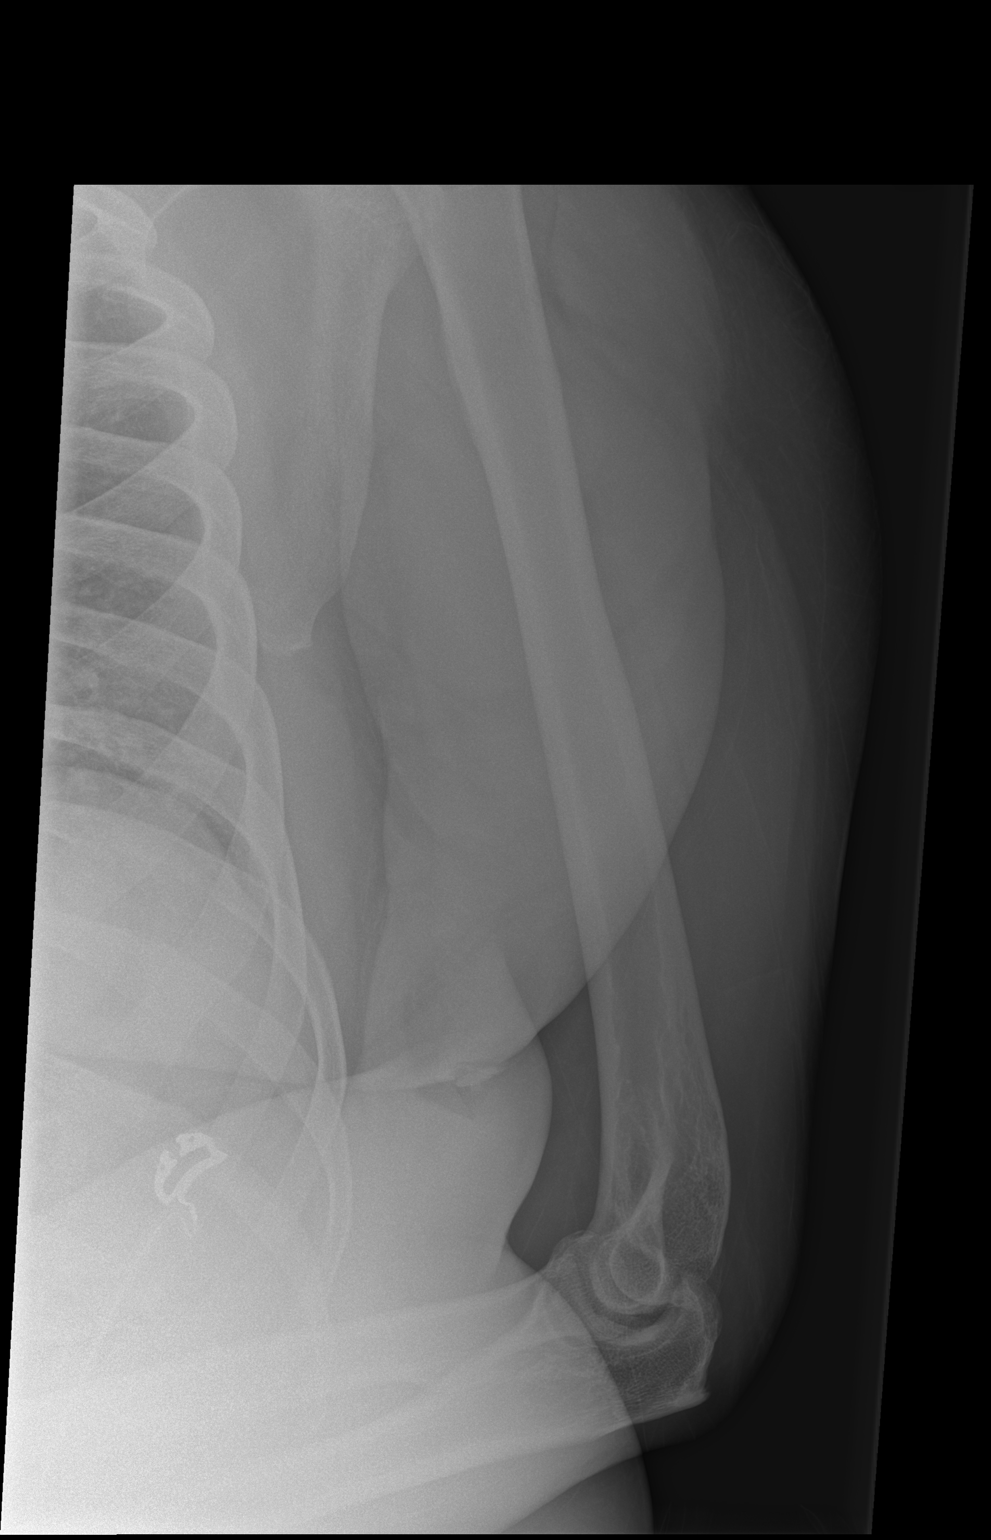

[x humerus ap left]
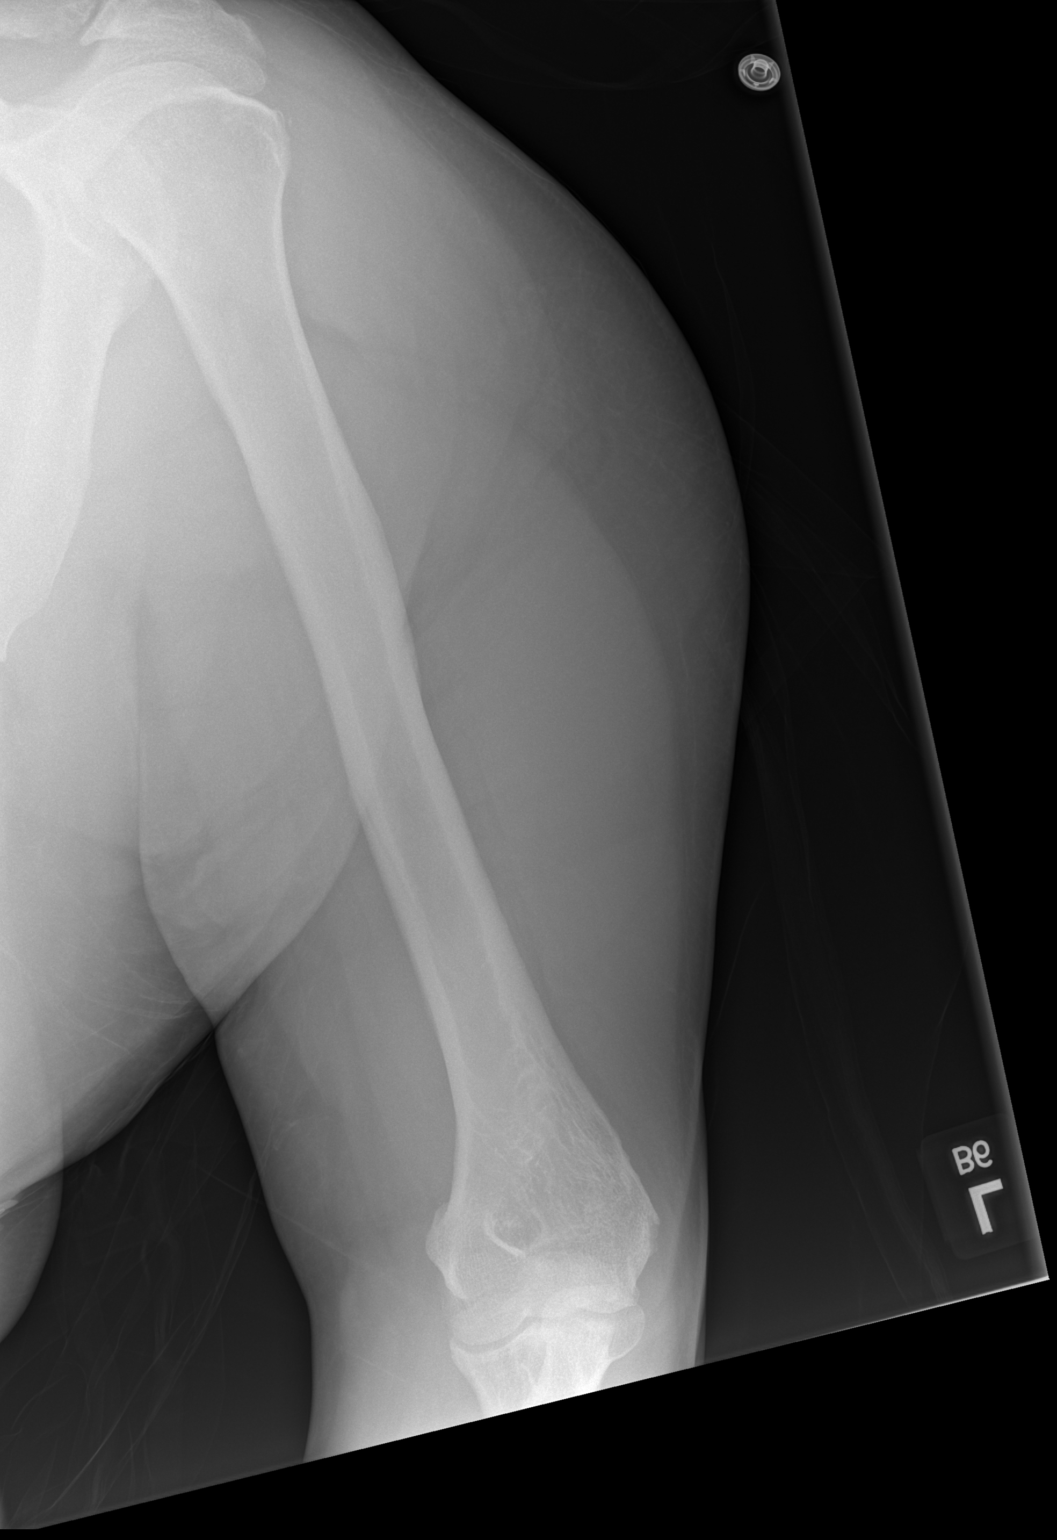

[2 of 2 positions shown; findings below may reference images not displayed]

FINDINGS: No acute fracture or dislocation. Mild acromioclavicular
osteoarthritis. Previous rotator cuff calcific tendinitis has
resolved. Bone mineralization is normal. Soft tissues are
unremarkable.
IMPRESSION: 1. No acute osseous abnormality of the left shoulder and humerus.
2. Mild acromioclavicular osteoarthritis.
3. Resolved rotator cuff calcific tendinitis.

## 2020-01-05 IMAGING — CR DG FOREARM 2V*L*
3 series · 3 of 3 positions shown · non-contrast
Comparison: Left forearm x-rays dated December 29, 2010.

CLINICAL DATA: Left arm pain and swelling.

EXAM:
LEFT FOREARM - 2 VIEW

[x forearm lat left (1 of 2)]
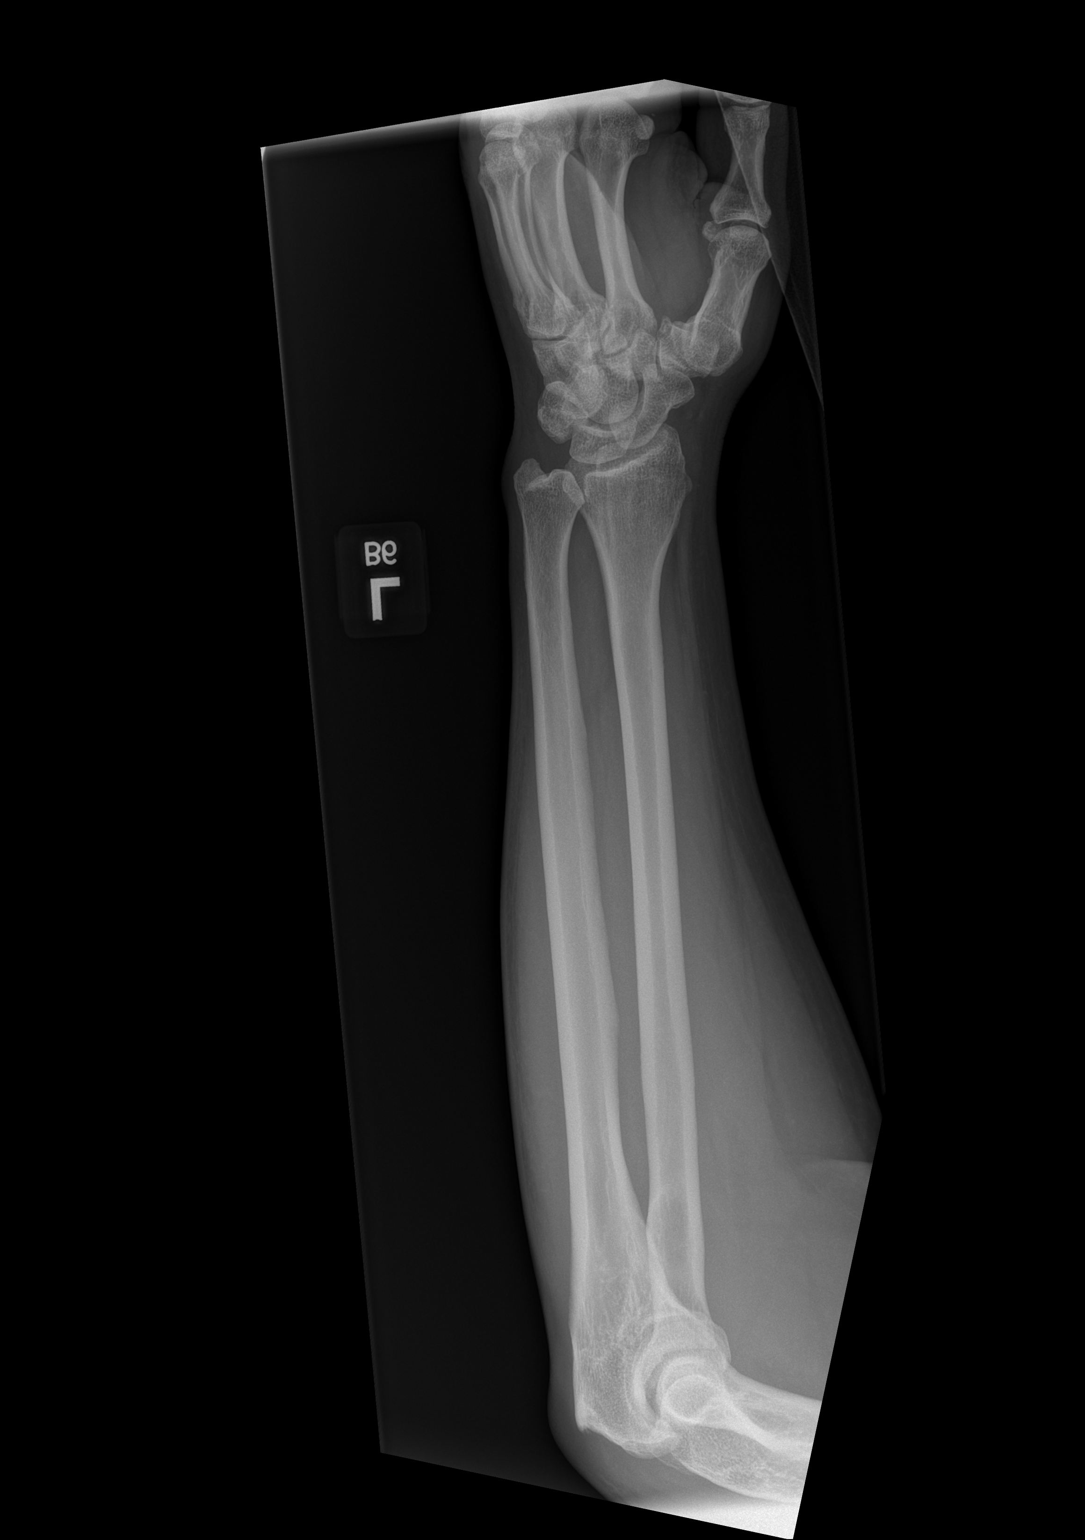

[x forearm lat left (2 of 2)]
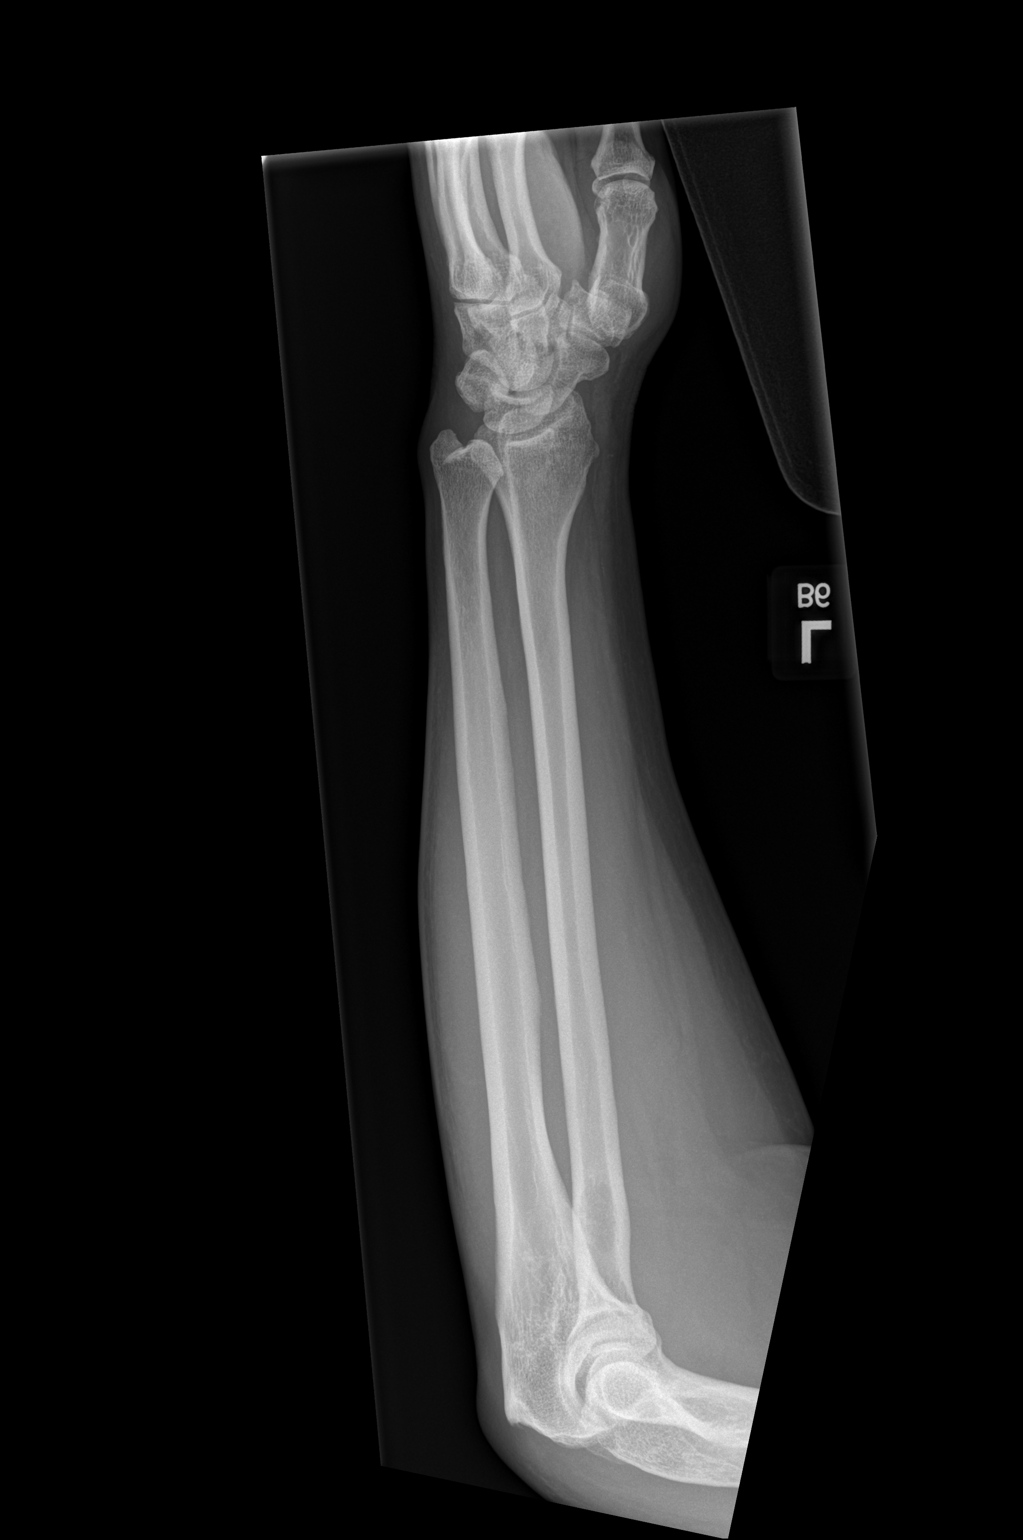

[x forearm ap left]
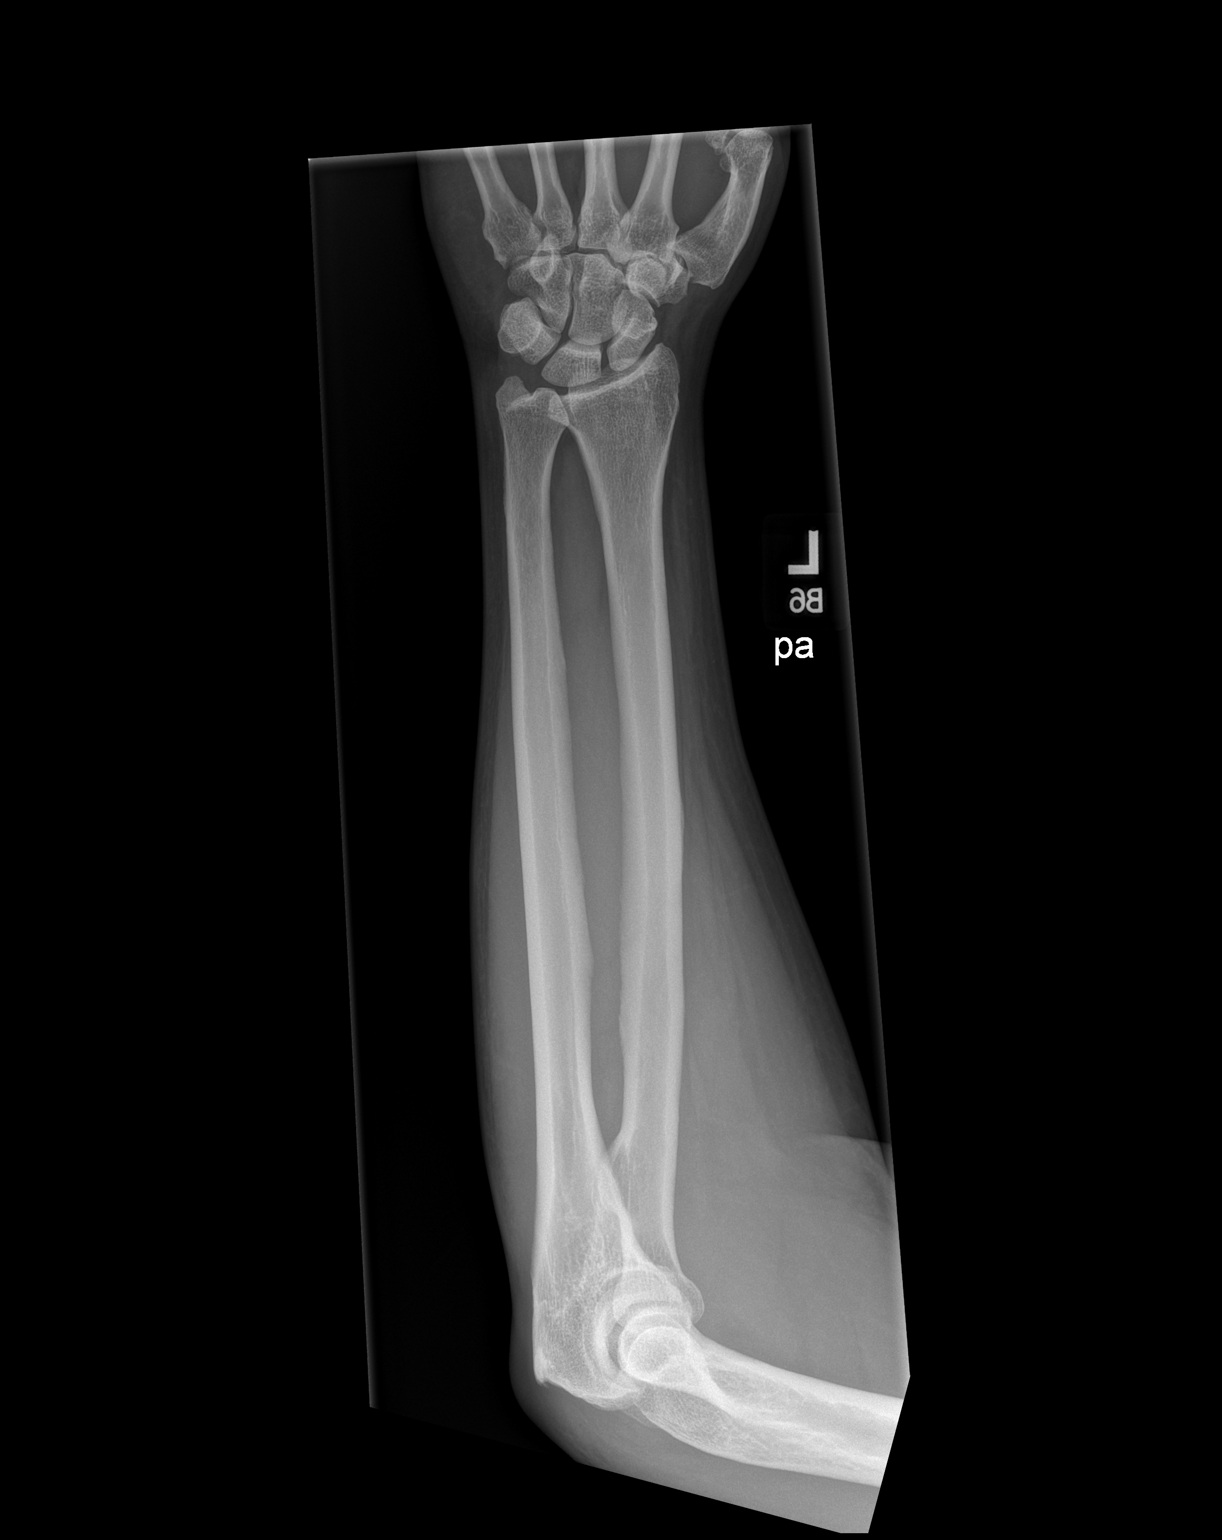

[3 of 3 positions shown; findings below may reference images not displayed]

FINDINGS: There is no evidence of fracture or other focal bone lesions. Soft
tissues are unremarkable.
IMPRESSION: Negative.

## 2020-01-19 ENCOUNTER — Other Ambulatory Visit: Payer: Self-pay | Admitting: Family Medicine

## 2020-01-19 DIAGNOSIS — G629 Polyneuropathy, unspecified: Secondary | ICD-10-CM

## 2020-01-19 DIAGNOSIS — I10 Essential (primary) hypertension: Secondary | ICD-10-CM

## 2020-01-19 DIAGNOSIS — E119 Type 2 diabetes mellitus without complications: Secondary | ICD-10-CM

## 2020-01-25 ENCOUNTER — Ambulatory Visit (INDEPENDENT_AMBULATORY_CARE_PROVIDER_SITE_OTHER): Payer: BLUE CROSS/BLUE SHIELD | Admitting: Family Medicine

## 2020-01-25 ENCOUNTER — Encounter: Payer: Self-pay | Admitting: Family Medicine

## 2020-01-25 ENCOUNTER — Other Ambulatory Visit: Payer: Self-pay

## 2020-01-25 ENCOUNTER — Other Ambulatory Visit: Payer: Self-pay | Admitting: Family Medicine

## 2020-01-25 VITALS — BP 148/84 | HR 77 | Temp 99.0°F | Resp 17 | Ht 71.0 in | Wt 241.2 lb

## 2020-01-25 DIAGNOSIS — M5441 Lumbago with sciatica, right side: Secondary | ICD-10-CM

## 2020-01-25 DIAGNOSIS — K5792 Diverticulitis of intestine, part unspecified, without perforation or abscess without bleeding: Secondary | ICD-10-CM

## 2020-01-25 DIAGNOSIS — G8929 Other chronic pain: Secondary | ICD-10-CM

## 2020-01-25 DIAGNOSIS — E119 Type 2 diabetes mellitus without complications: Secondary | ICD-10-CM | POA: Diagnosis not present

## 2020-01-25 DIAGNOSIS — M5442 Lumbago with sciatica, left side: Secondary | ICD-10-CM

## 2020-01-25 DIAGNOSIS — M542 Cervicalgia: Secondary | ICD-10-CM

## 2020-01-25 DIAGNOSIS — Z09 Encounter for follow-up examination after completed treatment for conditions other than malignant neoplasm: Secondary | ICD-10-CM | POA: Diagnosis not present

## 2020-01-25 DIAGNOSIS — G629 Polyneuropathy, unspecified: Secondary | ICD-10-CM

## 2020-01-25 DIAGNOSIS — H539 Unspecified visual disturbance: Secondary | ICD-10-CM

## 2020-01-25 DIAGNOSIS — I10 Essential (primary) hypertension: Secondary | ICD-10-CM

## 2020-01-25 DIAGNOSIS — E785 Hyperlipidemia, unspecified: Secondary | ICD-10-CM

## 2020-01-25 DIAGNOSIS — M2550 Pain in unspecified joint: Secondary | ICD-10-CM

## 2020-01-25 DIAGNOSIS — R7309 Other abnormal glucose: Secondary | ICD-10-CM | POA: Diagnosis not present

## 2020-01-25 DIAGNOSIS — M25579 Pain in unspecified ankle and joints of unspecified foot: Secondary | ICD-10-CM

## 2020-01-25 MED ORDER — GABAPENTIN 300 MG PO CAPS: 300.0000 mg | ORAL_CAPSULE | Freq: Two times a day (BID) | ORAL | 3 refills | Status: DC

## 2020-01-25 MED ORDER — ONDANSETRON 4 MG PO TBDP: 4.0000 mg | ORAL_TABLET | Freq: Three times a day (TID) | ORAL | 2 refills | Status: DC | PRN

## 2020-01-25 MED ORDER — GLIPIZIDE 10 MG PO TABS: 10.0000 mg | ORAL_TABLET | Freq: Two times a day (BID) | ORAL | 3 refills | Status: DC

## 2020-01-25 MED ORDER — TAMSULOSIN HCL 0.4 MG PO CAPS
0.4000 mg | ORAL_CAPSULE | Freq: Every day | ORAL | 3 refills | Status: DC
Start: 2020-01-25 — End: 2020-01-25

## 2020-01-25 MED ORDER — METHOCARBAMOL 750 MG PO TABS: 750.0000 mg | ORAL_TABLET | Freq: Two times a day (BID) | ORAL | 11 refills | Status: DC

## 2020-01-25 MED ORDER — LOSARTAN POTASSIUM 50 MG PO TABS: 100.0000 mg | ORAL_TABLET | Freq: Every day | ORAL | 3 refills | Status: DC

## 2020-01-25 MED ORDER — ATORVASTATIN CALCIUM 40 MG PO TABS: 40.0000 mg | ORAL_TABLET | Freq: Every day | ORAL | 3 refills | Status: DC

## 2020-01-25 MED ORDER — METOPROLOL TARTRATE 25 MG PO TABS: 25.0000 mg | ORAL_TABLET | Freq: Two times a day (BID) | ORAL | 3 refills | Status: DC

## 2020-01-25 MED ORDER — ACETAMINOPHEN 500 MG PO TABS: 500.0000 mg | ORAL_TABLET | Freq: Two times a day (BID) | ORAL | 11 refills | Status: DC

## 2020-01-25 MED FILL — GABAPENTIN 300 MG CAPSULE: 300 | 90 days supply | Qty: 180 | Fill #0

## 2020-01-25 MED FILL — glipiZIDE 10 MG TABS: 10 | 90 days supply | Qty: 180 | Fill #0

## 2020-01-25 MED FILL — TAMSULOSIN HCL 0.4 MG CAP: 0.4 | 90 days supply | Qty: 90 | Fill #0

## 2020-01-25 MED FILL — LOSARTAN POTASSIUM 50 MG TA: 50 | 90 days supply | Qty: 180 | Fill #0

## 2020-01-25 MED FILL — METHOCARBAMOL 750 MG TABS: 750 | 30 days supply | Qty: 60 | Fill #0

## 2020-01-25 MED FILL — METOPROLOL TARTRATE 25 MG T: 25 | 90 days supply | Qty: 180 | Fill #0

## 2020-01-25 MED FILL — ONDANSETRON ODT 4 MG TABLET: 4 | 6 days supply | Qty: 20 | Fill #0

## 2020-01-25 MED FILL — ATORVASTATIN CALCIUM 40 MG: 40 | 90 days supply | Qty: 90 | Fill #0

## 2020-01-25 NOTE — Patient Instructions (Signed)

## 2020-01-25 NOTE — Progress Notes (Signed)
[facility]    Established Patient Office Visit  Subjective:  Patient ID: Jon Harper, male    DOB: [birth year]  Age: 64 y.o. MRN: [id number]  CC:  Chief Complaint  Patient presents with  . Follow-up  . Foot Swelling    L X 1 month.    HPI Jon Harper is a 64 year old male who presents for Hospital Follow Up today.     Patient Active Problem List   Diagnosis Date Noted  . Renal mass, right 07/20/2019  . Hemoglobin A1c less than 7.0% 02/04/2019  . Proteinuria 02/04/2019  . Chronic joint pain 02/04/2019  . Overgrown toenails 02/04/2019  . Tooth abscess 11/03/2018  . Tooth pain 11/03/2018  . Skin rash 11/03/2018  . Chronic bilateral low back pain with bilateral sciatica 09/30/2018  . Prediabetes 09/30/2018  . PAC (premature atrial contraction) 07/07/2017  . Type 2 diabetes mellitus without complication, without long-term current use of insulin ([place]) 04/16/2017  . Abnormal EKG 04/16/2017  . Essential hypertension 04/16/2017  . Hyperlipidemia associated with type 2 diabetes mellitus ([place]) 04/16/2017  . Hx of renal cell cancer 04/16/2017  . Stage 3 chronic kidney disease ([place]) 04/16/2017  . History of unilateral nephrectomy 04/16/2017   Current Status: Since his last office visit, he is doing well with no complaints. He continues to have c/o moderate abdominal pain. Denies GI problems such as nausea, vomiting, diarrhea, and constipation. He has no reports of blood in stools, dysuria and hematuria.He denies fevers, chills, fatigue, recent infections, weight loss, and night sweats. He has not had any headaches, visual changes, dizziness, and falls. No chest pain, heart palpitations, cough and shortness of breath reported.  No depression or anxiety reported today. He is taking all medications as prescribed.  Past Medical History:  Diagnosis Date  . Chronic low back pain   . Diabetes mellitus   . Hypertension   . Neck pain    . Neck pain   . Skin rash 10/2018  . Tooth abscess 10/2018    Past Surgical History:  Procedure Laterality Date  . right nephrectomy      Family History  Problem Relation Age of Onset  . Diabetes Mother        non-insulin   . Diabetes Father        non-insulin   . Diabetes Sister        non-insulin     Social History   Socioeconomic History  . Marital status: Married    Spouse name: Not on file  . Number of children: Not on file  . Years of education: Not on file  . Highest education level: Not on file  Occupational History  . Not on file  Tobacco Use  . Smoking status: Never Smoker  . Smokeless tobacco: Never Used  Vaping Use  . Vaping Use: Never used  Substance and Sexual Activity  . Alcohol use: No  . Drug use: No  . Sexual activity: Yes    Birth control/protection: Condom  Other Topics Concern  . Not on file  Social History Narrative   Divorced.   Does exercise as much as possible.  Tries to go to gym at least 3 days a week.   Social Determinants of Health   Financial Resource Strain:   . Difficulty of Paying Living Expenses: Not on file  Food Insecurity:   . Worried About Charity fundraiser in the Last Year: Not  on file  . Ran Out of Food in the Last Year: Not on file  Transportation Needs:   . Lack of Transportation (Medical): Not on file  . Lack of Transportation (Non-Medical): Not on file  Physical Activity:   . Days of Exercise per Week: Not on file  . Minutes of Exercise per Session: Not on file  Stress:   . Feeling of Stress : Not on file  Social Connections:   . Frequency of Communication with Friends and Family: Not on file  . Frequency of Social Gatherings with Friends and Family: Not on file  . Attends Religious Services: Not on file  . Active Member of Clubs or Organizations: Not on file  . Attends Archivist Meetings: Not on file  . Marital Status: Not on file  Intimate Partner Violence:   . Fear of Current or  Ex-Partner: Not on file  . Emotionally Abused: Not on file  . Physically Abused: Not on file  . Sexually Abused: Not on file    Outpatient Medications Prior to Visit  Medication Sig Dispense Refill  . losartan (COZAAR) 50 MG tablet TAKE 2 TABLETS (100 MG TOTAL) BY MOUTH DAILY. 60 tablet 3  . metoprolol tartrate (LOPRESSOR) 25 MG tablet TAKE 1 TABLET (25 MG TOTAL) BY MOUTH 2 (TWO) TIMES DAILY. 60 tablet 3  . ondansetron (ZOFRAN ODT) 4 MG disintegrating tablet Take 1 tablet (4 mg total) by mouth every 8 (eight) hours as needed for nausea or vomiting. 6 tablet 0  . tamsulosin (FLOMAX) 0.4 MG CAPS capsule Take 1 capsule (0.4 mg total) by mouth daily. 30 capsule 3  . clotrimazole-betamethasone (LOTRISONE) cream Apply 1 application topically 2 (two) times daily. (Patient not taking: Reported on 01/25/2020) 30 g 3  . hydrocortisone cream 0.5 % Apply 1 application topically 2 (two) times daily. (Patient not taking: Reported on 01/25/2020) 30 g 3  . acetaminophen (TYLENOL) 500 MG tablet Take 1 tablet (500 mg total) by mouth in the morning and at bedtime. (Patient not taking: Reported on 01/25/2020) 60 tablet 5  . atorvastatin (LIPITOR) 40 MG tablet Take 1 tablet (40 mg total) by mouth daily. (Patient not taking: Reported on 01/25/2020) 30 tablet 3  . gabapentin (NEURONTIN) 300 MG capsule TAKE 1 CAPSULE (300 MG TOTAL) BY MOUTH 2 (TWO) TIMES DAILY. (Patient not taking: Reported on 01/25/2020) 60 capsule 3  . glipiZIDE (GLUCOTROL) 10 MG tablet TAKE 1 TABLET (10 MG TOTAL) BY MOUTH 2 (TWO) TIMES DAILY BEFORE A MEAL. (Patient not taking: Reported on 01/25/2020) 60 tablet 3  . HYDROcodone-acetaminophen (NORCO/VICODIN) 5-325 MG tablet Take 1 tablet by mouth every 6 (six) hours as needed for severe pain. 6 tablet 0  . methocarbamol (ROBAXIN) 750 MG tablet Take 1 tablet (750 mg total) by mouth 2 (two) times daily. (Patient not taking: Reported on 01/25/2020) 60 tablet 3   No facility-administered medications prior  to visit.    No Known Allergies  ROS Review of Systems  Constitutional: Negative.   HENT: Negative.   Eyes: Negative.   Respiratory: Negative.   Cardiovascular: Negative.   Gastrointestinal: Positive for abdominal pain (occasional ).  Endocrine: Negative.   Genitourinary: Negative.   Musculoskeletal: Positive for arthralgias (generalized).       Bilateral foot/ankle pain  Skin: Negative.   Allergic/Immunologic: Negative.   Neurological: Positive for dizziness (occasional ) and headaches (occasional ).  Hematological: Negative.   Psychiatric/Behavioral: Negative.       Objective:    Physical Exam  Vitals and nursing note reviewed.  Constitutional:      Appearance: Normal appearance.  HENT:     Head: Normocephalic and atraumatic.     Nose: Nose normal.     Mouth/Throat:     Mouth: Mucous membranes are moist.     Pharynx: Oropharynx is clear.  Cardiovascular:     Rate and Rhythm: Normal rate and regular rhythm.     Pulses: Normal pulses.     Heart sounds: Normal heart sounds.  Pulmonary:     Effort: Pulmonary effort is normal.     Breath sounds: Normal breath sounds.  Abdominal:     General: Bowel sounds are normal.     Palpations: Abdomen is soft.  Musculoskeletal:        General: Normal range of motion.     Cervical back: Normal range of motion and neck supple.  Skin:    General: Skin is warm and dry.  Neurological:     General: No focal deficit present.     Mental Status: He is alert and oriented to person, place, and time.  Psychiatric:        Mood and Affect: Mood normal.        Behavior: Behavior normal.        Thought Content: Thought content normal.        Judgment: Judgment normal.     BP (!) 148/84 (BP Location: Left Arm, Patient Position: Sitting, Cuff Size: Large)   Pulse 77   Temp 99 F (37.2 C)   Resp 17   Ht 5\' 11"  (1.803 m)   Wt 241 lb 3.2 oz (109.4 kg)   SpO2 100%   BMI 33.64 kg/m  Wt Readings from Last 3 Encounters:  01/25/20 241  lb 3.2 oz (109.4 kg)  07/20/19 248 lb 3.2 oz (112.6 kg)  02/02/19 252 lb 4.8 oz (114.4 kg)     Health Maintenance Due  Topic Date Due  . PNEUMOCOCCAL POLYSACCHARIDE VACCINE AGE 56-64 HIGH RISK  Never done  . FOOT EXAM  Never done  . OPHTHALMOLOGY EXAM  Never done  . COVID-19 Vaccine (1) Never done  . TETANUS/TDAP  Never done  . COLONOSCOPY  Never done  . INFLUENZA VACCINE  10/10/2019  . HEMOGLOBIN A1C  01/20/2020    There are no preventive care reminders to display for this patient.  Lab Results  Component Value Date   TSH 1.370 04/16/2017   Lab Results  Component Value Date   WBC 10.5 12/27/2019   HGB 14.3 12/27/2019   HCT 43.0 12/27/2019   MCV 84.1 12/27/2019   PLT 195 12/27/2019   Lab Results  Component Value Date   NA 139 12/27/2019   K 4.1 12/27/2019   CO2 25 12/27/2019   GLUCOSE 63 (L) 12/27/2019   BUN 21 12/27/2019   CREATININE 1.43 (H) 12/27/2019   BILITOT 0.7 12/27/2019   ALKPHOS 54 12/27/2019   AST 19 12/27/2019   ALT 52 (H) 12/27/2019   PROT 8.4 (H) 12/27/2019   ALBUMIN 4.0 12/27/2019   CALCIUM 9.8 12/27/2019   ANIONGAP 11 12/27/2019   Lab Results  Component Value Date   CHOL 206 (H) 04/16/2017   Lab Results  Component Value Date   HDL 60 04/16/2017   Lab Results  Component Value Date   LDLCALC 133 (H) 04/16/2017   Lab Results  Component Value Date   TRIG 66 04/16/2017   Lab Results  Component Value Date   CHOLHDL 3.4 04/16/2017  Lab Results  Component Value Date   HGBA1C 6.6 (A) 07/20/2019    Assessment & Plan:   1. Hospital discharge follow-up  2. Diverticulitis Stable.   3. Type 2 diabetes mellitus without complication, without long-term current use of insulin ([place]) He will continue medication as prescribed, to decrease foods/beverages high in sugars and carbs and follow Heart Healthy or DASH diet. Increase physical activity to at least 30 minutes cardio exercise daily.  - Urinalysis Dipstick - POC Glucose (CBG) - POC  HgB A1c - gabapentin (NEURONTIN) 300 MG capsule; Take 1 capsule (300 mg total) by mouth 2 (two) times daily.  Dispense: 180 capsule; Refill: 3 - glipiZIDE (GLUCOTROL) 10 MG tablet; Take 1 tablet (10 mg total) by mouth 2 (two) times daily before a meal.  Dispense: 180 tablet; Refill: 3 - Lipid Panel - TSH - Vitamin B12 - Vitamin D, 25-hydroxy - PSA  4. Hemoglobin A1c less than 7.0% Most recent Hgb A1c at 6.6. Monitor.   5. Hypertension, unspecified type The current medical regimen is effective; blood pressure is stable at 148/84 today; continue present plan and medications as prescribed. He will continue to take medications as prescribed, to decrease high sodium intake, excessive alcohol intake, increase potassium intake, smoking cessation, and increase physical activity of at least 30 minutes of cardio activity daily. He will continue to follow Heart Healthy or DASH diet. - metoprolol tartrate (LOPRESSOR) 25 MG tablet; Take 1 tablet (25 mg total) by mouth 2 (two) times daily.  Dispense: 180 tablet; Refill: 3 - losartan (COZAAR) 50 MG tablet; Take 2 tablets (100 mg total) by mouth daily.  Dispense: 180 tablet; Refill: 3  6. Hyperlipidemia, unspecified hyperlipidemia type - atorvastatin (LIPITOR) 40 MG tablet; Take 1 tablet (40 mg total) by mouth daily.  Dispense: 90 tablet; Refill: 3  7. Chronic joint pain - acetaminophen (TYLENOL) 500 MG tablet; Take 1 tablet (500 mg total) by mouth in the morning and at bedtime.  Dispense: 60 tablet; Refill: 11  8. Neuropathy - gabapentin (NEURONTIN) 300 MG capsule; Take 1 capsule (300 mg total) by mouth 2 (two) times daily.  Dispense: 180 capsule; Refill: 3  9. Chronic bilateral low back pain with bilateral sciatica - methocarbamol (ROBAXIN) 750 MG tablet; Take 1 tablet (750 mg total) by mouth 2 (two) times daily.  Dispense: 60 tablet; Refill: 11  10. Neck pain - methocarbamol (ROBAXIN) 750 MG tablet; Take 1 tablet (750 mg total) by mouth 2 (two)  times daily.  Dispense: 60 tablet; Refill: 11  11. Pain in joint involving ankle and foot, unspecified laterality - Ambulatory referral to Podiatry  12. Visual changes - Ambulatory referral to Ophthalmology  13. Follow up He will follow up in 6 months.   Meds ordered this encounter  Medications  . metoprolol tartrate (LOPRESSOR) 25 MG tablet    Sig: Take 1 tablet (25 mg total) by mouth 2 (two) times daily.    Dispense:  180 tablet    Refill:  3  . losartan (COZAAR) 50 MG tablet    Sig: Take 2 tablets (100 mg total) by mouth daily.    Dispense:  180 tablet    Refill:  3  . ondansetron (ZOFRAN ODT) 4 MG disintegrating tablet    Sig: Take 1 tablet (4 mg total) by mouth every 8 (eight) hours as needed for nausea or vomiting.    Dispense:  20 tablet    Refill:  2  . tamsulosin (FLOMAX) 0.4 MG CAPS capsule  Sig: Take 1 capsule (0.4 mg total) by mouth daily.    Dispense:  90 capsule    Refill:  3  . atorvastatin (LIPITOR) 40 MG tablet    Sig: Take 1 tablet (40 mg total) by mouth daily.    Dispense:  90 tablet    Refill:  3  . acetaminophen (TYLENOL) 500 MG tablet    Sig: Take 1 tablet (500 mg total) by mouth in the morning and at bedtime.    Dispense:  60 tablet    Refill:  11  . gabapentin (NEURONTIN) 300 MG capsule    Sig: Take 1 capsule (300 mg total) by mouth 2 (two) times daily.    Dispense:  180 capsule    Refill:  3  . glipiZIDE (GLUCOTROL) 10 MG tablet    Sig: Take 1 tablet (10 mg total) by mouth 2 (two) times daily before a meal.    Dispense:  180 tablet    Refill:  3  . methocarbamol (ROBAXIN) 750 MG tablet    Sig: Take 1 tablet (750 mg total) by mouth 2 (two) times daily.    Dispense:  60 tablet    Refill:  11    Orders Placed This Encounter  Procedures  . Lipid Panel  . TSH  . Vitamin B12  . Vitamin D, 25-hydroxy  . PSA  . Ambulatory referral to Podiatry  . Ambulatory referral to Ophthalmology  . Urinalysis Dipstick  . POC Glucose (CBG)  . POC HgB  A1c     Referral Orders     Ambulatory referral to Podiatry     Ambulatory referral to Ophthalmology   Kathe Becton,  MSN, FNP-BC [facility] [place], [place] [address] [phone] fax  Problem List Items Addressed This Visit      Endocrine   Type 2 diabetes mellitus without complication, without long-term current use of insulin ([facility])   Relevant Medications   losartan (COZAAR) 50 MG tablet   atorvastatin (LIPITOR) 40 MG tablet   gabapentin (NEURONTIN) 300 MG capsule   glipiZIDE (GLUCOTROL) 10 MG tablet   Other Relevant Orders   Urinalysis Dipstick   POC Glucose (CBG)   POC HgB A1c   Lipid Panel   TSH   Vitamin B12   Vitamin D, 25-hydroxy   PSA     Nervous and Auditory   Chronic bilateral low back pain with bilateral sciatica   Relevant Medications   acetaminophen (TYLENOL) 500 MG tablet   gabapentin (NEURONTIN) 300 MG capsule   methocarbamol (ROBAXIN) 750 MG tablet     Other   Chronic joint pain   Relevant Medications   acetaminophen (TYLENOL) 500 MG tablet   gabapentin (NEURONTIN) 300 MG capsule   methocarbamol (ROBAXIN) 750 MG tablet   Hemoglobin A1c less than 7.0%    Other Visit Diagnoses    Hospital discharge follow-up    -  Primary   Diverticulitis       Hypertension, unspecified type       Relevant Medications   metoprolol tartrate (LOPRESSOR) 25 MG tablet   losartan (COZAAR) 50 MG tablet   atorvastatin (LIPITOR) 40 MG tablet   Hyperlipidemia, unspecified hyperlipidemia type       Relevant Medications   metoprolol tartrate (LOPRESSOR) 25 MG tablet   losartan (COZAAR) 50 MG tablet   atorvastatin (LIPITOR) 40 MG tablet   Neuropathy       Relevant  Medications   gabapentin (NEURONTIN) 300 MG capsule   Neck pain       Relevant Medications   methocarbamol (ROBAXIN) 750 MG tablet   Pain in joint involving ankle and foot, unspecified  laterality       Relevant Orders   Ambulatory referral to Podiatry   Visual changes       Relevant Orders   Ambulatory referral to Ophthalmology   Follow up          Meds ordered this encounter  Medications  . metoprolol tartrate (LOPRESSOR) 25 MG tablet    Sig: Take 1 tablet (25 mg total) by mouth 2 (two) times daily.    Dispense:  180 tablet    Refill:  3  . losartan (COZAAR) 50 MG tablet    Sig: Take 2 tablets (100 mg total) by mouth daily.    Dispense:  180 tablet    Refill:  3  . ondansetron (ZOFRAN ODT) 4 MG disintegrating tablet    Sig: Take 1 tablet (4 mg total) by mouth every 8 (eight) hours as needed for nausea or vomiting.    Dispense:  20 tablet    Refill:  2  . tamsulosin (FLOMAX) 0.4 MG CAPS capsule    Sig: Take 1 capsule (0.4 mg total) by mouth daily.    Dispense:  90 capsule    Refill:  3  . atorvastatin (LIPITOR) 40 MG tablet    Sig: Take 1 tablet (40 mg total) by mouth daily.    Dispense:  90 tablet    Refill:  3  . acetaminophen (TYLENOL) 500 MG tablet    Sig: Take 1 tablet (500 mg total) by mouth in the morning and at bedtime.    Dispense:  60 tablet    Refill:  11  . gabapentin (NEURONTIN) 300 MG capsule    Sig: Take 1 capsule (300 mg total) by mouth 2 (two) times daily.    Dispense:  180 capsule    Refill:  3  . glipiZIDE (GLUCOTROL) 10 MG tablet    Sig: Take 1 tablet (10 mg total) by mouth 2 (two) times daily before a meal.    Dispense:  180 tablet    Refill:  3  . methocarbamol (ROBAXIN) 750 MG tablet    Sig: Take 1 tablet (750 mg total) by mouth 2 (two) times daily.    Dispense:  60 tablet    Refill:  11    Follow-up: Return in about 6 months (around 07/24/2020).    Azzie Glatter, FNP

## 2020-01-26 LAB — LIPID PANEL
Chol/HDL Ratio: 3.7 ratio (ref 0.0–5.0)
Cholesterol, Total: 224 mg/dL — ABNORMAL HIGH (ref 100–199)
HDL: 60 mg/dL (ref >39)
LDL Chol Calc (NIH): 149 mg/dL — ABNORMAL HIGH (ref 0–99)
Triglycerides: 85 mg/dL (ref 0–149)
VLDL Cholesterol Cal: 15 mg/dL (ref 5–40)

## 2020-01-26 LAB — VITAMIN B12: Vitamin B-12: 1480 pg/mL — ABNORMAL HIGH (ref 232–1245)

## 2020-01-26 LAB — TSH: TSH: 1.17 u[IU]/mL (ref 0.450–4.500)

## 2020-01-26 LAB — PSA: Prostate Specific Ag, Serum: 5.4 ng/mL — ABNORMAL HIGH (ref 0.0–4.0)

## 2020-01-26 LAB — VITAMIN D 25 HYDROXY (VIT D DEFICIENCY, FRACTURES): Vit D, 25-Hydroxy: 33.8 ng/mL (ref 30.0–100.0)

## 2020-01-30 ENCOUNTER — Encounter: Payer: Self-pay | Admitting: Family Medicine

## 2020-02-01 ENCOUNTER — Telehealth: Payer: Self-pay | Admitting: Family Medicine

## 2020-02-01 NOTE — Telephone Encounter (Signed)
Attempted to contact patient today. Message left on patient's voicemail to contact office to review recent lab results.

## 2020-04-20 MED FILL — LOSARTAN POTASSIUM 50 MG TA: 50 | 90 days supply | Qty: 180 | Fill #1

## 2020-04-20 MED FILL — ONDANSETRON ODT 4 MG TABLET: 4 | 6 days supply | Qty: 20 | Fill #1

## 2020-04-20 MED FILL — ATORVASTATIN CALCIUM 40 MG: 40 | 90 days supply | Qty: 90 | Fill #1

## 2020-04-20 MED FILL — glipiZIDE 10 MG TABS: 10 | 90 days supply | Qty: 180 | Fill #1

## 2020-04-20 MED FILL — METOPROLOL TARTRATE 25 MG T: 25 | 90 days supply | Qty: 180 | Fill #1

## 2020-04-20 MED FILL — METHOCARBAMOL 750 MG TABS: 750 | 30 days supply | Qty: 60 | Fill #1

## 2020-04-20 MED FILL — TAMSULOSIN HCL 0.4 MG CAP: 0.4 | 90 days supply | Qty: 90 | Fill #1

## 2020-04-20 MED FILL — GABAPENTIN 300 MG CAPSULE: 300 | 90 days supply | Qty: 180 | Fill #1

## 2020-04-28 MED FILL — METHOCARBAMOL 750 MG TABS: 750 | 30 days supply | Qty: 60 | Fill #1

## 2020-04-28 MED FILL — TAMSULOSIN HCL 0.4 MG CAP: 0.4 | 90 days supply | Qty: 90 | Fill #1

## 2020-04-28 MED FILL — LOSARTAN POTASSIUM 50 MG TA: 50 | 90 days supply | Qty: 180 | Fill #1

## 2020-04-28 MED FILL — ATORVASTATIN CALCIUM 40 MG: 40 | 90 days supply | Qty: 90 | Fill #1

## 2020-04-28 MED FILL — glipiZIDE 10 MG TABS: 10 | 90 days supply | Qty: 180 | Fill #1

## 2020-04-28 MED FILL — METOPROLOL TARTRATE 25 MG T: 25 | 90 days supply | Qty: 180 | Fill #1

## 2020-04-28 MED FILL — GABAPENTIN 300 MG CAPSULE: 300 | 90 days supply | Qty: 180 | Fill #1

## 2020-05-30 ENCOUNTER — Encounter (HOSPITAL_COMMUNITY): Payer: Self-pay

## 2020-05-30 ENCOUNTER — Emergency Department (HOSPITAL_COMMUNITY)
Admission: EM | Admit: 2020-05-30 | Discharge: 2020-05-30 | Disposition: A | Discharge status: Home / Self Care | Payer: No Typology Code available for payment source | Attending: Emergency Medicine | Admitting: Emergency Medicine

## 2020-05-30 ENCOUNTER — Emergency Department (HOSPITAL_COMMUNITY): Payer: No Typology Code available for payment source

## 2020-05-30 ENCOUNTER — Other Ambulatory Visit: Payer: Self-pay

## 2020-05-30 DIAGNOSIS — N183 Chronic kidney disease, stage 3 unspecified: Secondary | ICD-10-CM | POA: Insufficient documentation

## 2020-05-30 DIAGNOSIS — Z85528 Personal history of other malignant neoplasm of kidney: Secondary | ICD-10-CM | POA: Insufficient documentation

## 2020-05-30 DIAGNOSIS — M25571 Pain in right ankle and joints of right foot: Secondary | ICD-10-CM | POA: Diagnosis not present

## 2020-05-30 DIAGNOSIS — R0781 Pleurodynia: Secondary | ICD-10-CM | POA: Insufficient documentation

## 2020-05-30 DIAGNOSIS — Z79899 Other long term (current) drug therapy: Secondary | ICD-10-CM | POA: Insufficient documentation

## 2020-05-30 DIAGNOSIS — S161XXA Strain of muscle, fascia and tendon at neck level, initial encounter: Secondary | ICD-10-CM | POA: Insufficient documentation

## 2020-05-30 DIAGNOSIS — M25572 Pain in left ankle and joints of left foot: Secondary | ICD-10-CM | POA: Insufficient documentation

## 2020-05-30 DIAGNOSIS — S8992XA Unspecified injury of left lower leg, initial encounter: Secondary | ICD-10-CM | POA: Diagnosis present

## 2020-05-30 DIAGNOSIS — E1122 Type 2 diabetes mellitus with diabetic chronic kidney disease: Secondary | ICD-10-CM | POA: Insufficient documentation

## 2020-05-30 DIAGNOSIS — M549 Dorsalgia, unspecified: Secondary | ICD-10-CM | POA: Diagnosis not present

## 2020-05-30 DIAGNOSIS — Y92410 Unspecified street and highway as the place of occurrence of the external cause: Secondary | ICD-10-CM | POA: Diagnosis not present

## 2020-05-30 DIAGNOSIS — M25512 Pain in left shoulder: Secondary | ICD-10-CM | POA: Diagnosis not present

## 2020-05-30 DIAGNOSIS — S8255XA Nondisplaced fracture of medial malleolus of left tibia, initial encounter for closed fracture: Secondary | ICD-10-CM | POA: Insufficient documentation

## 2020-05-30 DIAGNOSIS — Z7984 Long term (current) use of oral hypoglycemic drugs: Secondary | ICD-10-CM | POA: Diagnosis not present

## 2020-05-30 DIAGNOSIS — I129 Hypertensive chronic kidney disease with stage 1 through stage 4 chronic kidney disease, or unspecified chronic kidney disease: Secondary | ICD-10-CM | POA: Insufficient documentation

## 2020-05-30 DIAGNOSIS — R519 Headache, unspecified: Secondary | ICD-10-CM | POA: Insufficient documentation

## 2020-05-30 LAB — CBG MONITORING, ED: Glucose-Capillary: 76 mg/dL (ref 70–99)

## 2020-05-30 MED ORDER — METHOCARBAMOL 500 MG PO TABS: 500.0000 mg | ORAL_TABLET | Freq: Two times a day (BID) | ORAL | 0 refills | Status: DC

## 2020-05-30 MED ORDER — OXYCODONE-ACETAMINOPHEN 5-325 MG PO TABS
1.0000 | ORAL_TABLET | Freq: Once | ORAL | Status: AC
  Administered 2020-05-30: 1 via ORAL
  Filled 2020-05-30: qty 1

## 2020-05-30 MED ORDER — OXYCODONE-ACETAMINOPHEN 5-325 MG PO TABS: 1.0000 | ORAL_TABLET | Freq: Three times a day (TID) | ORAL | 0 refills | Status: DC | PRN
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
## 2020-05-30 NOTE — ED Notes (Addendum)
An After Visit Summary was printed and given to the patient. Discharge instructions given and no further questions at this time.  Pt states a friend is taking him home.

## 2020-05-30 NOTE — ED Notes (Signed)
Pt called out to say that he was feeling hungry and is a diabetic. CBG assessed.
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
## 2020-05-31 MED FILL — LOSARTAN POTASSIUM 50 MG TA: 50 | 90 days supply | Qty: 180 | Fill #1

## 2020-05-31 MED FILL — METOPROLOL TARTRATE 25 MG T: 25 | 90 days supply | Qty: 180 | Fill #1

## 2020-05-31 MED FILL — GABAPENTIN 300 MG CAPSULE: 300 | 90 days supply | Qty: 180 | Fill #1

## 2020-05-31 MED FILL — glipiZIDE 10 MG TABS: 10 | 90 days supply | Qty: 180 | Fill #1

## 2020-07-20 ENCOUNTER — Emergency Department (HOSPITAL_COMMUNITY): Payer: Self-pay

## 2020-07-20 ENCOUNTER — Emergency Department (HOSPITAL_COMMUNITY)
Admission: EM | Admit: 2020-07-20 | Discharge: 2020-07-20 | Disposition: A | Discharge status: Home / Self Care | Payer: Self-pay | Attending: Emergency Medicine | Admitting: Emergency Medicine

## 2020-07-20 ENCOUNTER — Encounter (HOSPITAL_COMMUNITY): Payer: Self-pay

## 2020-07-20 ENCOUNTER — Other Ambulatory Visit: Payer: Self-pay

## 2020-07-20 DIAGNOSIS — Z79899 Other long term (current) drug therapy: Secondary | ICD-10-CM | POA: Insufficient documentation

## 2020-07-20 DIAGNOSIS — Z7984 Long term (current) use of oral hypoglycemic drugs: Secondary | ICD-10-CM | POA: Insufficient documentation

## 2020-07-20 DIAGNOSIS — R519 Headache, unspecified: Secondary | ICD-10-CM | POA: Insufficient documentation

## 2020-07-20 DIAGNOSIS — R112 Nausea with vomiting, unspecified: Secondary | ICD-10-CM | POA: Insufficient documentation

## 2020-07-20 DIAGNOSIS — Z85528 Personal history of other malignant neoplasm of kidney: Secondary | ICD-10-CM | POA: Insufficient documentation

## 2020-07-20 DIAGNOSIS — R630 Anorexia: Secondary | ICD-10-CM | POA: Insufficient documentation

## 2020-07-20 DIAGNOSIS — E1122 Type 2 diabetes mellitus with diabetic chronic kidney disease: Secondary | ICD-10-CM | POA: Insufficient documentation

## 2020-07-20 DIAGNOSIS — I129 Hypertensive chronic kidney disease with stage 1 through stage 4 chronic kidney disease, or unspecified chronic kidney disease: Secondary | ICD-10-CM | POA: Insufficient documentation

## 2020-07-20 DIAGNOSIS — R1084 Generalized abdominal pain: Secondary | ICD-10-CM | POA: Insufficient documentation

## 2020-07-20 DIAGNOSIS — N183 Chronic kidney disease, stage 3 unspecified: Secondary | ICD-10-CM | POA: Insufficient documentation

## 2020-07-20 LAB — COMPREHENSIVE METABOLIC PANEL
ALT: 42 U/L (ref 0–44)
AST: 22 U/L (ref 15–41)
Albumin: 4 g/dL (ref 3.5–5.0)
Alkaline Phosphatase: 50 U/L (ref 38–126)
Anion gap: 7 (ref 5–15)
BUN: 23 mg/dL (ref 8–23)
CO2: 25 mmol/L (ref 22–32)
Calcium: 9.7 mg/dL (ref 8.9–10.3)
Chloride: 104 mmol/L (ref 98–111)
Creatinine, Ser: 1.44 mg/dL — ABNORMAL HIGH (ref 0.61–1.24)
GFR, Estimated: 55 mL/min — ABNORMAL LOW (ref >60)
Glucose, Bld: 177 mg/dL — ABNORMAL HIGH (ref 70–99)
Potassium: 3.9 mmol/L (ref 3.5–5.1)
Sodium: 136 mmol/L (ref 135–145)
Total Bilirubin: 0.7 mg/dL (ref 0.3–1.2)
Total Protein: 8 g/dL (ref 6.5–8.1)

## 2020-07-20 LAB — CBC WITH DIFFERENTIAL/PLATELET
Abs Immature Granulocytes: 0.03 10*3/uL (ref 0.00–0.07)
Basophils Absolute: 0 10*3/uL (ref 0.0–0.1)
Basophils Relative: 0 %
Eosinophils Absolute: 0 10*3/uL (ref 0.0–0.5)
Eosinophils Relative: 0 %
HCT: 44.9 % (ref 39.0–52.0)
Hemoglobin: 15 g/dL (ref 13.0–17.0)
Immature Granulocytes: 0 %
Lymphocytes Relative: 21 %
Lymphs Abs: 2 10*3/uL (ref 0.7–4.0)
MCH: 27.7 pg (ref 26.0–34.0)
MCHC: 33.4 g/dL (ref 30.0–36.0)
MCV: 82.8 fL (ref 80.0–100.0)
Monocytes Absolute: 0.7 10*3/uL (ref 0.1–1.0)
Monocytes Relative: 7 %
Neutro Abs: 7 10*3/uL (ref 1.7–7.7)
Neutrophils Relative %: 72 %
Platelets: 178 10*3/uL (ref 150–400)
RBC: 5.42 MIL/uL (ref 4.22–5.81)
RDW: 14.5 % (ref 11.5–15.5)
WBC: 9.8 10*3/uL (ref 4.0–10.5)
nRBC: 0 % (ref 0.0–0.2)

## 2020-07-20 LAB — URINALYSIS, ROUTINE W REFLEX MICROSCOPIC
Bacteria, UA: NONE SEEN
Bilirubin Urine: NEGATIVE
Glucose, UA: NEGATIVE mg/dL
Hgb urine dipstick: NEGATIVE
Ketones, ur: NEGATIVE mg/dL
Leukocytes,Ua: NEGATIVE
Nitrite: NEGATIVE
Protein, ur: 100 mg/dL — AB
Specific Gravity, Urine: 1.015 (ref 1.005–1.030)
pH: 6 (ref 5.0–8.0)

## 2020-07-20 LAB — LIPASE, BLOOD: Lipase: 62 U/L — ABNORMAL HIGH (ref 11–51)

## 2020-07-20 MED ORDER — ACETAMINOPHEN 325 MG PO TABS
650.0000 mg | ORAL_TABLET | Freq: Once | ORAL | Status: AC
  Administered 2020-07-20: 650 mg via ORAL
  Filled 2020-07-20: qty 2

## 2020-07-20 MED ORDER — ONDANSETRON 4 MG PO TBDP
4.0000 mg | ORAL_TABLET | Freq: Once | ORAL | Status: AC
  Administered 2020-07-20: 4 mg via ORAL
  Filled 2020-07-20: qty 1

## 2020-07-20 MED ORDER — SODIUM CHLORIDE 0.9 % IV BOLUS
1000.0000 mL | Freq: Once | INTRAVENOUS | Status: AC
  Administered 2020-07-20: 1000 mL via INTRAVENOUS

## 2020-07-20 NOTE — Discharge Instructions (Addendum)
There were no serious problems found on the testing today.  Start with clear liquids and gradually advance to regular foods as tolerated.  Use Tylenol if needed for pain.  Make sure you take all of your medicines as prescribed.  Follow-up with your doctor in a week or so for checkup.

## 2020-07-20 NOTE — ED Notes (Signed)
Pt in CT at this time.

## 2020-07-20 NOTE — ED Provider Notes (Signed)
Emergency Medicine Provider Triage Evaluation Note  Jon Harper , a 65 y.o. male  was evaluated in triage.  Pt complains of nausea, vomiting, abdominal pain, headache.  It was gradual in onset, progressively worse, has been constant since yesterday.  Patient has vomited twice in the last 24 hours.  Emesis is stomach contents, no blood or coffee-ground emesis noted.  Denies any alcohol use or regular ibuprofen use.  Patient reports that he had kidney removed.  Patient denies any other abdominal surgeries.  Review of Systems  Positive: Fevers, chills, nausea, vomiting, headache, abdominal pain Negative: Constipation, diarrhea melena, blood in stool dysuria, hematuria, weakness to extremities, numbness to extremities, facial asymmetry, slurred speech  Physical Exam  BP (!) 161/97 (BP Location: Right Arm)   Pulse 81   Temp 99 F (37.2 C) (Oral)   Resp 20   Ht 5\' 11"  (1.803 m)   Wt 108 kg   SpO2 97%   BMI 33.21 kg/m  Gen:   Awake, no distress   Resp:  Normal effort  MSK:   Moves extremities without difficulty, grip strength equal patient able to stand and ambulate without difficulty Other:  EOM intact, abdomen soft, nondistended, generalized tenderness throughout abdomen.  Medical Decision Making  Medically screening exam initiated at 3:24 PM.  Appropriate orders placed.  Cloretta Ned Aggarwal was informed that the remainder of the evaluation will be completed by another provider, this initial triage assessment does not replace that evaluation, and the importance of remaining in the ED until their evaluation is complete.  The patient appears stable so that the remainder of the work up may be completed by another provider.      Dyann Ruddle 07/20/20 1532    Carmin Muskrat, MD 07/20/20 Bosie Helper

## 2020-07-20 NOTE — ED Provider Notes (Signed)
[facility] DEPT Provider Note   CSN: [id number] Arrival date & time: 07/20/20  1509     History Chief Complaint  Patient presents with  . Abdominal Pain  . Headache  . Emesis    Jon Harper is a 65 y.o. male.  HPI He presents for evaluation of nausea, vomiting and abdominal pain with headache.  He has been sick since yesterday.  He has not seen any blood in his emesis.  No history of hypertension, chronic kidney disease and insulin-dependent diabetes.  Patient states he has been sick for about 2 days.  He has a poor appetite.  He is not vomiting.  He has pain in his central abdomen that is persistent for 2 days.  He denies dysuria, urinary frequency, constipation or diarrhea.  He complains of a mild generalized headache.  He has not been vomiting.  He denies fever, chills, cough or chest pain.  States he is taking his usual medications.  No known sick exposures.  There are no other known modifying factors.    Past Medical History:  Diagnosis Date  . Chronic low back pain   . Diabetes mellitus   . Hypertension   . Neck pain   . Neck pain   . Skin rash 10/2018  . Tooth abscess 10/2018    Patient Active Problem List   Diagnosis Date Noted  . Renal mass, right 07/20/2019  . Hemoglobin A1c less than 7.0% 02/04/2019  . Proteinuria 02/04/2019  . Chronic joint pain 02/04/2019  . Overgrown toenails 02/04/2019  . Tooth abscess 11/03/2018  . Tooth pain 11/03/2018  . Skin rash 11/03/2018  . Chronic bilateral low back pain with bilateral sciatica 09/30/2018  . Prediabetes 09/30/2018  . PAC (premature atrial contraction) 07/07/2017  . Type 2 diabetes mellitus without complication, without long-term current use of insulin ([place]) 04/16/2017  . Abnormal EKG 04/16/2017  . Essential hypertension 04/16/2017  . Hyperlipidemia associated with type 2 diabetes mellitus ([place]) 04/16/2017  . Hx of renal cell cancer 04/16/2017  . Stage 3 chronic kidney disease  ([place]) 04/16/2017  . History of unilateral nephrectomy 04/16/2017    Past Surgical History:  Procedure Laterality Date  . right nephrectomy         Family History  Problem Relation Age of Onset  . Diabetes Mother        non-insulin   . Diabetes Father        non-insulin   . Diabetes Sister        non-insulin     Social History   Tobacco Use  . Smoking status: Never Smoker  . Smokeless tobacco: Never Used  Vaping Use  . Vaping Use: Never used  Substance Use Topics  . Alcohol use: No  . Drug use: No    Home Medications Prior to Admission medications   Medication Sig Start Date End Date Taking? Authorizing Provider  acetaminophen (TYLENOL) 500 MG tablet Take 1 tablet (500 mg total) by mouth in the morning and at bedtime. 01/25/20   Azzie Glatter, FNP  atorvastatin (LIPITOR) 40 MG tablet TAKE 1 TABLET (40 MG TOTAL) BY MOUTH DAILY. 01/25/20 01/24/21  Azzie Glatter, FNP  clotrimazole-betamethasone (LOTRISONE) cream Apply 1 application topically 2 (two) times daily. Patient not taking: Reported on 01/25/2020 11/02/18   Azzie Glatter, FNP  gabapentin (NEURONTIN) 300 MG capsule TAKE 1 CAPSULE (300 MG TOTAL) BY MOUTH 2 (TWO) TIMES DAILY. 01/25/20 01/24/21  Azzie Glatter, FNP  glipiZIDE (  GLUCOTROL) 10 MG tablet TAKE 1 TABLET (10 MG TOTAL) BY MOUTH 2 (TWO) TIMES DAILY BEFORE A MEAL. 01/25/20 01/24/21  Azzie Glatter, FNP  hydrocortisone cream 0.5 % Apply 1 application topically 2 (two) times daily. Patient not taking: Reported on 01/25/2020 10/26/18   Azzie Glatter, FNP  losartan (COZAAR) 50 MG tablet TAKE 2 TABLETS (100 MG TOTAL) BY MOUTH DAILY. 01/25/20 01/24/21  Azzie Glatter, FNP  methocarbamol (ROBAXIN) 500 MG tablet Take 1 tablet (500 mg total) by mouth 2 (two) times daily. 05/30/20   Khatri, Hina, PA-C  metoprolol tartrate (LOPRESSOR) 25 MG tablet TAKE 1 TABLET (25 MG TOTAL) BY MOUTH 2 (TWO) TIMES DAILY. 01/25/20 01/24/21  Azzie Glatter, FNP   ondansetron (ZOFRAN-ODT) 4 MG disintegrating tablet TAKE 1 TABLET (4 MG TOTAL) BY MOUTH EVERY 8 (EIGHT) HOURS AS NEEDED FOR NAUSEA OR VOMITING. 01/25/20 01/24/21  Azzie Glatter, FNP  oxyCODONE-acetaminophen (PERCOCET/ROXICET) 5-325 MG tablet Take 1 tablet by mouth every 8 (eight) hours as needed for severe pain. 05/30/20   Khatri, Hina, PA-C  tamsulosin (FLOMAX) 0.4 MG CAPS capsule TAKE 1 CAPSULE (0.4 MG TOTAL) BY MOUTH DAILY. 01/25/20 01/24/21  Azzie Glatter, FNP    Allergies    Patient has no known allergies.  Review of Systems   Review of Systems  All other systems reviewed and are negative.   Physical Exam Updated Vital Signs BP (!) 161/97 (BP Location: Right Arm)   Pulse 81   Temp 99 F (37.2 C) (Oral)   Resp 20   Ht 5\' 11"  (1.803 m)   Wt 108 kg   SpO2 97%   BMI 33.21 kg/m   Physical Exam Vitals and nursing note reviewed.  Constitutional:      General: He is not in acute distress.    Appearance: He is well-developed. He is obese. He is not ill-appearing, toxic-appearing or diaphoretic.  HENT:     Head: Normocephalic and atraumatic.     Right Ear: External ear normal.     Left Ear: External ear normal.  Eyes:     Conjunctiva/sclera: Conjunctivae normal.     Pupils: Pupils are equal, round, and reactive to light.  Neck:     Trachea: Phonation normal.  Cardiovascular:     Rate and Rhythm: Normal rate and regular rhythm.     Heart sounds: Normal heart sounds.  Pulmonary:     Effort: Pulmonary effort is normal.     Breath sounds: Normal breath sounds.  Abdominal:     General: There is no distension.     Palpations: Abdomen is soft. There is no mass.     Tenderness: There is abdominal tenderness (Central abdomen, diffuse, mild). There is no guarding.     Hernia: No hernia is present.  Musculoskeletal:        General: Normal range of motion.     Cervical back: Normal range of motion and neck supple.  Skin:    General: Skin is warm and dry.  Neurological:      Mental Status: He is alert and oriented to person, place, and time.     Cranial Nerves: No cranial nerve deficit.     Sensory: No sensory deficit.     Motor: No abnormal muscle tone.     Coordination: Coordination normal.  Psychiatric:        Mood and Affect: Mood normal.        Behavior: Behavior normal.        Thought  Content: Thought content normal.        Judgment: Judgment normal.     ED Results / Procedures / Treatments   Labs (all labs ordered are listed, but only abnormal results are displayed) Labs Reviewed  CBC WITH DIFFERENTIAL/PLATELET  COMPREHENSIVE METABOLIC PANEL  LIPASE, BLOOD  URINALYSIS, ROUTINE W REFLEX MICROSCOPIC    EKG None  Radiology No results found.  Procedures Procedures   Medications Ordered in ED Medications  acetaminophen (TYLENOL) tablet 650 mg (650 mg Oral Given 07/20/20 1547)  ondansetron (ZOFRAN-ODT) disintegrating tablet 4 mg (4 mg Oral Given 07/20/20 1544)    ED Course  I have reviewed the triage vital signs and the nursing notes.  Pertinent labs & imaging results that were available during my care of the patient were reviewed by me and considered in my medical decision making (see chart for details).    MDM Rules/Calculators/A&P                           Patient Vitals for the past 24 hrs:  BP Temp Temp src Pulse Resp SpO2 Height Weight  07/20/20 1638 (!) 156/93 -- -- -- 18 97 % -- --  07/20/20 1520 -- -- -- -- -- -- 5\' 11"  (1.803 m) 108 kg  07/20/20 1516 (!) 161/97 99 F (37.2 C) Oral 81 20 97 % -- --    7:32 PM Reevaluation with update and discussion. After initial assessment and treatment, an updated evaluation reveals he remains comfortable has no additional complaints I discussed the findings with him.  Low level elevation of lipase may indicate some pancreatic inflammation causing discomfort.  He has mild hypertension and states he is taking his usual medications.  Findings discussed with the patient and all  questions were answered. Daleen Bo   Medical Decision Making:  This patient is presenting for evaluation of nonspecific symptoms, which does require a range of treatment options, and is a complaint that involves a moderate risk of morbidity and mortality. The differential diagnoses include gastritis, intestinal disorder such as diverticulitis, UTI, dehydration. I decided to review old records, and in summary elderly male presenting with nonspecific symptoms including nausea, vomiting and abdominal pain.  He is an insulin-dependent diabetic..  I did not require additional historical information from anyone.  Clinical Laboratory Tests Ordered, included CBC, Metabolic panel, Urinalysis and Lipase. Review indicates normal except protein in urine, lipase elevated, glucose high, creatinine high. Radiologic Tests Ordered, included CT abdomen pelvis.  I independently Visualized: Radiographic images, which show no acute abnormalities    Critical Interventions-clinical evaluation, laboratory testing, CT imaging, observation and reassessment  After These Interventions, the Patient was reevaluated and was found stable for discharge.  Patient with nonspecific abdominal pain and reassuring evaluation.  Possible minimal pancreatic inflammation from nonspecific illness.  Patient is nontoxic.  He has minimal elevation of blood pressure, currently taking medications for same.  Recommend outpatient follow-up with PCP in a week for further evaluation and treatment as needed  CRITICAL CARE-no Performed by: Daleen Bo  Nursing Notes Reviewed/ Care Coordinated Applicable Imaging Reviewed Interpretation of Laboratory Data incorporated into ED treatment  The patient appears reasonably screened and/or stabilized for discharge and I doubt any other medical condition or other [facility] requiring further screening, evaluation, or treatment in the ED at this time prior to discharge.  Plan: Home Medications-continue  usual; Home Treatments-gradual advance diet; return here if the recommended treatment, does not improve the symptoms; Recommended follow  up-PCP 1 week and as needed     Final Clinical Impression(s) / ED Diagnoses Final diagnoses:  Generalized abdominal pain    Rx / DC Orders ED Discharge Orders    None       Daleen Bo, MD 07/21/20 1249

## 2020-07-20 NOTE — ED Triage Notes (Signed)
Generalized abd pain accompined with N/V and headache described as throbbing since waking this am.

## 2020-07-25 ENCOUNTER — Ambulatory Visit: Payer: Self-pay | Admitting: Family Medicine

## 2020-07-26 ENCOUNTER — Ambulatory Visit (INDEPENDENT_AMBULATORY_CARE_PROVIDER_SITE_OTHER): Payer: Worker's Compensation | Admitting: Nurse Practitioner

## 2020-07-26 ENCOUNTER — Other Ambulatory Visit: Payer: Self-pay

## 2020-07-26 ENCOUNTER — Encounter: Payer: Self-pay | Admitting: Nurse Practitioner

## 2020-07-26 VITALS — BP 139/88 | HR 76 | Temp 98.1°F | Ht 71.0 in | Wt 279.1 lb

## 2020-07-26 DIAGNOSIS — E785 Hyperlipidemia, unspecified: Secondary | ICD-10-CM

## 2020-07-26 DIAGNOSIS — S82892D Other fracture of left lower leg, subsequent encounter for closed fracture with routine healing: Secondary | ICD-10-CM

## 2020-07-26 DIAGNOSIS — M5442 Lumbago with sciatica, left side: Secondary | ICD-10-CM | POA: Diagnosis not present

## 2020-07-26 DIAGNOSIS — E119 Type 2 diabetes mellitus without complications: Secondary | ICD-10-CM | POA: Diagnosis not present

## 2020-07-26 DIAGNOSIS — R300 Dysuria: Secondary | ICD-10-CM

## 2020-07-26 DIAGNOSIS — M5441 Lumbago with sciatica, right side: Secondary | ICD-10-CM | POA: Diagnosis not present

## 2020-07-26 DIAGNOSIS — E1169 Type 2 diabetes mellitus with other specified complication: Secondary | ICD-10-CM

## 2020-07-26 DIAGNOSIS — R21 Rash and other nonspecific skin eruption: Secondary | ICD-10-CM | POA: Diagnosis not present

## 2020-07-26 DIAGNOSIS — G8929 Other chronic pain: Secondary | ICD-10-CM

## 2020-07-26 DIAGNOSIS — R972 Elevated prostate specific antigen [PSA]: Secondary | ICD-10-CM

## 2020-07-26 DIAGNOSIS — I10 Essential (primary) hypertension: Secondary | ICD-10-CM | POA: Diagnosis not present

## 2020-07-26 DIAGNOSIS — N400 Enlarged prostate without lower urinary tract symptoms: Secondary | ICD-10-CM

## 2020-07-26 DIAGNOSIS — G629 Polyneuropathy, unspecified: Secondary | ICD-10-CM

## 2020-07-26 LAB — POCT URINALYSIS DIPSTICK
Bilirubin, UA: NEGATIVE
Glucose, UA: NEGATIVE
Ketones, UA: NEGATIVE
Leukocytes, UA: NEGATIVE
Nitrite, UA: NEGATIVE
Protein, UA: POSITIVE — AB
Spec Grav, UA: 1.025 (ref 1.010–1.025)
Urobilinogen, UA: 0.2 E.U./dL
pH, UA: 6 (ref 5.0–8.0)

## 2020-07-26 LAB — POCT GLYCOSYLATED HEMOGLOBIN (HGB A1C)
HbA1c POC (<> result, manual entry): 6.3 % (ref 4.0–5.6)
HbA1c, POC (controlled diabetic range): 6.3 % (ref 0.0–7.0)
HbA1c, POC (prediabetic range): 6.3 % (ref 5.7–6.4)
Hemoglobin A1C: 6.3 % — AB (ref 4.0–5.6)

## 2020-07-26 LAB — GLUCOSE, POCT (MANUAL RESULT ENTRY): POC Glucose: 131 mg/dl — AB (ref 70–99)

## 2020-07-26 MED ORDER — METHOCARBAMOL 500 MG PO TABS
500.0000 mg | ORAL_TABLET | Freq: Two times a day (BID) | ORAL | 0 refills | Status: DC
  Filled 2020-07-26: qty 20, 10d supply, fill #0

## 2020-07-26 MED ORDER — KETOROLAC TROMETHAMINE 60 MG/2ML IM SOLN
60.0000 mg | Freq: Once | INTRAMUSCULAR | Status: AC
  Administered 2020-07-26: 60 mg via INTRAMUSCULAR

## 2020-07-26 MED ORDER — ATORVASTATIN CALCIUM 40 MG PO TABS
ORAL_TABLET | Freq: Every day | ORAL | 3 refills | Status: DC
  Filled 2020-07-26: qty 30, 30d supply, fill #0
  Filled 2020-10-25: qty 30, 30d supply, fill #1
  Filled 2020-11-23: qty 30, 30d supply, fill #2
  Filled 2020-12-26: qty 30, 30d supply, fill #3
  Filled 2021-01-26: qty 90, 90d supply, fill #4
  Filled 2021-05-01: qty 30, 30d supply, fill #0
  Filled 2021-05-01: qty 90, 90d supply, fill #5
  Filled 2021-05-24: qty 30, 30d supply, fill #1
  Filled 2021-05-25: qty 90, 90d supply, fill #1

## 2020-07-26 MED ORDER — METOPROLOL TARTRATE 25 MG PO TABS
ORAL_TABLET | Freq: Two times a day (BID) | ORAL | 3 refills | Status: DC
  Filled 2020-07-26: qty 60, 30d supply, fill #0
  Filled 2020-10-25: qty 60, 30d supply, fill #1
  Filled 2020-12-21: qty 60, 30d supply, fill #2
  Filled 2021-01-16: qty 60, 30d supply, fill #3
  Filled 2021-02-15: qty 180, 90d supply, fill #4

## 2020-07-26 MED ORDER — TAMSULOSIN HCL 0.4 MG PO CAPS
0.8000 mg | ORAL_CAPSULE | Freq: Every day | ORAL | 3 refills | Status: AC
  Filled 2020-07-26: qty 60, 30d supply, fill #0

## 2020-07-26 MED ORDER — GLIPIZIDE 10 MG PO TABS
ORAL_TABLET | Freq: Two times a day (BID) | ORAL | 3 refills | Status: DC
  Filled 2020-07-26: qty 60, 30d supply, fill #0
  Filled 2020-10-25: qty 60, 30d supply, fill #1
  Filled 2020-12-12: qty 60, 30d supply, fill #2
  Filled 2021-01-16: qty 60, 30d supply, fill #3
  Filled 2021-02-15: qty 180, 90d supply, fill #4
  Filled 2021-05-24: qty 180, 90d supply, fill #5
  Filled 2021-05-25: qty 180, 90d supply, fill #0

## 2020-07-26 MED ORDER — LOSARTAN POTASSIUM 50 MG PO TABS
ORAL_TABLET | ORAL | 3 refills | Status: DC
  Filled 2020-07-26: qty 60, 30d supply, fill #0
  Filled 2020-10-25: qty 60, 30d supply, fill #1
  Filled 2020-12-12: qty 60, 30d supply, fill #2
  Filled 2021-01-16: qty 60, 30d supply, fill #3
  Filled 2021-02-20 – 2021-02-21 (×2): qty 180, 90d supply, fill #4
  Filled 2021-05-24: qty 180, 90d supply, fill #5
  Filled 2021-05-25: qty 180, 90d supply, fill #0

## 2020-07-26 MED ORDER — GABAPENTIN 300 MG PO CAPS
ORAL_CAPSULE | Freq: Two times a day (BID) | ORAL | 3 refills | Status: DC
  Filled 2020-07-26: qty 60, 30d supply, fill #0
  Filled 2020-10-25 – 2020-12-21 (×2): qty 60, 30d supply, fill #1
  Filled 2021-01-16: qty 60, 30d supply, fill #2
  Filled 2021-02-15: qty 180, 90d supply, fill #3
  Filled 2021-05-24: qty 180, 90d supply, fill #4
  Filled 2021-05-25: qty 180, 90d supply, fill #0

## 2020-07-26 NOTE — Patient Instructions (Signed)
[email or web address]  Managing Chronic Back Pain Chronic back pain is back pain that lasts for 12 weeks or longer. It often affects the lower back. Back pain may feel like a muscle ache or a sharp, stabbing pain. It can be mild, moderate, or severe. If you have been diagnosed with chronic back pain, there are things you can do to manage your symptoms. You may have to try different things to see what works best for you. Your health care provider may also give you specific instructions. How to manage lifestyle changes Treating chronic back pain often starts with rest and pain relief, followed by exercises to restore movement and strength to your back (physical therapy). You may need surgery if other treatments do not help, or if your pain is caused by a condition or an injury. Follow your treatment plan as told by your health care provider. This may include:  Relaxation techniques.  Talk therapy or counseling with a mental health specialist. A form of talk therapy called cognitive behavioral therapy (CBT) can be especially helpful. This therapy helps you set goals and follow up on the changes that you make.  Acupuncture or massage therapy.  Local electrical stimulation.  Injections. These deliver numbing or pain-relieving medicines into your spine or the area of pain. How to recognize changes in your chronic back pain Your condition may improve with treatment. However, back pain may not go away or may get worse over time. Watch your symptoms carefully and let your health care provider know if your symptoms get worse or do not improve. Your back pain may be getting worse if you have:  Pain that begins to cause problems with posture.  Pain that gets worse when you are sitting, standing, walking, bending, or lifting.  Pain that affects you while you are active, or at rest, or both.  Pain that eventually makes it hard to move around (limits mobility).  Pain that occurs with  fever, weight loss, or difficulty urinating.  Pain that causes numbness and tingling. How to use body mechanics and posture to help with pain Healthy body mechanics and good posture can help to relieve stress on your back. Body mechanics refers to the movements and positions of your body during your daily activities. Posture is part of body mechanics. Good posture means:  Your spine is in its natural S-curve, or neutral, position.  Your shoulders are pulled back slightly.  Your head is not tipped forward. Follow these guidelines to improve your posture and body mechanics in your everyday activities. Standing  When standing, keep your spine neutral and your feet about hip-width apart. Keep your knees slightly bent. Your ears, shoulders, and hips should line up.  When you do a task in which you stand in one place for a long time, place one foot on a stable object that is 2-4 inches (5-10 cm) high, such as a footstool. This helps keep your spine neutral.   Sitting  When sitting, keep your spine neutral and your feet flat on the floor. Use a footrest, if necessary, and keep your thighs parallel to the floor. Avoid rounding your shoulders, and avoid tilting your head forward.  When working at a desk or a computer, keep your desk at a height where your hands are slightly lower than your elbows. Slide your chair under your desk so you are close enough to maintain good posture.  When working at a computer, place your monitor at a height where you are looking straight ahead and  you do not have to tilt your head forward or downward to view the screen.   Lifting  Keep your feet at least shoulder-width apart and tighten the muscles of your abdomen.  Bend your knees and hips and keep your spine neutral. Be sure to lift using the strength of your legs, not your back. Do not lock your knees straight out.  Always ask for help to lift heavy or awkward objects.   Resting  When lying down and resting,  avoid positions that are most painful.  If you have pain with activities such as sitting, bending, stooping, or squatting, lie in a position in which your body does not bend very much. For example, avoid curling up on your side with your arms and knees near your chest (fetal position).  If you have pain with activities such as standing for a long time or reaching with your arms, lie with your spine in a neutral position and bend your knees slightly. Try: ? Lying on your side with a pillow between your knees. ? Lying on your back with a pillow under your knees.   Follow these instructions at home: Medicines  Treatment may include over-the-counter or prescription medicines for pain and inflammation that are taken by mouth or applied to the skin. Another treatment may include muscle relaxants. Take over-the-counter and prescription medicines only as told by your health care provider.  Ask your health care provider if the medicine prescribed to you: ? Requires you to avoid driving or using machinery. ? Can cause constipation. You may need to take these actions to prevent or treat constipation:  Drink enough fluid to keep your urine pale yellow.  Take over-the-counter or prescription medicines.  Eat foods that are high in fiber, such as beans, whole grains, and fresh fruits and vegetables.  Limit foods that are high in fat and processed sugars, such as fried or sweet foods. Lifestyle  Do not use any products that contain nicotine or tobacco, such as cigarettes, e-cigarettes, and chewing tobacco. If you need help quitting, ask your health care provider.  Eat a healthy diet that includes foods such as vegetables, fruits, fish, and lean meats.  Work with your health care provider to achieve or maintain a healthy weight. General instructions  Get regular exercise as told. Exercise improves flexibility and strength.  If physical therapy was prescribed, do exercises as told by your health care  provider.  Use ice or heat therapy as told by your health care provider.  Keep all follow-up visits as told by your health care provider. This is important. Where can I get support? Consider joining a support group for people managing chronic back pain. Ask your health care provider about support groups in your area. You can also find online and in-person support groups through:  The American Chronic Pain Association: theacpa.org  Pain Connection Program: painconnection.org Contact a health care provider if:  You have pain that is not relieved with rest or medicine.  Your pain gets worse, or you have new pain.  You have a fever.  You have rapid weight loss.  You have trouble doing your normal activities. Get help right away if:  You have weakness or numbness in one or both of your legs or feet.  You have trouble controlling your bladder or your bowels.  You have severe back pain and have any of the following: ? Nausea or vomiting. ? Abdominal pain. ? Shortness of breath or you faint. Summary  Chronic back  pain is often treated with rest, pain relief, and physical therapy.  Talk therapy, acupuncture, massage, and local electrical stimulation may help.  Follow your treatment plan as told by your health care provider.  Joining a support group may help you manage chronic back pain. This information is not intended to replace advice given to you by your health care provider. Make sure you discuss any questions you have with your health care provider. Document Revised: 04/08/2019 Document Reviewed: 12/15/2018 Elsevier Patient Education  2021 [place].    Ankle Fracture The ankle joint is made up of the lower (distal) sections of the lower leg bones, called the tibia and fibula, along with a bone in the foot called the talus. An ankle fracture is a break in one, two, or all three of these sections of bone. There are two general types of ankle fractures:  Stable  fracture. This happens when one of the bones is broken, but the bones of the ankle joint stay in their normal positions.  Unstable fracture. This type can include more than one broken bone. It can also happen if the outer bone is broken and the strong tissues that connect bones to each other (ligaments) are also injured at the inner ankle. This type of fracture allows the talus to move out of its normal position. What are the causes? This condition may be caused by:  A hard, direct hit to the ankle.  Quickly and severely twisting your ankle, often while your foot is planted and the rest of your body is moving.  Trauma, such as from a car crash or a fall from a height. What increases the risk? The following factors may make you more likely to develop this condition:  Being overweight.  Participating in sports that involve quick direction changes, as in soccer.  Doing high-impact sports such as gymnastics or football. What are the signs or symptoms? Symptoms of this condition include:  A tender and swollen ankle.  Bruising around your injured ankle.  Pain when moving or pressing on your ankle.  Trouble walking or using your ankle to support your body weight (putting weight on your ankle).  Pain that gets worse when you move your foot or ankle or when you stand.  Pain that gets better with rest.   How is this diagnosed? An ankle fracture is usually diagnosed with a physical exam and X-rays. You may also have a CT scan or an MRI. How is this treated? Treatment for this condition depends on the type of ankle fracture you have. Stable fractures are treated with a cast, boot, or splint to hold the ankle still and crutches to avoid putting weight on the ankle until the fracture heals. Unstable fractures require surgery to ensure that the bones heal properly. After surgery, you will have a splint. After your incision has healed, your surgeon may give you a cast or a boot. You will not be  able to put weight on your injured side for several weeks. After your ankle has healed, you will do physical therapy exercises to improve movement and strength in your ankle. Follow these instructions at home: If you have a boot or splint:  Wear the boot or splint as told by your health care provider. Remove it only as told by your health care provider.  Loosen it if your toes tingle, become numb, or turn cold and blue.  Keep it clean and dry. If you have a cast:  Do not put pressure on  any part of the cast until it is fully hardened. This may take several hours.  Do not stick anything inside the cast to scratch your skin. Doing that increases your risk of infection.  Check the skin around the cast every day. Tell your health care provider about any concerns.  You may put lotion on dry skin around the edges of the cast. Do not put lotion on the skin underneath the cast.  Keep it clean and dry. Bathing  Do not take baths, swim, or use a hot tub until your health care provider approves. Ask your health care provider if you may take showers. You may only be allowed to take sponge baths.  If the cast, boot, or splint is not waterproof: ? Do not let it get wet. ? Cover it with a watertight covering when you take a bath or shower. Managing pain, stiffness, and swelling  If directed, put ice on the injured area. To do this: ? If you have a removable splint or boot, remove it as told by your health care provider. ? Put ice in a plastic bag. ? Place a towel between your skin and the bag or between your cast and the bag. ? Leave the ice on for 20 minutes, 2-3 times a day. ? Remove the ice if your skin turns bright red. This is very important. If you cannot feel pain, heat, or cold, you have a greater risk of damage to the area.  Move your toes often to reduce stiffness and swelling.  Raise (elevate) the injured area above the level of your heart while you are sitting or lying down.    Activity  Do exercises as told by your health care provider.  Return to your normal activities as told by your health care provider. Ask your health care provider what activities are safe for you.  Do not use the injured limb to support your body weight until your health care provider says that you can. Use crutches as told by your health care provider. General instructions  Take over-the-counter and prescription medicines only as told by your health care provider.  Ask your health care provider when it is safe to drive if you have a cast, boot, or splint on your ankle.  Do not use any products that contain nicotine or tobacco, such as cigarettes, e-cigarettes, and chewing tobacco. These can delay bone healing. If you need help quitting, ask your health care provider.  Keep all follow-up visits. This is important. Contact a health care provider if:  You have pain or swelling that gets worse or does not get better with rest or medicine.  Your cast gets damaged. Get help right away if:  You have severe pain that lasts.  You develop new pain or swelling.  Your skin or toenails below the injury turn blue or gray, feel cold, become numb, or are less sensitive to the touch. Summary  An ankle fracture can be stable or unstable. This is determined after a physical exam and imaging studies such as X-rays, a CT scan, or an MRI.  Stable fractures are treated with a cast, boot, or splint to hold the ankle still until the fracture heals. Unstable fractures require surgery to ensure that the bones heal properly.  You will not be able to put weight on your injured side for several weeks.  Medicines, icing, and raising (elevating) your injured ankle when you are sitting or lying down may help with pain relief. Follow instructions as  told by your health care provider. This information is not intended to replace advice given to you by your health care provider. Make sure you discuss any  questions you have with your health care provider. Document Revised: 05/27/2019 Document Reviewed: 05/27/2019 Elsevier Patient Education  [place].    Diabetes Mellitus and Nutrition, Adult When you have diabetes, or diabetes mellitus, it is very important to have healthy eating habits because your blood sugar (glucose) levels are greatly affected by what you eat and drink. Eating healthy foods in the right amounts, at about the same times every day, can help you:  Control your blood glucose.  Lower your risk of heart disease.  Improve your blood pressure.  Reach or maintain a healthy weight. What can affect my meal plan? Every person with diabetes is different, and each person has different needs for a meal plan. Your health care provider may recommend that you work with a dietitian to make a meal plan that is best for you. Your meal plan may vary depending on factors such as:  The calories you need.  The medicines you take.  Your weight.  Your blood glucose, blood pressure, and cholesterol levels.  Your activity level.  Other health conditions you have, such as heart or kidney disease. How do carbohydrates affect me? Carbohydrates, also called carbs, affect your blood glucose level more than any other type of food. Eating carbs naturally raises the amount of glucose in your blood. Carb counting is a method for keeping track of how many carbs you eat. Counting carbs is important to keep your blood glucose at a healthy level, especially if you use insulin or take certain oral diabetes medicines. It is important to know how many carbs you can safely have in each meal. This is different for every person. Your dietitian can help you calculate how many carbs you should have at each meal and for each snack. How does alcohol affect me? Alcohol can cause a sudden decrease in blood glucose (hypoglycemia), especially if you use insulin or take certain oral diabetes medicines.  Hypoglycemia can be a life-threatening condition. Symptoms of hypoglycemia, such as sleepiness, dizziness, and confusion, are similar to symptoms of having too much alcohol.  Do not drink alcohol if: ? Your health care provider tells you not to drink. ? You are pregnant, may be pregnant, or are planning to become pregnant.  If you drink alcohol: ? Do not drink on an empty stomach. ? Limit how much you use to:  0-1 drink a day for women.  0-2 drinks a day for men. ? Be aware of how much alcohol is in your drink. In the [place] one drink equals one 12 oz bottle of beer (355 mL), one 5 oz glass of wine (148 mL), or one 1 oz glass of hard liquor (44 mL). ? Keep yourself hydrated with water, diet soda, or unsweetened iced tea.  Keep in mind that regular soda, juice, and other mixers may contain a lot of sugar and must be counted as carbs. What are tips for following this plan? Reading food labels  Start by checking the serving size on the "Nutrition Facts" label of packaged foods and drinks. The amount of calories, carbs, fats, and other nutrients listed on the label is based on one serving of the item. Many items contain more than one serving per package.  Check the total grams (g) of carbs in one serving. You can calculate the number of servings of carbs  in one serving by dividing the total carbs by 15. For example, if a food has 30 g of total carbs per serving, it would be equal to 2 servings of carbs.  Check the number of grams (g) of saturated fats and trans fats in one serving. Choose foods that have a low amount or none of these fats.  Check the number of milligrams (mg) of salt (sodium) in one serving. Most people should limit total sodium intake to less than 2,300 mg per day.  Always check the nutrition information of foods labeled as "low-fat" or "nonfat." These foods may be higher in added sugar or refined carbs and should be avoided.  Talk to your dietitian to identify your daily  goals for nutrients listed on the label. Shopping  Avoid buying canned, pre-made, or processed foods. These foods tend to be high in fat, sodium, and added sugar.  Shop around the outside edge of the grocery store. This is where you will most often find fresh fruits and vegetables, bulk grains, fresh meats, and fresh dairy. Cooking  Use low-heat cooking methods, such as baking, instead of high-heat cooking methods like deep frying.  Cook using healthy oils, such as olive, canola, or sunflower oil.  Avoid cooking with butter, cream, or high-fat meats. Meal planning  Eat meals and snacks regularly, preferably at the same times every day. Avoid going long periods of time without eating.  Eat foods that are high in fiber, such as fresh fruits, vegetables, beans, and whole grains. Talk with your dietitian about how many servings of carbs you can eat at each meal.  Eat 4-6 oz (112-168 g) of lean protein each day, such as lean meat, chicken, fish, eggs, or tofu. One ounce (oz) of lean protein is equal to: ? 1 oz (28 g) of meat, chicken, or fish. ? 1 egg. ?  cup (62 g) of tofu.  Eat some foods each day that contain healthy fats, such as avocado, nuts, seeds, and fish.   What foods should I eat? Fruits Berries. Apples. Oranges. Peaches. Apricots. Plums. Grapes. Mango. Papaya. Pomegranate. Kiwi. Cherries. Vegetables Lettuce. Spinach. Leafy greens, including kale, chard, collard greens, and mustard greens. Beets. Cauliflower. Cabbage. Broccoli. Carrots. Green beans. Tomatoes. Peppers. Onions. Cucumbers. Brussels sprouts. Grains Whole grains, such as whole-wheat or whole-grain bread, crackers, tortillas, cereal, and pasta. Unsweetened oatmeal. Quinoa. Brown or wild rice. Meats and other proteins Seafood. Poultry without skin. Lean cuts of poultry and beef. Tofu. Nuts. Seeds. Dairy Low-fat or fat-free dairy products such as milk, yogurt, and cheese. The items listed above may not be a  complete list of foods and beverages you can eat. Contact a dietitian for more information. What foods should I avoid? Fruits Fruits canned with syrup. Vegetables Canned vegetables. Frozen vegetables with butter or cream sauce. Grains Refined white flour and flour products such as bread, pasta, snack foods, and cereals. Avoid all processed foods. Meats and other proteins Fatty cuts of meat. Poultry with skin. Breaded or fried meats. Processed meat. Avoid saturated fats. Dairy Full-fat yogurt, cheese, or milk. Beverages Sweetened drinks, such as soda or iced tea. The items listed above may not be a complete list of foods and beverages you should avoid. Contact a dietitian for more information. Questions to ask a health care provider  Do I need to meet with a diabetes educator?  Do I need to meet with a dietitian?  What number can I call if I have questions?  When are the best  times to check my blood glucose? Where to find more information:  American Diabetes Association: diabetes.org  Academy of Nutrition and Dietetics: www.eatright.[employer] of Diabetes and Digestive and Kidney Diseases: [employer]  Association of Diabetes Care and Education Specialists: www.diabeteseducator.org Summary  It is important to have healthy eating habits because your blood sugar (glucose) levels are greatly affected by what you eat and drink.  A healthy meal plan will help you control your blood glucose and maintain a healthy lifestyle.  Your health care provider may recommend that you work with a dietitian to make a meal plan that is best for you.  Keep in mind that carbohydrates (carbs) and alcohol have immediate effects on your blood glucose levels. It is important to count carbs and to use alcohol carefully. This information is not intended to replace advice given to you by your health care provider. Make sure you discuss any questions you have with your health care  provider. Document Revised: 02/02/2019 Document Reviewed: 02/02/2019 Elsevier Patient Education  2021 [place].   Benign Prostatic Hyperplasia  Benign prostatic hyperplasia (BPH) is an enlarged prostate gland that is caused by the normal aging process and not by cancer. The prostate is a walnut-sized gland that is involved in the production of semen. It is located in front of the rectum and below the bladder. The bladder stores urine and the urethra is the tube that carries the urine out of the body. The prostate may get bigger as a man gets older. An enlarged prostate can press on the urethra. This can make it harder to pass urine. The build-up of urine in the bladder can cause infection. Back pressure and infection may progress to bladder damage and kidney (renal) failure. What are the causes? This condition is part of a normal aging process. However, not all men develop problems from this condition. If the prostate enlarges away from the urethra, urine flow will not be blocked. If it enlarges toward the urethra and compresses it, there will be problems passing urine. What increases the risk? This condition is more likely to develop in men over the age of 44 years. What are the signs or symptoms? Symptoms of this condition include:  Getting up often during the night to urinate.  Needing to urinate frequently during the day.  Difficulty starting urine flow.  Decrease in size and strength of your urine stream.  Leaking (dribbling) after urinating.  Inability to pass urine. This needs immediate treatment.  Inability to completely empty your bladder.  Pain when you pass urine. This is more common if there is also an infection.  Urinary tract infection (UTI). How is this diagnosed? This condition is diagnosed based on your medical history, a physical exam, and your symptoms. Tests will also be done, such as:  A post-void bladder scan. This measures any amount of urine that may  remain in your bladder after you finish urinating.  A digital rectal exam. In a rectal exam, your health care provider checks your prostate by putting a lubricated, gloved finger into your rectum to feel the back of your prostate gland. This exam detects the size of your gland and any abnormal lumps or growths.  An exam of your urine (urinalysis).  A prostate specific antigen (PSA) screening. This is a blood test used to screen for prostate cancer.  An ultrasound. This test uses sound waves to electronically produce a picture of your prostate gland. Your health care provider may refer you to a  specialist in kidney and prostate diseases (urologist). How is this treated? Once symptoms begin, your health care provider will monitor your condition (active surveillance or watchful waiting). Treatment for this condition will depend on the severity of your condition. Treatment may include:  Observation and yearly exams. This may be the only treatment needed if your condition and symptoms are mild.  Medicines to relieve your symptoms, including: ? Medicines to shrink the prostate. ? Medicines to relax the muscle of the prostate.  Surgery in severe cases. Surgery may include: ? Prostatectomy. In this procedure, the prostate tissue is removed completely through an open incision or with a laparoscope or robotics. ? Transurethral resection of the prostate (TURP). In this procedure, a tool is inserted through the opening at the tip of the penis (urethra). It is used to cut away tissue of the inner core of the prostate. The pieces are removed through the same opening of the penis. This removes the blockage. ? Transurethral incision (TUIP). In this procedure, small cuts are made in the prostate. This lessens the prostate's pressure on the urethra. ? Transurethral microwave thermotherapy (TUMT). This procedure uses microwaves to create heat. The heat destroys and removes a small amount of prostate  tissue. ? Transurethral needle ablation (TUNA). This procedure uses radio frequencies to destroy and remove a small amount of prostate tissue. ? Interstitial laser coagulation ([place]). This procedure uses a laser to destroy and remove a small amount of prostate tissue. ? Transurethral electrovaporization (TUVP). This procedure uses electrodes to destroy and remove a small amount of prostate tissue. ? Prostatic urethral lift. This procedure inserts an implant to push the lobes of the prostate away from the urethra. Follow these instructions at home:  Take over-the-counter and prescription medicines only as told by your health care provider.  Monitor your symptoms for any changes. Contact your health care provider with any changes.  Avoid drinking large amounts of liquid before going to bed or out in public.  Avoid or reduce how much caffeine or alcohol you drink.  Give yourself time when you urinate.  Keep all follow-up visits as told by your health care provider. This is important. Contact a health care provider if:  You have unexplained back pain.  Your symptoms do not get better with treatment.  You develop side effects from the medicine you are taking.  Your urine becomes very dark or has a bad smell.  Your lower abdomen becomes distended and you have trouble passing your urine. Get help right away if:  You have a fever or chills.  You suddenly cannot urinate.  You feel lightheaded, or very dizzy, or you faint.  There are large amounts of blood or clots in the urine.  Your urinary problems become hard to manage.  You develop moderate to severe low back or flank pain. The flank is the side of your body between the ribs and the hip. These symptoms may represent a serious problem that is an emergency. Do not wait to see if the symptoms will go away. Get medical help right away. Call your local emergency services (911 in the [place] Do not drive yourself to the  hospital. Summary  Benign prostatic hyperplasia (BPH) is an enlarged prostate that is caused by the normal aging process and not by cancer.  An enlarged prostate can press on the urethra. This can make it hard to pass urine.  This condition is part of a normal aging process and is more likely to develop in men  over the age of 61 years.  Get help right away if you suddenly cannot urinate. This information is not intended to replace advice given to you by your health care provider. Make sure you discuss any questions you have with your health care provider. Document Revised: 11/04/2019 Document Reviewed: 11/04/2019 Elsevier Patient Education  [employer].
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
## 2020-07-27 LAB — COMP. METABOLIC PANEL (12)
AST: 20 IU/L (ref 0–40)
Albumin/Globulin Ratio: 1.5 (ref 1.2–2.2)
Albumin: 4.3 g/dL (ref 3.8–4.8)
Alkaline Phosphatase: 61 IU/L (ref 44–121)
BUN/Creatinine Ratio: 14 (ref 10–24)
BUN: 19 mg/dL (ref 8–27)
Bilirubin Total: 0.3 mg/dL (ref 0.0–1.2)
Calcium: 9.4 mg/dL (ref 8.6–10.2)
Chloride: 104 mmol/L (ref 96–106)
Creatinine, Ser: 1.33 mg/dL — ABNORMAL HIGH (ref 0.76–1.27)
Globulin, Total: 2.9 g/dL (ref 1.5–4.5)
Glucose: 103 mg/dL — ABNORMAL HIGH (ref 65–99)
Potassium: 4.5 mmol/L (ref 3.5–5.2)
Sodium: 140 mmol/L (ref 134–144)
Total Protein: 7.2 g/dL (ref 6.0–8.5)
eGFR: 60 mL/min/{1.73_m2} (ref >59)

## 2020-07-27 LAB — LIPID PANEL
Chol/HDL Ratio: 3.9 ratio (ref 0.0–5.0)
Cholesterol, Total: 205 mg/dL — ABNORMAL HIGH (ref 100–199)
HDL: 53 mg/dL (ref >39)
LDL Chol Calc (NIH): 142 mg/dL — ABNORMAL HIGH (ref 0–99)
Triglycerides: 58 mg/dL (ref 0–149)
VLDL Cholesterol Cal: 10 mg/dL (ref 5–40)

## 2020-07-27 LAB — PSA: Prostate Specific Ag, Serum: 4.5 ng/mL — ABNORMAL HIGH (ref 0.0–4.0)

## 2020-08-01 ENCOUNTER — Other Ambulatory Visit: Payer: Self-pay

## 2020-09-17 ENCOUNTER — Emergency Department (HOSPITAL_COMMUNITY)
Admission: EM | Admit: 2020-09-17 | Discharge: 2020-09-17 | Disposition: A | Discharge status: Home / Self Care | Payer: Self-pay | Attending: Emergency Medicine | Admitting: Emergency Medicine

## 2020-09-17 ENCOUNTER — Other Ambulatory Visit: Payer: Self-pay

## 2020-09-17 ENCOUNTER — Encounter (HOSPITAL_COMMUNITY): Payer: Self-pay | Admitting: Emergency Medicine

## 2020-09-17 DIAGNOSIS — G8929 Other chronic pain: Secondary | ICD-10-CM | POA: Insufficient documentation

## 2020-09-17 DIAGNOSIS — I129 Hypertensive chronic kidney disease with stage 1 through stage 4 chronic kidney disease, or unspecified chronic kidney disease: Secondary | ICD-10-CM | POA: Insufficient documentation

## 2020-09-17 DIAGNOSIS — M25572 Pain in left ankle and joints of left foot: Secondary | ICD-10-CM | POA: Insufficient documentation

## 2020-09-17 DIAGNOSIS — Z79899 Other long term (current) drug therapy: Secondary | ICD-10-CM | POA: Insufficient documentation

## 2020-09-17 DIAGNOSIS — N183 Chronic kidney disease, stage 3 unspecified: Secondary | ICD-10-CM | POA: Insufficient documentation

## 2020-09-17 DIAGNOSIS — Z7984 Long term (current) use of oral hypoglycemic drugs: Secondary | ICD-10-CM | POA: Insufficient documentation

## 2020-09-17 DIAGNOSIS — E1122 Type 2 diabetes mellitus with diabetic chronic kidney disease: Secondary | ICD-10-CM | POA: Insufficient documentation

## 2020-09-17 DIAGNOSIS — Z85528 Personal history of other malignant neoplasm of kidney: Secondary | ICD-10-CM | POA: Insufficient documentation

## 2020-09-17 MED ORDER — METHOCARBAMOL 500 MG PO TABS: 500.0000 mg | ORAL_TABLET | Freq: Once | ORAL | Status: DC

## 2020-09-17 MED ORDER — ACETAMINOPHEN 500 MG PO TABS: 1000.0000 mg | ORAL_TABLET | Freq: Once | ORAL | Status: DC
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
## 2020-09-17 NOTE — ED Triage Notes (Signed)
Pt states he woke up 'in so much pain' this morning with his L ankle, previous break in March, denies recent falls, trauma or new injuries.

## 2020-09-17 NOTE — ED Notes (Signed)
Patient was informed of discharge by provider, started becoming verbally aggressive and security had to be called. Was demanding an X-ray and 'pain medication.' Declined the tylenol and robaxin that the PA ordered. Security escorted off of the property, patient visualized driving off in personal vehicle.

## 2020-09-17 NOTE — Discharge Instructions (Addendum)
Please increase your gabapentin to 3 times daily.  Please take Tylenol 1000 mg every 6 hours for pain rest ice and elevate your ankle and follow-up with your primary care provider.

## 2020-09-18 ENCOUNTER — Ambulatory Visit (INDEPENDENT_AMBULATORY_CARE_PROVIDER_SITE_OTHER): Payer: Self-pay | Admitting: Nurse Practitioner

## 2020-09-18 ENCOUNTER — Ambulatory Visit (HOSPITAL_COMMUNITY)
Admission: RE | Admit: 2020-09-18 | Discharge: 2020-09-18 | Disposition: A | Discharge status: Home / Self Care | Payer: Self-pay | Source: Ambulatory Visit | Attending: Nurse Practitioner | Admitting: Nurse Practitioner

## 2020-09-18 ENCOUNTER — Encounter: Payer: Self-pay | Admitting: Nurse Practitioner

## 2020-09-18 VITALS — BP 140/88 | HR 45 | Ht 71.0 in

## 2020-09-18 DIAGNOSIS — G8929 Other chronic pain: Secondary | ICD-10-CM | POA: Insufficient documentation

## 2020-09-18 DIAGNOSIS — M5442 Lumbago with sciatica, left side: Secondary | ICD-10-CM

## 2020-09-18 DIAGNOSIS — M25579 Pain in unspecified ankle and joints of unspecified foot: Secondary | ICD-10-CM | POA: Insufficient documentation

## 2020-09-18 DIAGNOSIS — M5441 Lumbago with sciatica, right side: Secondary | ICD-10-CM | POA: Insufficient documentation

## 2020-09-18 MED ORDER — METHOCARBAMOL 500 MG PO TABS: 500.0000 mg | ORAL_TABLET | Freq: Three times a day (TID) | ORAL | 0 refills | Status: AC | PRN

## 2020-09-18 MED ORDER — OXYCODONE-ACETAMINOPHEN 5-325 MG PO TABS: 1.0000 | ORAL_TABLET | Freq: Three times a day (TID) | ORAL | 0 refills | Status: AC | PRN
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
## 2020-09-20 ENCOUNTER — Other Ambulatory Visit: Payer: Self-pay

## 2020-09-20 ENCOUNTER — Emergency Department (HOSPITAL_COMMUNITY)
Admission: EM | Admit: 2020-09-20 | Discharge: 2020-09-20 | Disposition: A | Discharge status: Home / Self Care | Payer: Self-pay | Attending: Emergency Medicine | Admitting: Emergency Medicine

## 2020-09-20 ENCOUNTER — Emergency Department (HOSPITAL_COMMUNITY): Payer: Self-pay

## 2020-09-20 ENCOUNTER — Encounter (HOSPITAL_COMMUNITY): Payer: Self-pay | Admitting: Emergency Medicine

## 2020-09-20 DIAGNOSIS — R030 Elevated blood-pressure reading, without diagnosis of hypertension: Secondary | ICD-10-CM

## 2020-09-20 DIAGNOSIS — W010XXA Fall on same level from slipping, tripping and stumbling without subsequent striking against object, initial encounter: Secondary | ICD-10-CM | POA: Insufficient documentation

## 2020-09-20 DIAGNOSIS — Y92512 Supermarket, store or market as the place of occurrence of the external cause: Secondary | ICD-10-CM | POA: Insufficient documentation

## 2020-09-20 DIAGNOSIS — E1122 Type 2 diabetes mellitus with diabetic chronic kidney disease: Secondary | ICD-10-CM | POA: Insufficient documentation

## 2020-09-20 DIAGNOSIS — Z7984 Long term (current) use of oral hypoglycemic drugs: Secondary | ICD-10-CM | POA: Insufficient documentation

## 2020-09-20 DIAGNOSIS — R600 Localized edema: Secondary | ICD-10-CM | POA: Insufficient documentation

## 2020-09-20 DIAGNOSIS — M545 Low back pain, unspecified: Secondary | ICD-10-CM | POA: Insufficient documentation

## 2020-09-20 DIAGNOSIS — N183 Chronic kidney disease, stage 3 unspecified: Secondary | ICD-10-CM | POA: Insufficient documentation

## 2020-09-20 DIAGNOSIS — M25561 Pain in right knee: Secondary | ICD-10-CM | POA: Insufficient documentation

## 2020-09-20 DIAGNOSIS — E1169 Type 2 diabetes mellitus with other specified complication: Secondary | ICD-10-CM | POA: Insufficient documentation

## 2020-09-20 DIAGNOSIS — M79671 Pain in right foot: Secondary | ICD-10-CM | POA: Insufficient documentation

## 2020-09-20 DIAGNOSIS — E785 Hyperlipidemia, unspecified: Secondary | ICD-10-CM | POA: Insufficient documentation

## 2020-09-20 DIAGNOSIS — M25572 Pain in left ankle and joints of left foot: Secondary | ICD-10-CM | POA: Insufficient documentation

## 2020-09-20 DIAGNOSIS — Z79899 Other long term (current) drug therapy: Secondary | ICD-10-CM | POA: Insufficient documentation

## 2020-09-20 DIAGNOSIS — M79672 Pain in left foot: Secondary | ICD-10-CM | POA: Insufficient documentation

## 2020-09-20 DIAGNOSIS — I129 Hypertensive chronic kidney disease with stage 1 through stage 4 chronic kidney disease, or unspecified chronic kidney disease: Secondary | ICD-10-CM | POA: Insufficient documentation

## 2020-09-20 DIAGNOSIS — G8929 Other chronic pain: Secondary | ICD-10-CM | POA: Insufficient documentation

## 2020-09-20 DIAGNOSIS — Z85528 Personal history of other malignant neoplasm of kidney: Secondary | ICD-10-CM | POA: Insufficient documentation

## 2020-09-20 MED ORDER — KETOROLAC TROMETHAMINE 15 MG/ML IJ SOLN
30.0000 mg | Freq: Once | INTRAMUSCULAR | Status: AC
  Administered 2020-09-20: 30 mg via INTRAMUSCULAR
  Filled 2020-09-20: qty 2

## 2020-09-20 MED ORDER — LIDOCAINE 5 % EX PTCH
1.0000 | MEDICATED_PATCH | CUTANEOUS | Status: DC
  Administered 2020-09-20: 1 via TRANSDERMAL
  Filled 2020-09-20: qty 1

## 2020-09-20 MED ORDER — ACETAMINOPHEN ER 650 MG PO TBCR
650.0000 mg | EXTENDED_RELEASE_TABLET | Freq: Three times a day (TID) | ORAL | 0 refills | Status: DC | PRN
  Filled 2020-09-20: qty 20, 7d supply, fill #0

## 2020-09-20 MED ORDER — METHOCARBAMOL 500 MG PO TABS
500.0000 mg | ORAL_TABLET | Freq: Once | ORAL | Status: AC
  Administered 2020-09-20: 500 mg via ORAL
  Filled 2020-09-20: qty 1

## 2020-09-20 MED ORDER — METHOCARBAMOL 500 MG PO TABS
500.0000 mg | ORAL_TABLET | Freq: Two times a day (BID) | ORAL | 0 refills | Status: DC
  Filled 2020-09-20: qty 20, 10d supply, fill #0
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
## 2020-09-20 NOTE — Progress Notes (Signed)
Orthopedic Tech Progress Note Patient Details:  Jon Harper 1955/05/30 [id number]  Ortho Devices Type of Ortho Device: Knee Sleeve, Crutches Ortho Device/Splint Location: Right Knee Ortho Device/Splint Interventions: Application   Post Interventions Patient Tolerated: Well Instructions Provided: Adjustment of device  Leita Lindbloom E Marypat Kimmet 09/20/2020, 9:48 AM

## 2020-09-20 NOTE — ED Notes (Signed)
Ortho tech called 

## 2020-09-20 NOTE — ED Triage Notes (Signed)
Patient presents with all over body pain s/p falling onto his knees and back in [employer] yesterday. Patient states he "re-injured" himself during the fall.

## 2020-09-20 NOTE — ED Notes (Signed)
Ortho tech applied knee brace and education with crutches. Pt verbalized understanding.

## 2020-09-20 NOTE — Discharge Instructions (Addendum)
It was a pleasure taking care of you today. As discussed, all of your x-rays were negative for any broken bones. It did show some arthritis. I am sending you home with pain medication and a muscle relaxer. Take as needed for pain. Continue to ice and elevate your right leg. I have included the number of the orthopedic surgeon. Call today to schedule an appointment for further evaluation of your pain. Return to the ER for new or worsening symptoms.   Your blood pressure was elevated today. Continue to take your medication as prescribed. Have your BP rechecked in 1 week.

## 2020-09-20 NOTE — ED Notes (Signed)
Pt taken to xray at this time.

## 2020-09-25 ENCOUNTER — Other Ambulatory Visit: Payer: Self-pay

## 2020-09-26 ENCOUNTER — Ambulatory Visit (INDEPENDENT_AMBULATORY_CARE_PROVIDER_SITE_OTHER): Payer: MEDICAID

## 2020-09-26 ENCOUNTER — Ambulatory Visit (INDEPENDENT_AMBULATORY_CARE_PROVIDER_SITE_OTHER): Payer: Self-pay | Admitting: Podiatry

## 2020-09-26 ENCOUNTER — Other Ambulatory Visit: Payer: Self-pay

## 2020-09-26 DIAGNOSIS — M7751 Other enthesopathy of right foot: Secondary | ICD-10-CM

## 2020-09-26 DIAGNOSIS — M778 Other enthesopathies, not elsewhere classified: Secondary | ICD-10-CM

## 2020-09-26 DIAGNOSIS — M79671 Pain in right foot: Secondary | ICD-10-CM

## 2020-09-26 DIAGNOSIS — M7752 Other enthesopathy of left foot: Secondary | ICD-10-CM

## 2020-09-28 ENCOUNTER — Encounter: Payer: Self-pay | Admitting: Podiatry
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
## 2020-10-15 ENCOUNTER — Emergency Department (HOSPITAL_COMMUNITY): Payer: Self-pay

## 2020-10-15 ENCOUNTER — Encounter (HOSPITAL_COMMUNITY): Payer: Self-pay

## 2020-10-15 ENCOUNTER — Other Ambulatory Visit: Payer: Self-pay

## 2020-10-15 ENCOUNTER — Emergency Department (HOSPITAL_COMMUNITY)
Admission: EM | Admit: 2020-10-15 | Discharge: 2020-10-15 | Disposition: A | Discharge status: Home / Self Care | Payer: Self-pay | Attending: Emergency Medicine | Admitting: Emergency Medicine

## 2020-10-15 DIAGNOSIS — M25422 Effusion, left elbow: Secondary | ICD-10-CM

## 2020-10-15 DIAGNOSIS — N183 Chronic kidney disease, stage 3 unspecified: Secondary | ICD-10-CM | POA: Insufficient documentation

## 2020-10-15 DIAGNOSIS — R2232 Localized swelling, mass and lump, left upper limb: Secondary | ICD-10-CM | POA: Insufficient documentation

## 2020-10-15 DIAGNOSIS — E1122 Type 2 diabetes mellitus with diabetic chronic kidney disease: Secondary | ICD-10-CM | POA: Insufficient documentation

## 2020-10-15 DIAGNOSIS — Z7984 Long term (current) use of oral hypoglycemic drugs: Secondary | ICD-10-CM | POA: Insufficient documentation

## 2020-10-15 DIAGNOSIS — I129 Hypertensive chronic kidney disease with stage 1 through stage 4 chronic kidney disease, or unspecified chronic kidney disease: Secondary | ICD-10-CM | POA: Insufficient documentation

## 2020-10-15 DIAGNOSIS — R531 Weakness: Secondary | ICD-10-CM | POA: Insufficient documentation

## 2020-10-15 DIAGNOSIS — M25522 Pain in left elbow: Secondary | ICD-10-CM | POA: Insufficient documentation

## 2020-10-15 DIAGNOSIS — Z79899 Other long term (current) drug therapy: Secondary | ICD-10-CM | POA: Insufficient documentation

## 2020-10-15 LAB — C-REACTIVE PROTEIN: CRP: 1.4 mg/dL — ABNORMAL HIGH (ref <1.0)

## 2020-10-15 LAB — CBC WITH DIFFERENTIAL/PLATELET
Abs Immature Granulocytes: 0.02 10*3/uL (ref 0.00–0.07)
Basophils Absolute: 0 10*3/uL (ref 0.0–0.1)
Basophils Relative: 0 %
Eosinophils Absolute: 0.2 10*3/uL (ref 0.0–0.5)
Eosinophils Relative: 2 %
HCT: 38.6 % — ABNORMAL LOW (ref 39.0–52.0)
Hemoglobin: 13.1 g/dL (ref 13.0–17.0)
Immature Granulocytes: 0 %
Lymphocytes Relative: 19 %
Lymphs Abs: 1.8 10*3/uL (ref 0.7–4.0)
MCH: 27.9 pg (ref 26.0–34.0)
MCHC: 33.9 g/dL (ref 30.0–36.0)
MCV: 82.3 fL (ref 80.0–100.0)
Monocytes Absolute: 0.8 10*3/uL (ref 0.1–1.0)
Monocytes Relative: 8 %
Neutro Abs: 6.6 10*3/uL (ref 1.7–7.7)
Neutrophils Relative %: 71 %
Platelets: 146 10*3/uL — ABNORMAL LOW (ref 150–400)
RBC: 4.69 MIL/uL (ref 4.22–5.81)
RDW: 14.5 % (ref 11.5–15.5)
WBC: 9.4 10*3/uL (ref 4.0–10.5)
nRBC: 0 % (ref 0.0–0.2)

## 2020-10-15 LAB — BASIC METABOLIC PANEL
Anion gap: 6 (ref 5–15)
BUN: 19 mg/dL (ref 8–23)
CO2: 29 mmol/L (ref 22–32)
Calcium: 9.7 mg/dL (ref 8.9–10.3)
Chloride: 106 mmol/L (ref 98–111)
Creatinine, Ser: 1.33 mg/dL — ABNORMAL HIGH (ref 0.61–1.24)
GFR, Estimated: >60 mL/min (ref >60)
Glucose, Bld: 134 mg/dL — ABNORMAL HIGH (ref 70–99)
Potassium: 4.2 mmol/L (ref 3.5–5.1)
Sodium: 141 mmol/L (ref 135–145)

## 2020-10-15 LAB — SEDIMENTATION RATE: Sed Rate: 22 mm/hr — ABNORMAL HIGH (ref 0–16)

## 2020-10-15 MED ORDER — FENTANYL CITRATE (PF) 100 MCG/2ML IJ SOLN
50.0000 ug | Freq: Once | INTRAMUSCULAR | Status: AC
Start: 2020-10-15 — End: 2020-10-15
  Administered 2020-10-15: 50 ug via INTRAVENOUS
  Filled 2020-10-15: qty 2

## 2020-10-15 MED ORDER — FENTANYL CITRATE (PF) 100 MCG/2ML IJ SOLN
50.0000 ug | Freq: Once | INTRAMUSCULAR | Status: AC
  Administered 2020-10-15: 50 ug via INTRAVENOUS
  Filled 2020-10-15: qty 2

## 2020-10-15 MED ORDER — KETOROLAC TROMETHAMINE 30 MG/ML IJ SOLN
15.0000 mg | Freq: Once | INTRAMUSCULAR | Status: AC
  Administered 2020-10-15: 15 mg via INTRAVENOUS
  Filled 2020-10-15: qty 1

## 2020-10-15 MED ORDER — MORPHINE SULFATE (PF) 2 MG/ML IV SOLN
2.0000 mg | Freq: Once | INTRAVENOUS | Status: AC
Start: 2020-10-15 — End: 2020-10-15
  Administered 2020-10-15: 2 mg via INTRAVENOUS
  Filled 2020-10-15: qty 1

## 2020-10-15 MED ORDER — OXYCODONE HCL 5 MG PO TABS: 2.5000 mg | ORAL_TABLET | Freq: Four times a day (QID) | ORAL | 0 refills | Status: DC | PRN

## 2020-10-15 MED ORDER — DEXAMETHASONE SODIUM PHOSPHATE 10 MG/ML IJ SOLN
5.0000 mg | Freq: Once | INTRAMUSCULAR | Status: AC
  Administered 2020-10-15: 5 mg via INTRAVENOUS
  Filled 2020-10-15: qty 1

## 2020-10-15 NOTE — ED Provider Notes (Signed)
Patient taken in handout at shift change from [facility]  65 year old male with a history of diabetes hypertension chronic pain who presents with left elbow pain for 2 days.  Currently awaiting imaging, labs, sed rate and CRP.  Will reevaluate after work-up has returned   I have reviewed the patient's imaging which shows a large elbow effusion.  I have reviewed the patient's labs including a BMP and CBC no elevated white blood cell count.  Baseline renal insufficiency see.  Sed rate and C-reactive protein are only mildly elevated.    On physical examination the patient is able to move his elbow but does have a lot of pain.  He does tell me that he was recently diagnosed with gout in his feet.  He is not on any medications to prevent this but denies significant amount of alcohol use.  He does eat a lot of meat.  I suspect this is gout.  Patient treated here with Decadron, Toradol, pain medications and will be discharged with some pain medication.  I discussed the need for close follow-up with both orthopedics and his primary care doctor who could also start him on allopurinol when he has not an acute flare.  I have very low suspicion for septic joint given the patient's ability to move the elbow.  Patient appears appropriate for discharge at this time and states that he is feeling greatly improved.   Margarita Mail, PA-C 10/15/20 1850    Davonna Belling, MD 10/15/20 [phone]

## 2020-10-15 NOTE — Discharge Instructions (Addendum)
Contact a health care provider if you have: °Another gout attack. °Continuing symptoms of a gout attack after 10 days of treatment. °Side effects from your medicines. °Chills or a fever. °Burning pain when you urinate. °Pain in your lower back or belly. °Get help right away if you: °Have severe or uncontrolled pain. °Cannot urinate. °

## 2020-10-15 NOTE — ED Triage Notes (Signed)
Pt reports decreased ROM to left arm from shoulder to fingers. Swelling to elbow noted. Patient states heat helps and decreased swelling.

## 2020-10-15 NOTE — ED Provider Notes (Signed)
[name] DEPT Provider Note   CSN: [id number] Arrival date & time: 10/15/20  1339     History Chief Complaint  Patient presents with   Extremity Weakness    Jon Harper is a 65 y.o. male history of diabetes, hypertension, chronic pain.  Patient presents to the ER for evaluation of left elbow pain and swelling.  Symptoms began 2 days ago pain is severe constant aching worsens with movement improves with applying heat.  Associated with some swelling around the left elbow.  Denies similar in the past.  Denies any injury or trauma.  Patient reports pain will occasionally radiate down his forearm.  Patient denies fever/chills, chest pain/shortness of breath, cough, numbness/weakness, tingling, vision changes, headache/neck pain, back pain, dizziness or any additional concerns.   HPI     Past Medical History:  Diagnosis Date   Chronic low back pain    Diabetes mellitus    Hypertension    Neck pain    Neck pain    Skin rash 10/2018   Tooth abscess 10/2018    Patient Active Problem List   Diagnosis Date Noted   Renal mass, right 07/20/2019   Hemoglobin A1c less than 7.0% 02/04/2019   Proteinuria 02/04/2019   Chronic joint pain 02/04/2019   Overgrown toenails 02/04/2019   Tooth abscess 11/03/2018   Tooth pain 11/03/2018   Skin rash 11/03/2018   Chronic bilateral low back pain with bilateral sciatica 09/30/2018   Prediabetes 09/30/2018   PAC (premature atrial contraction) 07/07/2017   Type 2 diabetes mellitus without complication, without long-term current use of insulin ([place]) 04/16/2017   Abnormal EKG 04/16/2017   Essential hypertension 04/16/2017   Hyperlipidemia associated with type 2 diabetes mellitus ([place]) 04/16/2017   Hx of renal cell cancer 04/16/2017   Stage 3 chronic kidney disease ([place]) 04/16/2017   History of unilateral nephrectomy 04/16/2017    Past Surgical History:  Procedure Laterality Date   right nephrectomy          Family History  Problem Relation Age of Onset   Diabetes Mother        non-insulin    Diabetes Father        non-insulin    Diabetes Sister        non-insulin     Social History   Tobacco Use   Smoking status: Never   Smokeless tobacco: Never  Vaping Use   Vaping Use: Never used  Substance Use Topics   Alcohol use: No   Drug use: No    Home Medications Prior to Admission medications   Medication Sig Start Date End Date Taking? Authorizing Provider  acetaminophen (TYLENOL 8 HOUR) 650 MG CR tablet Take 1 tablet (650 mg total) by mouth every 8 (eight) hours as needed for pain. 09/20/20   Suzy Bouchard, PA-C  acetaminophen (TYLENOL) 500 MG tablet Take 1 tablet (500 mg total) by mouth in the morning and at bedtime. 01/25/20   Azzie Glatter, FNP  atorvastatin (LIPITOR) 40 MG tablet TAKE 1 TABLET (40 MG TOTAL) BY MOUTH DAILY. 07/26/20 07/26/21  Vevelyn Francois, NP  gabapentin (NEURONTIN) 300 MG capsule TAKE 1 CAPSULE (300 MG TOTAL) BY MOUTH 2 (TWO) TIMES DAILY. 07/26/20 07/26/21  Vevelyn Francois, NP  glipiZIDE (GLUCOTROL) 10 MG tablet TAKE 1 TABLET (10 MG TOTAL) BY MOUTH 2 (TWO) TIMES DAILY BEFORE A MEAL. 07/26/20 07/26/21  Vevelyn Francois, NP  hydrocortisone cream 0.5 % Apply 1 application topically 2 (two)  times daily. Patient not taking: No sig reported 10/26/18   Azzie Glatter, FNP  losartan (COZAAR) 50 MG tablet TAKE 2 TABLETS (100 MG TOTAL) BY MOUTH DAILY. 07/26/20 07/26/21  Vevelyn Francois, NP  methocarbamol (ROBAXIN) 500 MG tablet Take 1 tablet (500 mg total) by mouth 2 (two) times daily. 09/20/20   Suzy Bouchard, PA-C  metoprolol tartrate (LOPRESSOR) 25 MG tablet TAKE 1 TABLET (25 MG TOTAL) BY MOUTH 2 (TWO) TIMES DAILY. 07/26/20 07/26/21  Vevelyn Francois, NP  tamsulosin (FLOMAX) 0.4 MG CAPS capsule Take 2 capsules (0.8 mg total) by mouth daily. 07/26/20 07/26/21  Vevelyn Francois, NP    Allergies    Patient has no known allergies.  Review of Systems   Review  of Systems Ten systems are reviewed and are negative for acute change except as noted in the HPI  Physical Exam Updated Vital Signs BP (!) 158/118 (BP Location: Right Arm)   Pulse 88   Temp 98.6 F (37 C) (Oral)   Resp 19   Ht 5' 11"  (8.466 m)   Wt 126 kg   SpO2 100%   BMI 38.74 kg/m   Physical Exam Constitutional:      General: He is not in acute distress.    Appearance: Normal appearance. He is well-developed. He is not ill-appearing or diaphoretic.  HENT:     Head: Normocephalic and atraumatic.  Eyes:     General: Vision grossly intact. Gaze aligned appropriately.     Pupils: Pupils are equal, round, and reactive to light.  Neck:     Trachea: Trachea and phonation normal.  Pulmonary:     Effort: Pulmonary effort is normal. No respiratory distress.  Abdominal:     General: There is no distension.     Palpations: Abdomen is soft.     Tenderness: There is no abdominal tenderness. There is no guarding or rebound.  Musculoskeletal:        General: Normal range of motion.     Cervical back: Normal range of motion.     Comments: Left elbow: Mild swelling present.  No overlying skin changes.  No skin break.  No tenting or deformity.  Diffuse tenderness palpation mostly over the lateral epicondyle.  No TTP of the shoulder or wrist.  Patient unable to perform range of motion secondary to pain.  Capillary refill and sensation intact to the fingers.  Strong and equal radial pulses.  Compartments soft.  Skin:    General: Skin is warm and dry.  Neurological:     Mental Status: He is alert.     GCS: GCS eye subscore is 4. GCS verbal subscore is 5. GCS motor subscore is 6.     Comments: Speech is clear and goal oriented, follows commands Major Cranial nerves without deficit, no facial droop Sensation normal to light and sharp touch Moves extremities without ataxia, coordination intact  Psychiatric:        Behavior: Behavior normal.    ED Results / Procedures / Treatments    Labs (all labs ordered are listed, but only abnormal results are displayed) Labs Reviewed  CBC WITH DIFFERENTIAL/PLATELET  BASIC METABOLIC PANEL  SEDIMENTATION RATE  C-REACTIVE PROTEIN    EKG None  Radiology No results found.  Procedures Procedures   Medications Ordered in ED Medications  morphine 2 MG/ML injection 2 mg (2 mg Intravenous Given 10/15/20 1442)    ED Course  I have reviewed the triage vital signs and the nursing notes.  Pertinent labs & imaging results that were available during my care of the patient were reviewed by me and considered in my medical decision making (see chart for details).    MDM Rules/Calculators/A&P                          Additional history obtained from: Nursing notes from this visit. Review of electronic medical records. --------------- 65 year old male presented for left elbow pain and swelling, atraumatic.  On exam patient with very limited range of motion due to pain.  Neurovascular intact distally.  Some swelling and tenderness is noted particularly over the lateral upper condyle.  Denies any infectious type symptoms and vital signs are stable on arrival.  Plan of care is x-ray imaging and lab work including CBC, BMP, CRP and ESR.  Of note triage document reports extremity weakness.  He has strong and equal grip strength only limited at the left elbow and I feel this is secondary to pain and not truly weakness.  Cranial nerves are intact, no neurologic complaint.  Suspicion for central nervous process at this time.  Care handoff given to Margarita Mail, PA-C at shift change, plan of care is to follow-up on labs, imaging.  Final disposition per oncoming team.   Note: Portions of this report may have been transcribed using voice recognition software. Every effort was made to ensure accuracy; however, inadvertent computerized transcription errors may still be present.  Final Clinical Impression(s) / ED Diagnoses Final diagnoses:  None     Rx / DC Orders ED Discharge Orders     None        Gari Crown 10/15/20 1519    Carmin Muskrat, MD 10/16/20 [phone]

## 2020-10-25 ENCOUNTER — Other Ambulatory Visit: Payer: Self-pay

## 2020-10-26 ENCOUNTER — Other Ambulatory Visit: Payer: Self-pay

## 2020-11-01 ENCOUNTER — Ambulatory Visit: Payer: Self-pay | Admitting: Nurse Practitioner

## 2020-11-07 ENCOUNTER — Ambulatory Visit: Payer: Self-pay | Admitting: Podiatry

## 2020-11-23 ENCOUNTER — Other Ambulatory Visit: Payer: Self-pay

## 2020-11-27 ENCOUNTER — Other Ambulatory Visit: Payer: Self-pay

## 2020-12-01 ENCOUNTER — Other Ambulatory Visit (HOSPITAL_BASED_OUTPATIENT_CLINIC_OR_DEPARTMENT_OTHER): Payer: Self-pay

## 2020-12-12 ENCOUNTER — Other Ambulatory Visit: Payer: Self-pay

## 2020-12-15 ENCOUNTER — Other Ambulatory Visit: Payer: Self-pay

## 2020-12-15 ENCOUNTER — Other Ambulatory Visit (HOSPITAL_BASED_OUTPATIENT_CLINIC_OR_DEPARTMENT_OTHER): Payer: Self-pay

## 2020-12-19 ENCOUNTER — Emergency Department (HOSPITAL_BASED_OUTPATIENT_CLINIC_OR_DEPARTMENT_OTHER): Payer: Self-pay | Admitting: Radiology

## 2020-12-19 ENCOUNTER — Encounter (HOSPITAL_BASED_OUTPATIENT_CLINIC_OR_DEPARTMENT_OTHER): Payer: Self-pay | Admitting: *Deleted

## 2020-12-19 ENCOUNTER — Other Ambulatory Visit: Payer: Self-pay

## 2020-12-19 ENCOUNTER — Emergency Department (HOSPITAL_BASED_OUTPATIENT_CLINIC_OR_DEPARTMENT_OTHER)
Admission: EM | Admit: 2020-12-19 | Discharge: 2020-12-19 | Disposition: A | Discharge status: Home / Self Care | Payer: Self-pay | Attending: Emergency Medicine | Admitting: Emergency Medicine

## 2020-12-19 DIAGNOSIS — Z7984 Long term (current) use of oral hypoglycemic drugs: Secondary | ICD-10-CM | POA: Insufficient documentation

## 2020-12-19 DIAGNOSIS — Z79899 Other long term (current) drug therapy: Secondary | ICD-10-CM | POA: Insufficient documentation

## 2020-12-19 DIAGNOSIS — R079 Chest pain, unspecified: Secondary | ICD-10-CM

## 2020-12-19 DIAGNOSIS — Z20822 Contact with and (suspected) exposure to covid-19: Secondary | ICD-10-CM | POA: Insufficient documentation

## 2020-12-19 DIAGNOSIS — R0789 Other chest pain: Secondary | ICD-10-CM | POA: Insufficient documentation

## 2020-12-19 DIAGNOSIS — N183 Chronic kidney disease, stage 3 unspecified: Secondary | ICD-10-CM | POA: Insufficient documentation

## 2020-12-19 DIAGNOSIS — E1122 Type 2 diabetes mellitus with diabetic chronic kidney disease: Secondary | ICD-10-CM | POA: Insufficient documentation

## 2020-12-19 DIAGNOSIS — I129 Hypertensive chronic kidney disease with stage 1 through stage 4 chronic kidney disease, or unspecified chronic kidney disease: Secondary | ICD-10-CM | POA: Insufficient documentation

## 2020-12-19 DIAGNOSIS — Z85520 Personal history of malignant carcinoid tumor of kidney: Secondary | ICD-10-CM | POA: Insufficient documentation

## 2020-12-19 DIAGNOSIS — Z28310 Unvaccinated for covid-19: Secondary | ICD-10-CM | POA: Insufficient documentation

## 2020-12-19 DIAGNOSIS — J069 Acute upper respiratory infection, unspecified: Secondary | ICD-10-CM | POA: Insufficient documentation

## 2020-12-19 DIAGNOSIS — M542 Cervicalgia: Secondary | ICD-10-CM | POA: Insufficient documentation

## 2020-12-19 LAB — RESP PANEL BY RT-PCR (FLU A&B, COVID) ARPGX2
Influenza A by PCR: NEGATIVE
Influenza B by PCR: NEGATIVE
SARS Coronavirus 2 by RT PCR: NEGATIVE

## 2020-12-19 LAB — CBC
HCT: 41 % (ref 39.0–52.0)
Hemoglobin: 13.9 g/dL (ref 13.0–17.0)
MCH: 27.5 pg (ref 26.0–34.0)
MCHC: 33.9 g/dL (ref 30.0–36.0)
MCV: 81.2 fL (ref 80.0–100.0)
Platelets: 155 10*3/uL (ref 150–400)
RBC: 5.05 MIL/uL (ref 4.22–5.81)
RDW: 14.9 % (ref 11.5–15.5)
WBC: 10 10*3/uL (ref 4.0–10.5)
nRBC: 0 % (ref 0.0–0.2)

## 2020-12-19 LAB — TROPONIN I (HIGH SENSITIVITY)
Troponin I (High Sensitivity): 8 ng/L (ref <18)
Troponin I (High Sensitivity): 8 ng/L (ref <18)

## 2020-12-19 LAB — BASIC METABOLIC PANEL
Anion gap: 9 (ref 5–15)
BUN: 22 mg/dL (ref 8–23)
CO2: 24 mmol/L (ref 22–32)
Calcium: 10.2 mg/dL (ref 8.9–10.3)
Chloride: 105 mmol/L (ref 98–111)
Creatinine, Ser: 1.33 mg/dL — ABNORMAL HIGH (ref 0.61–1.24)
GFR, Estimated: >60 mL/min (ref >60)
Glucose, Bld: 142 mg/dL — ABNORMAL HIGH (ref 70–99)
Potassium: 4.3 mmol/L (ref 3.5–5.1)
Sodium: 138 mmol/L (ref 135–145)

## 2020-12-19 MED ORDER — METHOCARBAMOL 500 MG PO TABS
500.0000 mg | ORAL_TABLET | Freq: Three times a day (TID) | ORAL | 0 refills | Status: DC | PRN
  Filled 2020-12-19: qty 8, 3d supply, fill #0

## 2020-12-19 NOTE — ED Provider Notes (Signed)
[facility] EMERGENCY DEPT Provider Note   CSN: [id number] Arrival date & time: 12/19/20  1630     History Chief Complaint  Patient presents with   Cough   Neck Pain   Chest Pain    Jon Harper is a 65 y.o. male.   Cough Associated symptoms: chest pain   Associated symptoms: no rash   Neck Pain Associated symptoms: chest pain   Associated symptoms: no weakness   Chest Pain Associated symptoms: cough   Associated symptoms: no abdominal pain, no back pain and no weakness   Patient states that he has had a cough for around 3 weeks.  States been productive of sputum but has not looked at the sputum.  States over the last few days he has had some left-sided chest pain.  Worse with coughing.  It does go up to the neck at times.  Not exertional.  States it is also there is some but much worse when he coughs.  Worse with certain movements.  No numbness or weakness.  Does not feel short of breath.  Still continues to have a cough.  No leg pain or swelling.  No abdominal pain.  No known sick contacts.  Has not had COVID previously but has been vaccinated.    Past Medical History:  Diagnosis Date   Chronic low back pain    Diabetes mellitus    Hypertension    Neck pain    Neck pain    Skin rash 10/2018   Tooth abscess 10/2018    Patient Active Problem List   Diagnosis Date Noted   Renal mass, right 07/20/2019   Hemoglobin A1c less than 7.0% 02/04/2019   Proteinuria 02/04/2019   Chronic joint pain 02/04/2019   Overgrown toenails 02/04/2019   Tooth abscess 11/03/2018   Tooth pain 11/03/2018   Skin rash 11/03/2018   Chronic bilateral low back pain with bilateral sciatica 09/30/2018   Prediabetes 09/30/2018   PAC (premature atrial contraction) 07/07/2017   Type 2 diabetes mellitus without complication, without long-term current use of insulin ([place]) 04/16/2017   Abnormal EKG 04/16/2017   Essential hypertension 04/16/2017   Hyperlipidemia associated with  type 2 diabetes mellitus ([place]) 04/16/2017   Hx of renal cell cancer 04/16/2017   Stage 3 chronic kidney disease ([place]) 04/16/2017   History of unilateral nephrectomy 04/16/2017    Past Surgical History:  Procedure Laterality Date   right nephrectomy         Family History  Problem Relation Age of Onset   Diabetes Mother        non-insulin    Diabetes Father        non-insulin    Diabetes Sister        non-insulin     Social History   Tobacco Use   Smoking status: Never   Smokeless tobacco: Never  Vaping Use   Vaping Use: Never used  Substance Use Topics   Alcohol use: No   Drug use: No    Home Medications Prior to Admission medications   Medication Sig Start Date End Date Taking? Authorizing Provider  atorvastatin (LIPITOR) 40 MG tablet TAKE 1 TABLET (40 MG TOTAL) BY MOUTH DAILY. 07/26/20 07/26/21 Yes King, Diona Foley, NP  gabapentin (NEURONTIN) 300 MG capsule TAKE 1 CAPSULE (300 MG TOTAL) BY MOUTH 2 (TWO) TIMES DAILY. 07/26/20 07/26/21 Yes King, Crystal M, NP  glipiZIDE (GLUCOTROL) 10 MG tablet TAKE 1 TABLET (10 MG TOTAL) BY MOUTH 2 (TWO) TIMES DAILY BEFORE  A MEAL. 07/26/20 07/26/21 Yes King, Diona Foley, NP  losartan (COZAAR) 50 MG tablet TAKE 2 TABLETS (100 MG TOTAL) BY MOUTH DAILY. 07/26/20 07/26/21 Yes King, Diona Foley, NP  metoprolol tartrate (LOPRESSOR) 25 MG tablet TAKE 1 TABLET (25 MG TOTAL) BY MOUTH 2 (TWO) TIMES DAILY. 07/26/20 07/26/21 Yes Vevelyn Francois, NP  acetaminophen (TYLENOL 8 HOUR) 650 MG CR tablet Take 1 tablet (650 mg total) by mouth every 8 (eight) hours as needed for pain. 09/20/20   Suzy Bouchard, PA-C  acetaminophen (TYLENOL) 500 MG tablet Take 1 tablet (500 mg total) by mouth in the morning and at bedtime. 01/25/20   Azzie Glatter, FNP  hydrocortisone cream 0.5 % Apply 1 application topically 2 (two) times daily. Patient not taking: No sig reported 10/26/18   Azzie Glatter, FNP  methocarbamol (ROBAXIN) 500 MG tablet Take 1 tablet (500 mg total)  by mouth every 8 (eight) hours as needed for muscle spasms. 12/19/20   Davonna Belling, MD  oxyCODONE (ROXICODONE) 5 MG immediate release tablet Take 0.5-1 tablets (2.5-5 mg total) by mouth every 6 (six) hours as needed for severe pain. 10/15/20   Margarita Mail, PA-C  tamsulosin (FLOMAX) 0.4 MG CAPS capsule Take 2 capsules (0.8 mg total) by mouth daily. 07/26/20 07/26/21  Vevelyn Francois, NP    Allergies    Patient has no known allergies.  Review of Systems   Review of Systems  Constitutional:  Negative for appetite change.  HENT:  Negative for congestion.   Respiratory:  Positive for cough.   Cardiovascular:  Positive for chest pain.  Gastrointestinal:  Negative for abdominal pain.  Genitourinary:  Negative for flank pain.  Musculoskeletal:  Positive for neck pain. Negative for back pain.  Skin:  Negative for rash.  Neurological:  Negative for weakness.  Psychiatric/Behavioral:  Negative for confusion.    Physical Exam Updated Vital Signs BP (!) 178/92 (BP Location: Right Arm)   Pulse (!) 47   Temp 98.9 F (37.2 C)   Resp 18   Ht 6' (1.829 m)   Wt 108.9 kg   SpO2 100%   BMI 32.55 kg/m   Physical Exam Vitals and nursing note reviewed.  HENT:     Head: Atraumatic.  Neck:     Comments: Some tenderness to the musculature on left side of neck. Cardiovascular:     Rate and Rhythm: Regular rhythm.  Pulmonary:     Breath sounds: No wheezing, rhonchi or rales.  Chest:     Chest wall: Tenderness present.     Comments: Some tenderness to upper left chest wall.  No rash.  No deformity. Abdominal:     Tenderness: There is no abdominal tenderness.  Musculoskeletal:     Cervical back: Neck supple.     Right lower leg: No tenderness.     Left lower leg: No tenderness.  Skin:    Capillary Refill: Capillary refill takes less than 2 seconds.  Neurological:     Mental Status: He is alert and oriented to person, place, and time.    ED Results / Procedures / Treatments    Labs (all labs ordered are listed, but only abnormal results are displayed) Labs Reviewed  BASIC METABOLIC PANEL - Abnormal; Notable for the following components:      Result Value   Glucose, Bld 142 (*)    Creatinine, Ser 1.33 (*)    All other components within normal limits  RESP PANEL BY RT-PCR (FLU A&B, COVID) ARPGX2  CBC  TROPONIN I (HIGH SENSITIVITY)  TROPONIN I (HIGH SENSITIVITY)    EKG EKG Interpretation  Date/Time:  Tuesday December 19 2020 16:49:49 EDT Ventricular Rate:  100 PR Interval:  168 QRS Duration: 72 QT Interval:  326 QTC Calculation: 420 R Axis:   37 Text Interpretation: Unusual P axis, possible ectopic atrial rhythm with undetermined rhythm irregularity Cannot rule out Anterior infarct , age undetermined Abnormal ECG Confirmed by Davonna Belling [phone]) on 12/19/2020 8:41:17 PM  Radiology DG Chest 2 View  Result Date: 12/19/2020 CLINICAL DATA:  Cough.  Chest pain. EXAM: CHEST - 2 VIEW COMPARISON:  Chest x-ray 05/30/2020 FINDINGS: The heart and mediastinal contours are unchanged. Aortic calcification. No focal consolidation. No pulmonary edema. No pleural effusion. No pneumothorax. No acute osseous abnormality. IMPRESSION: No active cardiopulmonary disease. Electronically Signed   By: Iven Finn M.D.   On: 12/19/2020 17:45    Procedures Procedures   Medications Ordered in ED Medications - No data to display  ED Course  I have reviewed the triage vital signs and the nursing notes.  Pertinent labs & imaging results that were available during my care of the patient were reviewed by me and considered in my medical decision making (see chart for details).    MDM Rules/Calculators/A&P                           Patient with chest pain.  Began after cough.  Has been coughing for about 2 weeks.  Somewhat reproducible.  Somewhat tender on neck also.  X-ray reassuring.  EKG reassuring.  Troponin negative x2.  Doubt cardiac ischemia as a cause.  We will  add COVID test.  Somewhat hypertensive here but not had all his blood pressure medicines.  We will go home and take them.  Does not appear to be pulmonary embolism.  I reviewed x-rays and work-up.  Discharge home with outpatient follow-up as needed Final Clinical Impression(s) / ED Diagnoses Final diagnoses:  Nonspecific chest pain  Upper respiratory tract infection, unspecified type    Rx / DC Orders ED Discharge Orders          Ordered    methocarbamol (ROBAXIN) 500 MG tablet  Every 8 hours PRN        12/19/20 2056             Davonna Belling, MD 12/19/20 2104

## 2020-12-19 NOTE — ED Triage Notes (Signed)
2 days of coughing with left neck and chest pain. Pain is worse when coughing but remains without cough. Cough productive.

## 2020-12-20 ENCOUNTER — Other Ambulatory Visit: Payer: Self-pay

## 2020-12-21 ENCOUNTER — Other Ambulatory Visit: Payer: Self-pay

## 2020-12-26 ENCOUNTER — Other Ambulatory Visit: Payer: Self-pay

## 2020-12-26 ENCOUNTER — Other Ambulatory Visit: Payer: Self-pay | Admitting: Emergency Medicine

## 2020-12-27 ENCOUNTER — Other Ambulatory Visit: Payer: Self-pay

## 2020-12-27 ENCOUNTER — Encounter: Payer: Self-pay | Admitting: Nurse Practitioner

## 2020-12-27 ENCOUNTER — Ambulatory Visit (INDEPENDENT_AMBULATORY_CARE_PROVIDER_SITE_OTHER): Payer: Worker's Compensation | Admitting: Nurse Practitioner

## 2020-12-27 VITALS — BP 127/88 | HR 79 | Temp 98.5°F | Ht 71.0 in | Wt 246.6 lb

## 2020-12-27 DIAGNOSIS — E119 Type 2 diabetes mellitus without complications: Secondary | ICD-10-CM

## 2020-12-27 DIAGNOSIS — G8929 Other chronic pain: Secondary | ICD-10-CM

## 2020-12-27 DIAGNOSIS — M5441 Lumbago with sciatica, right side: Secondary | ICD-10-CM

## 2020-12-27 DIAGNOSIS — I10 Essential (primary) hypertension: Secondary | ICD-10-CM

## 2020-12-27 DIAGNOSIS — Z23 Encounter for immunization: Secondary | ICD-10-CM

## 2020-12-27 DIAGNOSIS — M542 Cervicalgia: Secondary | ICD-10-CM

## 2020-12-27 DIAGNOSIS — M5442 Lumbago with sciatica, left side: Secondary | ICD-10-CM

## 2020-12-27 LAB — POCT GLYCOSYLATED HEMOGLOBIN (HGB A1C)
HbA1c POC (<> result, manual entry): 7.1 % (ref 4.0–5.6)
HbA1c, POC (controlled diabetic range): 7.1 % — AB (ref 0.0–7.0)
HbA1c, POC (prediabetic range): 7.1 % — AB (ref 5.7–6.4)
Hemoglobin A1C: 7.1 % — AB (ref 4.0–5.6)

## 2020-12-27 MED ORDER — DULOXETINE HCL 30 MG PO CPEP
ORAL_CAPSULE | ORAL | 11 refills | Status: DC
  Filled 2020-12-27: qty 60, 33d supply, fill #0

## 2020-12-27 MED ORDER — METHOCARBAMOL 500 MG PO TABS
500.0000 mg | ORAL_TABLET | Freq: Four times a day (QID) | ORAL | 0 refills | Status: AC
  Filled 2020-12-27: qty 60, 15d supply, fill #0
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
## 2020-12-27 NOTE — Patient Instructions (Addendum)
Zoster Vaccine -Shingrix   Salonpas patch  every 8 hours   Cervical Sprain A cervical sprain is also called a neck sprain. It is a stretch or tear in one or more ligaments in the neck. Ligaments are tissues that connect bones to each other. Neck sprains can be mild, bad, or very bad. A very bad sprain in the neck can cause the bones in the neck to be unstable. This can damage the spinal cord. It can also cause serious problems in the brain, spinal cord, and nerves (nervous system). Most neck sprains heal in 4-6 weeks. It can take more or less time depending on: What caused the injury. The amount of injury. What are the causes? Neck sprains may be caused by trauma, such as: An injury from an accident in a vehicle such as a car or boat. A fall. The head and neck being moved front to back or side to side all of a sudden (whiplash injury). Mild neck sprains may be caused by wear and tear over time. What increases the risk? The following factors may make you more likely to develop this condition: Taking part in activities that put you at high risk of hurting your neck. These include: Contact sports. Animator. Gymnastics. Diving. Taking risks when driving or riding in a vehicle such as a car or boat. Arthritis caused by wear and tear of the joints in the spine. The neck not being very strong or flexible. Having had a neck injury in the past. Poor posture. Spending a lot of time in certain positions that put stress on the neck. This may be from sitting at a computer for a long time. What are the signs or symptoms? Symptoms of this condition include: Your neck, shoulders, or upper back feeling: Painful or sore. Stiff. Tender. Swollen. Hot, or like it is burning. Sudden tightening of neck muscles (spasms). Not being able to move the neck very much. Headache. Feeling dizzy. Feeling like you may vomit, or vomiting. Having a hand or arm that: Feels weak. Loses feeling (feels  numb). Tingles. You may get symptoms right away after injury, or you may get them over a few days. In some cases, symptoms may go away with treatment and come back over time. How is this treated? This condition is treated by: Resting your neck. Icing the part of your neck that is hurt. Doing exercises to restore movement and strength to your neck (physical therapy). If there is no swelling, you may use heat therapy 2-3 days after the injury took place. If your injury is very bad, treatment may also include: Keeping your neck in place for a length of time. This may be done using: A neck collar. This supports your chin and the back of your head. A cervical traction device. This is a sling that holds up your head. The sling removes weight and pressure from your neck. It may also help to relieve pain. Medicines that help with: Pain. Irritation and swelling (inflammation). Medicines that help to relax your muscles (muscle relaxants). Surgery. This is rare. Follow these instructions at home: Medicines  Take over-the-counter and prescription medicines only as told by your doctor. Ask your doctor if the medicine prescribed to you: Requires you to avoid driving or using heavy machinery. Can cause trouble pooping (constipation). You may need to take these actions to prevent or treat trouble pooping: Drink enough fluid to keep your pee (urine) pale yellow. Take over-the-counter or prescription medicines. Eat foods that are high  in fiber. These include beans, whole grains, and fresh fruits and vegetables. Limit foods that are high in fat and sugar. These include fried or sweet foods. If you have a neck collar: Wear it as told by your doctor. Do not take it off unless told. Ask your doctor before adjusting your collar. If you have long hair, keep it outside of the collar. Ask your doctor if you may take off the collar for cleaning and bathing. If you may take off the collar: Follow instructions  about how to take it off safely. Clean it by hand with mild soap and water. Let it air-dry fully. If your collar has pads that you can take out: Take the pads out every 1-2 days. Wash them by hand with soap and water. Let the pads air-dry fully before you put them back in the collar. Tell your doctor if your skin under the collar has irritation or sores. Managing pain, stiffness, and swelling   Use a cervical traction device, if told by your doctor. If told, put ice on the affected area. To do this: Put ice in a plastic bag. Place a towel between your skin and the bag. Leave the ice on for 20 minutes, 2-3 times a day. If told, put heat on the affected area. Do this before exercise or as often as told by your doctor. Use the heat source that your doctor recommends, such as a moist heat pack or a heating pad. Place a towel between your skin and the heat source. Leave the heat on for 20-30 minutes. Take the heat off if your skin turns bright red. This is very important if you cannot feel pain, heat, or cold. You may have a greater risk of getting burned. Activity Do not drive while wearing a neck collar. If you do not have a neck collar, ask if it is safe to drive while your neck heals. Do not lift anything that is heavier than 10 lb (4.5 kg), or the limit that you are told, until your doctor tells you that it is safe. Rest as told by your doctor. Do exercises as told by your doctor or physical therapist. Return to your normal activities as told by your doctor. Avoid positions and activities that make you feel worse. Ask your doctor what activities are safe for you. General instructions Do not use any products that contain nicotine or tobacco, such as cigarettes, e-cigarettes, and chewing tobacco. These can delay healing. If you need help quitting, ask your doctor. Keep all follow-up visits as told by your doctor or physical therapist. This is important. How is this prevented? To prevent a  neck sprain from happening again: Practice good posture. Adjust your workstation to help you do this. Exercise regularly as told by your doctor or physical therapist. Avoid activities that are risky or may cause a neck sprain. Contact a doctor if: Your symptoms get worse. Your symptoms do not get better after 2 weeks of treatment. Your pain gets worse. Medicine does not help your pain. You have new symptoms that you cannot explain. Your neck collar gives you sores on your skin or bothers your skin. Get help right away if: You have very bad pain. You get any of the following in any part of your body: Loss of feeling. Tingling. Weakness. You cannot move a part of your body. You have neck pain and either of these: Very bad dizziness. A very bad headache. Summary A cervical sprain is also called a neck  sprain. It is a stretch or tear in one or more ligaments in the neck. Ligaments are tissues that connect bones. Neck sprains may be caused by trauma, such as an injury or a fall. You may get symptoms right away after injury, or you may get them over a few days. Neck sprains may be treated with rest, heat, ice, medicines, exercise, and surgery. This information is not intended to replace advice given to you by your health care provider. Make sure you discuss any questions you have with your health care provider. Document Revised: 11/04/2018 Document Reviewed: 11/04/2018 Elsevier Patient Education  [employer].

## 2020-12-30 ENCOUNTER — Encounter: Payer: Self-pay | Admitting: Nurse Practitioner

## 2021-01-16 ENCOUNTER — Other Ambulatory Visit: Payer: Self-pay

## 2021-01-24 ENCOUNTER — Ambulatory Visit: Payer: Self-pay

## 2021-01-26 ENCOUNTER — Other Ambulatory Visit: Payer: Self-pay

## 2021-01-30 ENCOUNTER — Other Ambulatory Visit: Payer: Self-pay

## 2021-02-16 ENCOUNTER — Other Ambulatory Visit: Payer: Self-pay

## 2021-02-19 ENCOUNTER — Other Ambulatory Visit: Payer: Self-pay

## 2021-02-20 ENCOUNTER — Other Ambulatory Visit: Payer: Self-pay

## 2021-02-21 ENCOUNTER — Other Ambulatory Visit: Payer: Self-pay

## 2021-02-28 ENCOUNTER — Ambulatory Visit: Payer: Self-pay

## 2021-03-07 ENCOUNTER — Encounter: Payer: Self-pay | Admitting: Nurse Practitioner

## 2021-03-07 ENCOUNTER — Other Ambulatory Visit: Payer: Self-pay

## 2021-03-07 ENCOUNTER — Ambulatory Visit (INDEPENDENT_AMBULATORY_CARE_PROVIDER_SITE_OTHER): Payer: Self-pay | Admitting: Nurse Practitioner

## 2021-03-07 ENCOUNTER — Ambulatory Visit (HOSPITAL_COMMUNITY)
Admission: RE | Admit: 2021-03-07 | Discharge: 2021-03-07 | Disposition: A | Discharge status: Home / Self Care | Payer: Self-pay | Source: Ambulatory Visit | Attending: Nurse Practitioner | Admitting: Nurse Practitioner

## 2021-03-07 VITALS — BP 144/72 | HR 80 | Temp 97.7°F | Ht 71.0 in | Wt 247.0 lb

## 2021-03-07 DIAGNOSIS — R051 Acute cough: Secondary | ICD-10-CM | POA: Insufficient documentation

## 2021-03-07 DIAGNOSIS — R042 Hemoptysis: Secondary | ICD-10-CM | POA: Insufficient documentation

## 2021-03-07 NOTE — Patient Instructions (Signed)
You were seen today in the [facility] for cough with bloody sputum. Please continue taking OTC medications as needed.  Please maintain follow up with PCP on 1/19

## 2021-03-07 NOTE — Progress Notes (Signed)
[place], [place]  [address] Phone:  [phone]   Fax:  [phone] Subjective:   Patient ID: Jon Harper, male    DOB: 1955-08-02, 65 y.o.   MRN: [id number]  Chief Complaint  Patient presents with   Acute Visit    Coughing up blood, started yesterday. Feels throat get very dry over night    HPI Jon Harper 65 y.o. male  has a past medical history of Chronic low back pain, Diabetes mellitus, Hypertension, Neck pain, Neck pain, Skin rash (10/2018), and Tooth abscess (10/2018).  To the [facility] for throat dryness and coughing up blood.  Patient states he has had a productive cough with blood tinged sputum x 1 wk. Cough has been improving with OTC medications, but maintains concern for underlying cause of cough.  Has also had throat dryness, which is pronounced at night.   Also concerned about weight loss of 100 lbs over the past year, states that weight loss was unintentional, but he has made several diet changes over the past year at the recommendation of his doctors. States, " I am losing muscle weight, not fat." Requesting referral to [facility] for screening for cancer.   Unemployed and retired. Lives with a roommate. Denies smoking cigarettes, but smokes marijuana regularly to assist with management of chronic musculoskeletal pain. States that when he smokes marijuana, it causes him to cough frequently, questioning if that could be causing his symptoms.   Denies any fever, chest pain or shortness of breath. Denies any other complaints today. Denies any fatigue, chest pain, shortness of breath, HA or dizziness. Denies any blurred vision, numbness or tingling.   Past Medical History:  Diagnosis Date   Chronic low back pain    Diabetes mellitus    Hypertension    Neck pain    Neck pain    Skin rash 10/2018   Tooth abscess 10/2018    Past Surgical History:  Procedure Laterality Date   right nephrectomy      Family  History  Problem Relation Age of Onset   Diabetes Mother        non-insulin    Diabetes Father        non-insulin    Diabetes Sister        non-insulin     Social History   Socioeconomic History   Marital status: Married    Spouse name: Not on file   Number of children: Not on file   Years of education: Not on file   Highest education level: Not on file  Occupational History   Not on file  Tobacco Use   Smoking status: Never   Smokeless tobacco: Never  Vaping Use   Vaping Use: Never used  Substance and Sexual Activity   Alcohol use: No   Drug use: No   Sexual activity: Yes    Birth control/protection: Condom  Other Topics Concern   Not on file  Social History Narrative   Divorced.   Does exercise as much as possible.  Tries to go to gym at least 3 days a week.   Social Determinants of Health   Financial Resource Strain: Not on file  Food Insecurity: Not on file  Transportation Needs: Not on file  Physical Activity: Not on file  Stress: Not on file  Social Connections: Not on file  Intimate Partner Violence: Not on file    Outpatient Medications Prior to Visit  Medication  Sig Dispense Refill   acetaminophen (TYLENOL 8 HOUR) 650 MG CR tablet Take 1 tablet (650 mg total) by mouth every 8 (eight) hours as needed for pain. 20 tablet 0   acetaminophen (TYLENOL) 500 MG tablet Take 1 tablet (500 mg total) by mouth in the morning and at bedtime. 60 tablet 11   atorvastatin (LIPITOR) 40 MG tablet TAKE 1 TABLET (40 MG TOTAL) BY MOUTH DAILY. 90 tablet 3   DULoxetine (CYMBALTA) 30 MG capsule Take 1 capsule (30 mg total) by mouth daily for 7 days, THEN 2 capsules (60 mg total) daily. 60 capsule 11   gabapentin (NEURONTIN) 300 MG capsule TAKE 1 CAPSULE (300 MG TOTAL) BY MOUTH 2 (TWO) TIMES DAILY. 180 capsule 3   glipiZIDE (GLUCOTROL) 10 MG tablet TAKE 1 TABLET (10 MG TOTAL) BY MOUTH 2 (TWO) TIMES DAILY BEFORE A MEAL. 180 tablet 3   hydrocortisone cream 0.5 % Apply 1  application topically 2 (two) times daily. 30 g 3   losartan (COZAAR) 50 MG tablet TAKE 2 TABLETS (100 MG TOTAL) BY MOUTH DAILY. 180 tablet 3   metoprolol tartrate (LOPRESSOR) 25 MG tablet TAKE 1 TABLET (25 MG TOTAL) BY MOUTH 2 (TWO) TIMES DAILY. 180 tablet 3   oxyCODONE (ROXICODONE) 5 MG immediate release tablet Take 0.5-1 tablets (2.5-5 mg total) by mouth every 6 (six) hours as needed for severe pain. 6 tablet 0   tamsulosin (FLOMAX) 0.4 MG CAPS capsule Take 2 capsules (0.8 mg total) by mouth daily. 180 capsule 3   No facility-administered medications prior to visit.    No Known Allergies  Review of Systems  Constitutional:  Positive for weight loss. Negative for chills, fever and malaise/fatigue.  HENT:  Positive for sore throat. Negative for congestion, ear discharge, ear pain, hearing loss, nosebleeds, sinus pain and tinnitus.   Eyes: Negative.   Respiratory:  Positive for cough and hemoptysis. Negative for sputum production, shortness of breath, wheezing and stridor.   Cardiovascular:  Negative for chest pain, palpitations and leg swelling.  Gastrointestinal:  Negative for abdominal pain, blood in stool, constipation, diarrhea, nausea and vomiting.  Genitourinary: Negative.   Musculoskeletal:  Positive for back pain and joint pain. Negative for falls, myalgias and neck pain.  Skin: Negative.   Neurological: Negative.   Psychiatric/Behavioral:  Negative for depression. The patient is not nervous/anxious.   All other systems reviewed and are negative.     Objective:    Physical Exam Vitals reviewed.  Constitutional:      General: He is not in acute distress.    Appearance: Normal appearance.  HENT:     Head: Normocephalic.     Right Ear: Tympanic membrane, ear canal and external ear normal. There is no impacted cerumen.     Left Ear: Tympanic membrane, ear canal and external ear normal. There is no impacted cerumen.     Nose: Nose normal.     Mouth/Throat:     Mouth: Mucous  membranes are moist.     Pharynx: Oropharynx is clear. No oropharyngeal exudate or posterior oropharyngeal erythema.  Eyes:     Extraocular Movements: Extraocular movements intact.     Conjunctiva/sclera: Conjunctivae normal.     Pupils: Pupils are equal, round, and reactive to light.  Neck:     Vascular: No carotid bruit.  Cardiovascular:     Rate and Rhythm: Normal rate and regular rhythm.     Pulses: Normal pulses.     Heart sounds: Normal heart sounds.  Comments: No obvious peripheral edema Pulmonary:     Effort: Pulmonary effort is normal.     Breath sounds: Normal breath sounds.  Musculoskeletal:        General: No swelling, tenderness, deformity or signs of injury. Normal range of motion.     Cervical back: Normal range of motion and neck supple. No rigidity or tenderness.     Right lower leg: No edema.     Left lower leg: No edema.  Lymphadenopathy:     Cervical: No cervical adenopathy.  Skin:    General: Skin is warm and dry.     Capillary Refill: Capillary refill takes less than 2 seconds.  Neurological:     General: No focal deficit present.     Mental Status: He is alert and oriented to person, place, and time.  Psychiatric:        Mood and Affect: Mood normal.        Behavior: Behavior normal.        Thought Content: Thought content normal.        Judgment: Judgment normal.    BP (!) 144/72 (BP Location: Right Arm, Patient Position: Sitting)    Pulse 80    Temp 97.7 F (36.5 C)    Ht _0  (1.803 m)    Wt 247 lb 0.2 oz (112 kg)    SpO2 98%    BMI 34.45 kg/m  Wt Readings from Last 3 Encounters:  03/07/21 247 lb 0.2 oz (112 kg)  12/27/20 246 lb 9.6 oz (111.9 kg)  12/19/20 240 lb (108.9 kg)    Immunization History  Administered Date(s) Administered   Influenza,inj,Quad PF,6+ Mos 05/14/2017, 04/01/2018, 12/27/2020    Diabetic Foot Exam - Simple   No data filed     Lab Results  Component Value Date   TSH 1.170 01/25/2020   Lab Results  Component  Value Date   WBC 10.0 12/19/2020   HGB 13.9 12/19/2020   HCT 41.0 12/19/2020   MCV 81.2 12/19/2020   PLT 155 12/19/2020   Lab Results  Component Value Date   NA 138 12/19/2020   K 4.3 12/19/2020   CO2 24 12/19/2020   GLUCOSE 142 (H) 12/19/2020   BUN 22 12/19/2020   CREATININE 1.33 (H) 12/19/2020   BILITOT 0.3 07/26/2020   ALKPHOS 61 07/26/2020   AST 20 07/26/2020   ALT 42 07/20/2020   PROT 7.2 07/26/2020   ALBUMIN 4.3 07/26/2020   CALCIUM 10.2 12/19/2020   ANIONGAP 9 12/19/2020   EGFR 60 07/26/2020   Lab Results  Component Value Date   CHOL 205 (H) 07/26/2020   CHOL 224 (H) 01/25/2020   CHOL 206 (H) 04/16/2017   Lab Results  Component Value Date   HDL 53 07/26/2020   HDL 60 01/25/2020   HDL 60 04/16/2017   Lab Results  Component Value Date   LDLCALC 142 (H) 07/26/2020   LDLCALC 149 (H) 01/25/2020   LDLCALC 133 (H) 04/16/2017   Lab Results  Component Value Date   TRIG 58 07/26/2020   TRIG 85 01/25/2020   TRIG 66 04/16/2017   Lab Results  Component Value Date   CHOLHDL 3.9 07/26/2020   CHOLHDL 3.7 01/25/2020   CHOLHDL 3.4 04/16/2017   Lab Results  Component Value Date   HGBA1C 7.1 (A) 12/27/2020   HGBA1C 7.1 12/27/2020   HGBA1C 7.1 (A) 12/27/2020   HGBA1C 7.1 (A) 12/27/2020       Assessment & Plan:   Problem  List Items Addressed This Visit   None Visit Diagnoses     Hemoptysis    -  Primary   Relevant Orders   DG Chest 2 View (Completed)   Acute cough       Relevant Orders   DG Chest 2 View (Completed) Patient informed to continue taking OTC as needed for symptoms Will discuss referral further to [facility], with PCP Discussed smoking cessation with patient    Maintain upcoming follow up with PCP on 1/19, sooner as needed    I am having Hosie Poisson maintain his hydrocortisone cream, acetaminophen, losartan, metoprolol tartrate, atorvastatin, gabapentin, glipiZIDE, tamsulosin, acetaminophen, oxyCODONE, and DULoxetine.  No  orders of the defined types were placed in this encounter.    Teena Dunk, NP

## 2021-03-29 ENCOUNTER — Other Ambulatory Visit: Payer: Self-pay

## 2021-03-29 ENCOUNTER — Encounter: Payer: Self-pay | Admitting: Nurse Practitioner

## 2021-03-29 ENCOUNTER — Ambulatory Visit (INDEPENDENT_AMBULATORY_CARE_PROVIDER_SITE_OTHER): Payer: Self-pay | Admitting: Nurse Practitioner

## 2021-03-29 VITALS — BP 147/94 | HR 74 | Temp 97.9°F | Ht 71.0 in | Wt 251.2 lb

## 2021-03-29 DIAGNOSIS — I10 Essential (primary) hypertension: Secondary | ICD-10-CM

## 2021-03-29 DIAGNOSIS — E1169 Type 2 diabetes mellitus with other specified complication: Secondary | ICD-10-CM

## 2021-03-29 DIAGNOSIS — R042 Hemoptysis: Secondary | ICD-10-CM

## 2021-03-29 DIAGNOSIS — E785 Hyperlipidemia, unspecified: Secondary | ICD-10-CM

## 2021-03-29 DIAGNOSIS — E119 Type 2 diabetes mellitus without complications: Secondary | ICD-10-CM

## 2021-03-29 MED ORDER — METOPROLOL TARTRATE 25 MG PO TABS
25.0000 mg | ORAL_TABLET | Freq: Three times a day (TID) | ORAL | 1 refills | Status: DC
  Filled 2021-03-29 – 2021-05-24 (×2): qty 90, 30d supply, fill #0
  Filled 2021-08-05: qty 90, 30d supply, fill #1

## 2021-03-29 NOTE — Progress Notes (Signed)
[place], [place]  [address] Phone:  [phone]   Fax:  [phone]   Established Patient Office Visit  Subjective:  Patient ID: Jon Harper, male    DOB: [birth year]  Age: 66 y.o. MRN: [id number]  CC:  Chief Complaint  Patient presents with   Follow-up    Pt is here today for his 3 month follow up visit. Pt states that he has been having some dizziness x 2 weeks. Pt states that he was seen in December with Ms. Passmore due to him coughing up blood. Chest x-ray was done but everything was normal. Pt would like to be referred to Oncology at [facility] to see if he may have some type of cancer.  Pt states that he has been loosing a lot of weight.    HPI Jon Harper presents for follow up. She  has a past medical history of Chronic low back pain, Diabetes mellitus, Hypertension, Neck pain, Neck pain, Skin rash (10/2018), and Tooth abscess (10/2018).   He is in today for follow-up. He experienced hemoptysis. He has lost 100 pound in the last year. He was not trying to loss this amount. His chest xray was normal . He is concern about cancer. He does smoke marijuana.  He has decreased the amount of THC use. He reports the pain is relieved by THC.  He continues to have pain and is prescribed Cymbalta 30 mg BID, Gabapentin 300 mg BID .  He is following up for DM. He is prescribed glipizide 10 mg BID . His current A1c 7.4 % up from 7.1%.   He has some visual changes. He has a orange card.    He is also following up HTN losartan 50 mg BID and metoprolol 25 mg BID. Denies headache, dizziness, visual changes, shortness of breath, dyspnea on exertion, chest pain, nausea, vomiting or any edema.   He has a son that lives in [place]. He maybe moving out there.   Past Medical History:  Diagnosis Date   Chronic low back pain    Diabetes mellitus    Hypertension    Neck pain    Neck pain    Skin rash 10/2018   Tooth abscess 10/2018     Past Surgical History:  Procedure Laterality Date   right nephrectomy      Family History  Problem Relation Age of Onset   Diabetes Mother        non-insulin    Diabetes Father        non-insulin    Diabetes Sister        non-insulin     Social History   Socioeconomic History   Marital status: Married    Spouse name: Not on file   Number of children: Not on file   Years of education: Not on file   Highest education level: Not on file  Occupational History   Not on file  Tobacco Use   Smoking status: Never   Smokeless tobacco: Never  Vaping Use   Vaping Use: Never used  Substance and Sexual Activity   Alcohol use: No   Drug use: Yes    Types: Marijuana   Sexual activity: Yes    Birth control/protection: Condom  Other Topics Concern   Not on file  Social History Narrative   Divorced.   Does exercise as much as possible.  Tries to go to gym at least 3 days a week.  Social Determinants of Health   Financial Resource Strain: Not on file  Food Insecurity: Not on file  Transportation Needs: Not on file  Physical Activity: Not on file  Stress: Not on file  Social Connections: Not on file  Intimate Partner Violence: Not on file    Outpatient Medications Prior to Visit  Medication Sig Dispense Refill   acetaminophen (TYLENOL 8 HOUR) 650 MG CR tablet Take 1 tablet (650 mg total) by mouth every 8 (eight) hours as needed for pain. 20 tablet 0   acetaminophen (TYLENOL) 500 MG tablet Take 1 tablet (500 mg total) by mouth in the morning and at bedtime. 60 tablet 11   atorvastatin (LIPITOR) 40 MG tablet TAKE 1 TABLET (40 MG TOTAL) BY MOUTH DAILY. 90 tablet 3   DULoxetine (CYMBALTA) 30 MG capsule Take 1 capsule (30 mg total) by mouth daily for 7 days, THEN 2 capsules (60 mg total) daily. 60 capsule 11   gabapentin (NEURONTIN) 300 MG capsule TAKE 1 CAPSULE (300 MG TOTAL) BY MOUTH 2 (TWO) TIMES DAILY. 180 capsule 3   glipiZIDE (GLUCOTROL) 10 MG tablet TAKE 1 TABLET (10  MG TOTAL) BY MOUTH 2 (TWO) TIMES DAILY BEFORE A MEAL. 180 tablet 3   hydrocortisone cream 0.5 % Apply 1 application topically 2 (two) times daily. 30 g 3   losartan (COZAAR) 50 MG tablet TAKE 2 TABLETS (100 MG TOTAL) BY MOUTH DAILY. 180 tablet 3   metoprolol tartrate (LOPRESSOR) 25 MG tablet TAKE 1 TABLET (25 MG TOTAL) BY MOUTH 2 (TWO) TIMES DAILY. 180 tablet 3   oxyCODONE (ROXICODONE) 5 MG immediate release tablet Take 0.5-1 tablets (2.5-5 mg total) by mouth every 6 (six) hours as needed for severe pain. 6 tablet 0   tamsulosin (FLOMAX) 0.4 MG CAPS capsule Take 2 capsules (0.8 mg total) by mouth daily. 180 capsule 3   No facility-administered medications prior to visit.    No Known Allergies  ROS Review of Systems    Objective:    Physical Exam Constitutional:      Appearance: He is obese.  HENT:     Head: Normocephalic and atraumatic.     Nose: Nose normal.     Mouth/Throat:     Mouth: Mucous membranes are moist.  Cardiovascular:     Rate and Rhythm: Normal rate and regular rhythm.     Pulses: Normal pulses.     Heart sounds: Normal heart sounds.  Pulmonary:     Effort: Pulmonary effort is normal.     Breath sounds: Normal breath sounds.  Abdominal:     Palpations: Abdomen is soft.  Musculoskeletal:        General: Normal range of motion.     Cervical back: Normal range of motion.     Right lower leg: No edema.     Left lower leg: No edema.  Skin:    General: Skin is warm and dry.     Capillary Refill: Capillary refill takes less than 2 seconds.  Neurological:     General: No focal deficit present.     Mental Status: He is alert and oriented to person, place, and time.  Psychiatric:        Mood and Affect: Mood normal.        Behavior: Behavior normal.        Thought Content: Thought content normal.        Judgment: Judgment normal.    BP (!) 147/94    Pulse 74  Temp 97.9 F (36.6 C)    Ht 5' 11"  (1.803 m)    Wt 251 lb 3.2 oz (113.9 kg)    SpO2 98%    BMI  35.04 kg/m  Wt Readings from Last 3 Encounters:  03/29/21 251 lb 3.2 oz (113.9 kg)  03/07/21 247 lb 0.2 oz (112 kg)  12/27/20 246 lb 9.6 oz (111.9 kg)     Health Maintenance Due  Topic Date Due   COVID-19 Vaccine (1) Never done   FOOT EXAM  Never done   OPHTHALMOLOGY EXAM  Never done    There are no preventive care reminders to display for this patient.  Lab Results  Component Value Date   TSH 1.170 01/25/2020   Lab Results  Component Value Date   WBC 10.0 12/19/2020   HGB 13.9 12/19/2020   HCT 41.0 12/19/2020   MCV 81.2 12/19/2020   PLT 155 12/19/2020   Lab Results  Component Value Date   NA 138 12/19/2020   K 4.3 12/19/2020   CO2 24 12/19/2020   GLUCOSE 142 (H) 12/19/2020   BUN 22 12/19/2020   CREATININE 1.33 (H) 12/19/2020   BILITOT 0.3 07/26/2020   ALKPHOS 61 07/26/2020   AST 20 07/26/2020   ALT 42 07/20/2020   PROT 7.2 07/26/2020   ALBUMIN 4.3 07/26/2020   CALCIUM 10.2 12/19/2020   ANIONGAP 9 12/19/2020   EGFR 60 07/26/2020   Lab Results  Component Value Date   CHOL 205 (H) 07/26/2020   Lab Results  Component Value Date   HDL 53 07/26/2020   Lab Results  Component Value Date   LDLCALC 142 (H) 07/26/2020   Lab Results  Component Value Date   TRIG 58 07/26/2020   Lab Results  Component Value Date   CHOLHDL 3.9 07/26/2020   Lab Results  Component Value Date   HGBA1C 7.1 (A) 12/27/2020   HGBA1C 7.1 12/27/2020   HGBA1C 7.1 (A) 12/27/2020   HGBA1C 7.1 (A) 12/27/2020      Assessment & Plan:   Problem List Items Addressed This Visit       Endocrine   Type 2 diabetes mellitus without complication, without long-term current use of insulin (HCC) - Primary Stable  Encourage compliance with current treatment regimen   Encourage regular CBG monitoring Encourage contacting office if excessive hyperglycemia and or hypoglycemia Lifestyle modification with healthy diet (fewer calories, more high fiber foods, whole grains and non-starchy  vegetables, lower fat meat and fish, low-fat diary include healthy oils) regular exercise (physical activity) and weight loss Opthalmology exam discussed  Nutritional consult recommended Regular dental visits encouraged Home BP monitoring also encouraged goal <130/80    Relevant Orders   HgB A1c   Other Visit Diagnoses     Hemoptysis     Referral has been made to pulmonology per patient request   Hypertension, unspecified type     Increased the metoprolol 25 mg TID  Encouraged on going compliance with current medication regimen Encouraged home monitoring and recording BP <130/80 Eating a heart-healthy diet with less salt Encouraged regular physical activity  Recommend Weight loss         No orders of the defined types were placed in this encounter.   Follow-up: No follow-ups on file.    Vevelyn Francois, NP

## 2021-03-29 NOTE — Patient Instructions (Signed)
Hemoptysis Hemoptysis is when you cough up blood. If it is mild, you may cough up small amounts of bloody spit and mucus from your lungs. If it is very bad, you may cough up a lot of blood. If you cough up 1-2 cups (240-480 mL) of blood within 24 hours, get help right away. Common causes of mild (nonmassive) hemoptysis include: Infection in your nose, throat, or lungs. Nosebleeds. Breathing in an object that is not meant to be inside your body. Common causes of very bad (massive) hemoptysis include: Tuberculosis (TB). A tumor in the lungs or upper airway. A blood clot in the lungs. Stomach bleeding or ulcer. Taking blood thinners. Having problems with blood clotting. Sometimes, the cause is not known. Follow these instructions at home: Medicines If you were prescribed an antibiotic medicine, take it as told by your doctor. Do not stop taking it even if you start to feel better. Take over-the-counter and prescription medicines only as told by your doctor. General instructions  Do not smoke or use any products that contain nicotine or tobacco. If you need help quitting, ask your doctor. Return to your normal activities when your doctor says that it is safe. Ask your doctor if it is okay for you to exercise or go on an airplane. Keep all follow-up visits. Contact a doctor if: You have a fever over 100.73F (38C). You cough up more blood than before. The blood looks brighter or darker red. Get help right away if: You cough up fresh blood or blood clots. You cough up 1-2 cups (240-480 mL) of blood in 24 hours. You have trouble breathing. You feel like you are choking. You have chest pain. You feel dizzy or light-headed. These symptoms may be an emergency. Get help right away. Call 911. Do not wait to see if the symptoms will go away. Do not drive yourself to the hospital. Summary Hemoptysis is coughing up blood. If you cough up 1-2 cups (240-480 mL) of blood in 24 hours, get help  right away. Do not smoke or use any products that contain nicotine or tobacco. If you need help quitting, ask your doctor. This information is not intended to replace advice given to you by your health care provider. Make sure you discuss any questions you have with your health care provider. Document Revised: 10/02/2020 Document Reviewed: 10/02/2020 Elsevier Patient Education  [place].

## 2021-03-30 ENCOUNTER — Encounter: Payer: Self-pay | Admitting: Nurse Practitioner

## 2021-03-30 LAB — COMP. METABOLIC PANEL (12)
AST: 32 IU/L (ref 0–40)
Albumin/Globulin Ratio: 1.3 (ref 1.2–2.2)
Albumin: 4.6 g/dL (ref 3.8–4.8)
Alkaline Phosphatase: 81 IU/L (ref 44–121)
BUN/Creatinine Ratio: 14 (ref 10–24)
BUN: 19 mg/dL (ref 8–27)
Bilirubin Total: 0.4 mg/dL (ref 0.0–1.2)
Calcium: 10.1 mg/dL (ref 8.6–10.2)
Chloride: 106 mmol/L (ref 96–106)
Creatinine, Ser: 1.32 mg/dL — ABNORMAL HIGH (ref 0.76–1.27)
Globulin, Total: 3.6 g/dL (ref 1.5–4.5)
Glucose: 150 mg/dL — ABNORMAL HIGH (ref 70–99)
Potassium: 4.8 mmol/L (ref 3.5–5.2)
Sodium: 142 mmol/L (ref 134–144)
Total Protein: 8.2 g/dL (ref 6.0–8.5)
eGFR: 61 mL/min/{1.73_m2} (ref >59)

## 2021-03-30 LAB — LIPID PANEL
Chol/HDL Ratio: 2.2 ratio (ref 0.0–5.0)
Cholesterol, Total: 141 mg/dL (ref 100–199)
HDL: 64 mg/dL (ref >39)
LDL Chol Calc (NIH): 66 mg/dL (ref 0–99)
Triglycerides: 46 mg/dL (ref 0–149)
VLDL Cholesterol Cal: 11 mg/dL (ref 5–40)

## 2021-03-30 LAB — POCT GLYCOSYLATED HEMOGLOBIN (HGB A1C): HbA1c POC (<> result, manual entry): 7.4 % (ref 4.0–5.6)

## 2021-04-06 ENCOUNTER — Telehealth: Payer: Self-pay

## 2021-04-06 NOTE — Telephone Encounter (Signed)
Left message to return call to our office.  

## 2021-04-06 NOTE — Telephone Encounter (Signed)
-----   Message from Shirley Muscat, [place] sent at 04/04/2021 12:24 PM EST -----  ----- Message ----- From: Vevelyn Francois, NP Sent: 03/30/2021   6:31 AM EST To: Shirley Muscat, CMA  Overall your labs are stable.  There are no current changes needed to your treatment plan.

## 2021-05-01 ENCOUNTER — Other Ambulatory Visit: Payer: Self-pay

## 2021-05-02 ENCOUNTER — Other Ambulatory Visit: Payer: Self-pay

## 2021-05-11 ENCOUNTER — Other Ambulatory Visit: Payer: Self-pay

## 2021-05-11 ENCOUNTER — Ambulatory Visit (INDEPENDENT_AMBULATORY_CARE_PROVIDER_SITE_OTHER): Payer: Self-pay | Admitting: Nurse Practitioner

## 2021-05-11 ENCOUNTER — Encounter: Payer: Self-pay | Admitting: Nurse Practitioner

## 2021-05-11 VITALS — BP 142/91 | HR 75 | Temp 98.1°F | Ht 71.0 in | Wt 253.6 lb

## 2021-05-11 DIAGNOSIS — R7989 Other specified abnormal findings of blood chemistry: Secondary | ICD-10-CM

## 2021-05-11 DIAGNOSIS — I10 Essential (primary) hypertension: Secondary | ICD-10-CM

## 2021-05-11 DIAGNOSIS — E119 Type 2 diabetes mellitus without complications: Secondary | ICD-10-CM

## 2021-05-11 NOTE — Progress Notes (Signed)
? ?[place] ?[place], [place]  [address] ?Phone:  [phone]   Fax:  [phone] ? ? ?Established Patient Office Visit ? ?Subjective:  ?Patient ID: Jon Harper, male    DOB: 09-Nov-1955  Age: 66 y.o. MRN: [id number] ? ?CC:  ?Chief Complaint  ?Patient presents with  ? Follow-up  ?  Pt is here today for his 1 month follow up visit. ?Pt states that he has been having some dizziness and lightheaded since he last visit. Pt is also needing medication refills. ?Pt states that his back and feet having been bothering him as well. ?  ? ? ?HPI ?Jon Harper presents for follow up. He  has a past medical history of Chronic low back pain, Diabetes mellitus, Hypertension, Neck pain, Neck pain, Skin rash (10/2018), and Tooth abscess (10/2018).  ? ?Jon Harper is in today for follow up for Hypertension. The current prescribed treatment is HTN losartan 50 mg BID and metoprolol 25 mg BID. Compliance is reported. The  DASH diet is being followed. An exercise regimen is not  ongoing. There is a goal to is to maintain. Denies headache, dizziness, visual changes, shortness of breath, dyspnea on exertion, chest pain, nausea, vomiting. ? ?He continues to have the dizziness . He reports a history of allergies. He is not on any current treatment. He denies headache.  ? ?He does continue to have pain in his back and ankle. ?Past Medical History:  ?Diagnosis Date  ? Chronic low back pain   ? Diabetes mellitus   ? Hypertension   ? Neck pain   ? Neck pain   ? Skin rash 10/2018  ? Tooth abscess 10/2018  ? ? ?Past Surgical History:  ?Procedure Laterality Date  ? right nephrectomy    ? ? ?Family History  ?Problem Relation Age of Onset  ? Diabetes Mother   ?     non-insulin   ? Diabetes Father   ?     non-insulin   ? Diabetes Sister   ?     non-insulin   ? ? ?Social History  ? ?Socioeconomic History  ? Marital status: Married  ?  Spouse name: Not on file  ? Number of children: Not on file  ? Years of education:  Not on file  ? Highest education level: Not on file  ?Occupational History  ? Not on file  ?Tobacco Use  ? Smoking status: Never  ? Smokeless tobacco: Never  ?Vaping Use  ? Vaping Use: Never used  ?Substance and Sexual Activity  ? Alcohol use: No  ? Drug use: Yes  ?  Types: Marijuana  ?  Comment: occ  ? Sexual activity: Yes  ?  Birth control/protection: Condom  ?Other Topics Concern  ? Not on file  ?Social History Narrative  ? Divorced.  ? Does exercise as much as possible.  Tries to go to gym at least 3 days a week.  ? ?Social Determinants of Health  ? ?Financial Resource Strain: Not on file  ?Food Insecurity: Not on file  ?Transportation Needs: Not on file  ?Physical Activity: Not on file  ?Stress: Not on file  ?Social Connections: Not on file  ?Intimate Partner Violence: Not on file  ? ? ?Outpatient Medications Prior to Visit  ?Medication Sig Dispense Refill  ? acetaminophen (TYLENOL 8 HOUR) 650 MG CR tablet Take 1 tablet (650 mg total) by mouth every 8 (eight) hours as needed for pain. 20 tablet 0  ?  acetaminophen (TYLENOL) 500 MG tablet Take 1 tablet (500 mg total) by mouth in the morning and at bedtime. 60 tablet 11  ? atorvastatin (LIPITOR) 40 MG tablet TAKE 1 TABLET (40 MG TOTAL) BY MOUTH DAILY. 90 tablet 3  ? DULoxetine (CYMBALTA) 30 MG capsule Take 1 capsule (30 mg total) by mouth daily for 7 days, THEN 2 capsules (60 mg total) daily. 60 capsule 11  ? gabapentin (NEURONTIN) 300 MG capsule TAKE 1 CAPSULE (300 MG TOTAL) BY MOUTH 2 (TWO) TIMES DAILY. 180 capsule 3  ? glipiZIDE (GLUCOTROL) 10 MG tablet TAKE 1 TABLET (10 MG TOTAL) BY MOUTH 2 (TWO) TIMES DAILY BEFORE A MEAL. 180 tablet 3  ? hydrocortisone cream 0.5 % Apply 1 application topically 2 (two) times daily. 30 g 3  ? losartan (COZAAR) 50 MG tablet TAKE 2 TABLETS (100 MG TOTAL) BY MOUTH DAILY. 180 tablet 3  ? metoprolol tartrate (LOPRESSOR) 25 MG tablet Take 1 tablet (25 mg total) by mouth in the morning, at noon, and at bedtime. 90 tablet 1  ?  oxyCODONE (ROXICODONE) 5 MG immediate release tablet Take 0.5-1 tablets (2.5-5 mg total) by mouth every 6 (six) hours as needed for severe pain. 6 tablet 0  ? tamsulosin (FLOMAX) 0.4 MG CAPS capsule Take 2 capsules (0.8 mg total) by mouth daily. 180 capsule 3  ? ?No facility-administered medications prior to visit.  ? ? ?No Known Allergies ? ?ROS ?Review of Systems ? ?  ?Objective:  ?  ?Physical Exam ?Constitutional:   ?   Appearance: He is obese.  ?HENT:  ?   Head: Normocephalic and atraumatic.  ?Cardiovascular:  ?   Rate and Rhythm: Normal rate and regular rhythm.  ?   Pulses: Normal pulses.  ?   Heart sounds: Normal heart sounds.  ?Pulmonary:  ?   Effort: Pulmonary effort is normal.  ?   Breath sounds: Normal breath sounds.  ?   Comments: Auditory wheeze  ?Musculoskeletal:     ?   General: Normal range of motion.  ?   Cervical back: Normal range of motion.  ?Skin: ?   General: Skin is warm.  ?   Capillary Refill: Capillary refill takes less than 2 seconds.  ?Neurological:  ?   Mental Status: He is alert.  ? ? ?BP (!) 142/91   Pulse 75   Temp 98.1 ?F (36.7 ?C)   Ht 5' 11" (1.803 m)   Wt 253 lb 9.6 oz (115 kg)   SpO2 98%   BMI 35.37 kg/m?  ?Wt Readings from Last 3 Encounters:  ?05/11/21 253 lb 9.6 oz (115 kg)  ?03/29/21 251 lb 3.2 oz (113.9 kg)  ?03/07/21 247 lb 0.2 oz (112 kg)  ? ? ? ?Health Maintenance Due  ?Topic Date Due  ? COVID-19 Vaccine (1) Never done  ? FOOT EXAM  Never done  ? OPHTHALMOLOGY EXAM  Never done  ? Zoster Vaccines- Shingrix (1 of 2) Never done  ? ? ?There are no preventive care reminders to display for this patient. ? ?Lab Results  ?Component Value Date  ? TSH 1.170 01/25/2020  ? ?Lab Results  ?Component Value Date  ? WBC 10.0 12/19/2020  ? HGB 13.9 12/19/2020  ? HCT 41.0 12/19/2020  ? MCV 81.2 12/19/2020  ? PLT 155 12/19/2020  ? ?Lab Results  ?Component Value Date  ? NA 142 03/29/2021  ? K 4.8 03/29/2021  ? CO2 24 12/19/2020  ? GLUCOSE 150 (H) 03/29/2021  ? BUN 19 03/29/2021  ?  CREATININE 1.32 (H) 03/29/2021  ? BILITOT 0.4 03/29/2021  ? ALKPHOS 81 03/29/2021  ? AST 32 03/29/2021  ? ALT 42 07/20/2020  ? PROT 8.2 03/29/2021  ? ALBUMIN 4.6 03/29/2021  ? CALCIUM 10.1 03/29/2021  ? ANIONGAP 9 12/19/2020  ? EGFR 61 03/29/2021  ? ?Lab Results  ?Component Value Date  ? CHOL 141 03/29/2021  ? ?Lab Results  ?Component Value Date  ? HDL 64 03/29/2021  ? ?Lab Results  ?Component Value Date  ? [place] 66 03/29/2021  ? ?Lab Results  ?Component Value Date  ? TRIG 46 03/29/2021  ? ?Lab Results  ?Component Value Date  ? CHOLHDL 2.2 03/29/2021  ? ?Lab Results  ?Component Value Date  ? HGBA1C 7.4 03/30/2021  ? ? ?  ?Assessment & Plan:  ? ?Problem List Items Addressed This Visit   ? ?  ? Endocrine  ? Type 2 diabetes mellitus without complication, without long-term current use of insulin ([place]) ?Encourage compliance with current treatment regimen   ?Encourage regular CBG monitoring ?Encourage contacting office if excessive hyperglycemia and or hypoglycemia ?Lifestyle modification with healthy diet (fewer calories, more high fiber foods, whole grains and non-starchy vegetables, lower fat meat and fish, low-fat diary include healthy oils) regular exercise (physical activity) and weight loss ?Opthalmology exam discussed  ?Nutritional consult recommended ?Regular dental visits encouraged ?Home BP monitoring also encouraged goal <130/80 ?  ? Relevant Orders  ? Ambulatory referral to Ophthalmology  ? ?Other Visit Diagnoses   ? ? Hypertension, unspecified type    -  Primary ?Encouraged on going compliance with current medication regimen ?Encouraged home monitoring and recording BP <130/80 ?Eating a heart-healthy diet with less salt ?Encouraged regular physical activity  ?Recommend Weight loss ? ?  ? Elevated serum creatinine      ? ?  ? ? ?No orders of the defined types were placed in this encounter. ? ? ?Follow-up: Return in about 3 months (around 08/11/2021) for follow up [id number].  ? ? ?Vevelyn Francois, NP ?

## 2021-05-11 NOTE — Patient Instructions (Signed)
Managing Your Hypertension Hypertension, also called high blood pressure, is when the force of the blood pressing against the walls of the arteries is too strong. Arteries are blood vessels that carry blood from your heart throughout your body. Hypertension forces the heart to work harder to pump blood and may cause the arteries to become narrow or stiff. Understanding blood pressure readings Your personal target blood pressure may vary depending on your medical conditions, your age, and other factors. A blood pressure reading includes a higher number over a lower number. Ideally, your blood pressure should be below 120/80. You should know that: The first, or top, number is called the systolic pressure. It is a measure of the pressure in your arteries as your heart beats. The second, or bottom number, is called the diastolic pressure. It is a measure of the pressure in your arteries as the heart relaxes. Blood pressure is classified into four stages. Based on your blood pressure reading, your health care provider may use the following stages to determine what type of treatment you need, if any. Systolic pressure and diastolic pressure are measured in a unit called mmHg. Normal Systolic pressure: below 120. Diastolic pressure: below 80. Elevated Systolic pressure: 120-129. Diastolic pressure: below 80. Hypertension stage 1 Systolic pressure: 130-139. Diastolic pressure: 80-89. Hypertension stage 2 Systolic pressure: 140 or above. Diastolic pressure: 90 or above. How can this condition affect me? Managing your hypertension is an important responsibility. Over time, hypertension can damage the arteries and decrease blood flow to important parts of the body, including the brain, heart, and kidneys. Having untreated or uncontrolled hypertension can lead to: A heart attack. A stroke. A weakened blood vessel (aneurysm). Heart failure. Kidney damage. Eye damage. Metabolic syndrome. Memory and  concentration problems. Vascular dementia. What actions can I take to manage this condition? Hypertension can be managed by making lifestyle changes and possibly by taking medicines. Your health care provider will help you make a plan to bring your blood pressure within a normal range. Nutrition  Eat a diet that is high in fiber and potassium, and low in salt (sodium), added sugar, and fat. An example eating plan is called the Dietary Approaches to Stop Hypertension (DASH) diet. To eat this way: Eat plenty of fresh fruits and vegetables. Try to fill one-half of your plate at each meal with fruits and vegetables. Eat whole grains, such as whole-wheat pasta, brown rice, or whole-grain bread. Fill about one-fourth of your plate with whole grains. Eat low-fat dairy products. Avoid fatty cuts of meat, processed or cured meats, and poultry with skin. Fill about one-fourth of your plate with lean proteins such as fish, chicken without skin, beans, eggs, and tofu. Avoid pre-made and processed foods. These tend to be higher in sodium, added sugar, and fat. Reduce your daily sodium intake. Most people with hypertension should eat less than 1,500 mg of sodium a day. Lifestyle  Work with your health care provider to maintain a healthy body weight or to lose weight. Ask what an ideal weight is for you. Get at least 30 minutes of exercise that causes your heart to beat faster (aerobic exercise) most days of the week. Activities may include walking, swimming, or biking. Include exercise to strengthen your muscles (resistance exercise), such as weight lifting, as part of your weekly exercise routine. Try to do these types of exercises for 30 minutes at least 3 days a week. Do not use any products that contain nicotine or tobacco, such as cigarettes, e-cigarettes,   and chewing tobacco. If you need help quitting, ask your health care provider. Control any long-term (chronic) conditions you have, such as high  cholesterol or diabetes. Identify your sources of stress and find ways to manage stress. This may include meditation, deep breathing, or making time for fun activities. Alcohol use Do not drink alcohol if: Your health care provider tells you not to drink. You are pregnant, may be pregnant, or are planning to become pregnant. If you drink alcohol: Limit how much you use to: 0-1 drink a day for women. 0-2 drinks a day for men. Be aware of how much alcohol is in your drink. In the [place] one drink equals one 12 oz bottle of beer (355 mL), one 5 oz glass of wine (148 mL), or one 1 oz glass of hard liquor (44 mL). Medicines Your health care provider may prescribe medicine if lifestyle changes are not enough to get your blood pressure under control and if: Your systolic blood pressure is 130 or higher. Your diastolic blood pressure is 80 or higher. Take medicines only as told by your health care provider. Follow the directions carefully. Blood pressure medicines must be taken as told by your health care provider. The medicine does not work as well when you skip doses. Skipping doses also puts you at risk for problems. Monitoring Before you monitor your blood pressure: Do not smoke, drink caffeinated beverages, or exercise within 30 minutes before taking a measurement. Use the bathroom and empty your bladder (urinate). Sit quietly for at least 5 minutes before taking measurements. Monitor your blood pressure at home as told by your health care provider. To do this: Sit with your back straight and supported. Place your feet flat on the floor. Do not cross your legs. Support your arm on a flat surface, such as a table. Make sure your upper arm is at heart level. Each time you measure, take two or three readings one minute apart and record the results. You may also need to have your blood pressure checked regularly by your health care provider. General information Talk with your health care  provider about your diet, exercise habits, and other lifestyle factors that may be contributing to hypertension. Review all the medicines you take with your health care provider because there may be side effects or interactions. Keep all visits as told by your health care provider. Your health care provider can help you create and adjust your plan for managing your high blood pressure. Where to find more information National Heart, Lung, and Blood Institute: www.nhlbi.nih.gov American Heart Association: www.heart.org Contact a health care provider if: You think you are having a reaction to medicines you have taken. You have repeated (recurrent) headaches. You feel dizzy. You have swelling in your ankles. You have trouble with your vision. Get help right away if: You develop a severe headache or confusion. You have unusual weakness or numbness, or you feel faint. You have severe pain in your chest or abdomen. You vomit repeatedly. You have trouble breathing. These symptoms may represent a serious problem that is an emergency. Do not wait to see if the symptoms will go away. Get medical help right away. Call your local emergency services (911 in the [place] Do not drive yourself to the hospital. Summary Hypertension is when the force of blood pumping through your arteries is too strong. If this condition is not controlled, it may put you at risk for serious complications. Your personal target blood pressure may vary depending on   your medical conditions, your age, and other factors. For most people, a normal blood pressure is less than 120/80. Hypertension is managed by lifestyle changes, medicines, or both. Lifestyle changes to help manage hypertension include losing weight, eating a healthy, low-sodium diet, exercising more, stopping smoking, and limiting alcohol. This information is not intended to replace advice given to you by your health care provider. Make sure you discuss any questions  you have with your health care provider. Document Revised: 03/15/2019 Document Reviewed: 01/26/2019 Elsevier Patient Education  2022 Elsevier Inc.  

## 2021-05-25 ENCOUNTER — Other Ambulatory Visit: Payer: Self-pay

## 2021-05-28 ENCOUNTER — Other Ambulatory Visit: Payer: Self-pay

## 2021-06-21 ENCOUNTER — Other Ambulatory Visit: Payer: Self-pay

## 2021-06-21 ENCOUNTER — Emergency Department (HOSPITAL_BASED_OUTPATIENT_CLINIC_OR_DEPARTMENT_OTHER)
Admission: EM | Admit: 2021-06-21 | Discharge: 2021-06-21 | Disposition: A | Discharge status: Home / Self Care | Payer: Self-pay | Attending: Emergency Medicine | Admitting: Emergency Medicine

## 2021-06-21 ENCOUNTER — Encounter (HOSPITAL_BASED_OUTPATIENT_CLINIC_OR_DEPARTMENT_OTHER): Payer: Self-pay

## 2021-06-21 ENCOUNTER — Emergency Department (HOSPITAL_BASED_OUTPATIENT_CLINIC_OR_DEPARTMENT_OTHER): Payer: Self-pay

## 2021-06-21 DIAGNOSIS — R079 Chest pain, unspecified: Secondary | ICD-10-CM

## 2021-06-21 DIAGNOSIS — R052 Subacute cough: Secondary | ICD-10-CM

## 2021-06-21 DIAGNOSIS — F1290 Cannabis use, unspecified, uncomplicated: Secondary | ICD-10-CM

## 2021-06-21 DIAGNOSIS — E1122 Type 2 diabetes mellitus with diabetic chronic kidney disease: Secondary | ICD-10-CM | POA: Insufficient documentation

## 2021-06-21 DIAGNOSIS — Z79899 Other long term (current) drug therapy: Secondary | ICD-10-CM | POA: Insufficient documentation

## 2021-06-21 DIAGNOSIS — Z7984 Long term (current) use of oral hypoglycemic drugs: Secondary | ICD-10-CM | POA: Insufficient documentation

## 2021-06-21 DIAGNOSIS — N183 Chronic kidney disease, stage 3 unspecified: Secondary | ICD-10-CM | POA: Insufficient documentation

## 2021-06-21 DIAGNOSIS — R0789 Other chest pain: Secondary | ICD-10-CM | POA: Insufficient documentation

## 2021-06-21 DIAGNOSIS — I129 Hypertensive chronic kidney disease with stage 1 through stage 4 chronic kidney disease, or unspecified chronic kidney disease: Secondary | ICD-10-CM | POA: Insufficient documentation

## 2021-06-21 LAB — CBC
HCT: 39.4 % (ref 39.0–52.0)
Hemoglobin: 13.2 g/dL (ref 13.0–17.0)
MCH: 27.7 pg (ref 26.0–34.0)
MCHC: 33.5 g/dL (ref 30.0–36.0)
MCV: 82.6 fL (ref 80.0–100.0)
Platelets: 144 10*3/uL — ABNORMAL LOW (ref 150–400)
RBC: 4.77 MIL/uL (ref 4.22–5.81)
RDW: 13.8 % (ref 11.5–15.5)
WBC: 8.1 10*3/uL (ref 4.0–10.5)
nRBC: 0 % (ref 0.0–0.2)

## 2021-06-21 LAB — BASIC METABOLIC PANEL
Anion gap: 11 (ref 5–15)
BUN: 19 mg/dL (ref 8–23)
CO2: 23 mmol/L (ref 22–32)
Calcium: 9.5 mg/dL (ref 8.9–10.3)
Chloride: 103 mmol/L (ref 98–111)
Creatinine, Ser: 1.51 mg/dL — ABNORMAL HIGH (ref 0.61–1.24)
GFR, Estimated: 51 mL/min — ABNORMAL LOW (ref >60)
Glucose, Bld: 244 mg/dL — ABNORMAL HIGH (ref 70–99)
Potassium: 4.2 mmol/L (ref 3.5–5.1)
Sodium: 137 mmol/L (ref 135–145)

## 2021-06-21 LAB — TROPONIN I (HIGH SENSITIVITY): Troponin I (High Sensitivity): 13 ng/L (ref <18)

## 2021-06-21 NOTE — ED Provider Notes (Signed)
?[facility] ?Provider Note ? ? ?CSN: [id number] ?Arrival date & time: 06/21/21  1325 ? ?  ? ?History ? ?Chief Complaint  ?Patient presents with  ? Chest Pain  ? ? ?Jon Harper is a 66 y.o. male with history of hypertension, hyperlipidemia, type 2 diabetes, stage III chronic kidney disease, chronic lower back pain who presents the emergency department for intermittent chest pain for the past 2 weeks.  He describes his pain is worse on the right side of his chest, and intermittently feels like a pressure that only lasts for several seconds.  Patient states that his symptoms are made worse with bending over and twisting.  He denies any injury to the chest.  States he has some shortness of breath with heavy coughing, that he attributes to smoking marijuana.  No fever, chills, congestion, or other infectious symptoms. ? ?Chest Pain ?Associated symptoms: cough and shortness of breath   ?Associated symptoms: no abdominal pain, no fever, no nausea, no palpitations, no vomiting and no weakness   ? ?  ? ?Home Medications ?Prior to Admission medications   ?Medication Sig Start Date End Date Taking? Authorizing Provider  ?acetaminophen (TYLENOL 8 HOUR) 650 MG CR tablet Take 1 tablet (650 mg total) by mouth every 8 (eight) hours as needed for pain. 09/20/20   Suzy Bouchard, PA-C  ?acetaminophen (TYLENOL) 500 MG tablet Take 1 tablet (500 mg total) by mouth in the morning and at bedtime. 01/25/20   Azzie Glatter, FNP  ?atorvastatin (LIPITOR) 40 MG tablet TAKE 1 TABLET (40 MG TOTAL) BY MOUTH DAILY. 07/26/20 08/30/21  Vevelyn Francois, NP  ?DULoxetine (CYMBALTA) 30 MG capsule Take 1 capsule (30 mg total) by mouth daily for 7 days, THEN 2 capsules (60 mg total) daily. 12/27/20 01/03/22  Vevelyn Francois, NP  ?gabapentin (NEURONTIN) 300 MG capsule TAKE 1 CAPSULE (300 MG TOTAL) BY MOUTH 2 (TWO) TIMES DAILY. 07/26/20 08/23/21  Vevelyn Francois, NP  ?glipiZIDE (GLUCOTROL) 10 MG tablet TAKE 1 TABLET (10 MG  TOTAL) BY MOUTH 2 (TWO) TIMES DAILY BEFORE A MEAL. 07/26/20 08/23/21  Vevelyn Francois, NP  ?hydrocortisone cream 0.5 % Apply 1 application topically 2 (two) times daily. 10/26/18   Azzie Glatter, FNP  ?losartan (COZAAR) 50 MG tablet TAKE 2 TABLETS (100 MG TOTAL) BY MOUTH DAILY. 07/26/20 08/23/21  Vevelyn Francois, NP  ?metoprolol tartrate (LOPRESSOR) 25 MG tablet Take 1 tablet (25 mg total) by mouth in the morning, at noon, and at bedtime. 03/29/21 07/27/21  Vevelyn Francois, NP  ?oxyCODONE (ROXICODONE) 5 MG immediate release tablet Take 0.5-1 tablets (2.5-5 mg total) by mouth every 6 (six) hours as needed for severe pain. 10/15/20   Margarita Mail, PA-C  ?tamsulosin (FLOMAX) 0.4 MG CAPS capsule Take 2 capsules (0.8 mg total) by mouth daily. 07/26/20 07/26/21  Vevelyn Francois, NP  ?   ? ?Allergies    ?Patient has no known allergies.   ? ?Review of Systems   ?Review of Systems  ?Constitutional:  Negative for fever.  ?HENT:  Negative for congestion.   ?Respiratory:  Positive for cough and shortness of breath. Negative for wheezing.   ?Cardiovascular:  Positive for chest pain. Negative for palpitations and leg swelling.  ?Gastrointestinal:  Negative for abdominal pain, nausea and vomiting.  ?Neurological:  Negative for syncope and weakness.  ?All other systems reviewed and are negative. ? ?Physical Exam ?Updated Vital Signs ?BP (!) 179/81   Pulse 84   Temp  99 ?F (37.2 ?C) (Oral)   Resp (!) 25   Ht '5\' 11"'$  (1.803 m)   Wt 113.4 kg   SpO2 99%   BMI 34.87 kg/m?  ?Physical Exam ?Vitals and nursing note reviewed.  ?Constitutional:   ?   Appearance: Normal appearance.  ?HENT:  ?   Head: Normocephalic and atraumatic.  ?Eyes:  ?   Conjunctiva/sclera: Conjunctivae normal.  ?Cardiovascular:  ?   Rate and Rhythm: Normal rate and regular rhythm.  ?Pulmonary:  ?   Effort: Pulmonary effort is normal. No respiratory distress.  ?   Breath sounds: Normal breath sounds.  ?Chest:  ?   Comments: Pain is reproducible to palpation of the right  chest wall, and rotating movement towards the patient's right side.  No deformities palpated. ?Abdominal:  ?   General: There is no distension.  ?   Palpations: Abdomen is soft.  ?   Tenderness: There is no abdominal tenderness.  ?Musculoskeletal:  ?   Right lower leg: No edema.  ?   Left lower leg: No edema.  ?Skin: ?   General: Skin is warm and dry.  ?Neurological:  ?   General: No focal deficit present.  ?   Mental Status: He is alert.  ? ? ?ED Results / Procedures / Treatments   ?Labs ?(all labs ordered are listed, but only abnormal results are displayed) ?Labs Reviewed  ?BASIC METABOLIC PANEL - Abnormal; Notable for the following components:  ?    Result Value  ? Glucose, Bld 244 (*)   ? Creatinine, Ser 1.51 (*)   ? GFR, Estimated 51 (*)   ? All other components within normal limits  ?CBC - Abnormal; Notable for the following components:  ? Platelets 144 (*)   ? All other components within normal limits  ?TROPONIN I (HIGH SENSITIVITY)  ?TROPONIN I (HIGH SENSITIVITY)  ? ? ?EKG ?EKG Interpretation ? ?Date/Time:  Thursday June 21 2021 13:37:18 EDT ?Ventricular Rate:  92 ?PR Interval:  184 ?QRS Duration: 74 ?QT Interval:  356 ?QTC Calculation: 440 ?R Axis:   31 ?Text Interpretation: Sinus rhythm with occasional Premature ventricular complexes Otherwise normal ECG When compared with ECG of 19-Dec-2020 16:49, Sinus rhythm has replaced Ectopic atrial rhythm Confirmed by Fredia Sorrow [phone]) on 06/21/2021 1:48:17 PM ? ?Radiology ?DG Chest Port 1 View ? ?Result Date: 06/21/2021 ?CLINICAL DATA:  Chest pain EXAM: PORTABLE CHEST 1 VIEW COMPARISON:  03/07/2021 FINDINGS: The heart size and mediastinal contours are within normal limits. Both lungs are clear. The visualized skeletal structures are unremarkable. IMPRESSION: No active disease. Electronically Signed   By: Elmer Picker M.D.   On: 06/21/2021 14:22   ? ?Procedures ?Procedures  ? ? ?Medications Ordered in ED ?Medications - No data to display ? ?ED Course/  Medical Decision Making/ A&P ?  ?                        ?Medical Decision Making ?Amount and/or Complexity of Data Reviewed ?Labs: ordered. ?Radiology: ordered. ? ? ?This patient presents to the ED for concern of chest pain, this involves an extensive number of treatment options, and is a complaint that carries with it a high risk of complications and morbidity. The emergent differential diagnosis prior to evaluation includes, but is not limited to,  ACS, pericarditis, aortic dissection, PE, pneumothorax, esophageal spasm or rupture, chronic angina, valvular disease, cardiomyopathy, myocarditis, pulmonary HTN, pneumonia, bronchitis, GERD, reflux/PUD, biliary disease, pancreatitis, disk disease or arthritis, costochondritis,  anxiety or panic attack, herpes zoster, breast disorders, chest wall tumors.  This is not an exhaustive differential.  ? ?Past Medical History / Co-morbidities / Social History: ?hypertension, hyperlipidemia, type 2 diabetes, stage III chronic kidney disease, chronic lower back pain  ? ?Physical Exam: ?Physical exam performed. The pertinent findings include: Patient is afebrile, not tachycardic, not hypoxic, no acute distress.  Lung sounds clear to auscultation all fields.  Pain is reproducible to palpation of the right chest wall, and rotating movement towards the patient's right side. ? ?Lab Tests: ?I ordered, and personally interpreted labs.  The pertinent results include: No leukocytosis, normal hemoglobin.  Electrolytes grossly within normal limits.  Glucose of 244, and creatinine appears stable compared to prior.  Troponin of 13. ?  ?Imaging Studies: ?I ordered imaging studies including chest x-ray. I independently visualized and interpreted imaging which showed no acute cardiopulmonary abnormalities. I agree with the radiologist interpretation. ?  ?Cardiac Monitoring:  ?The patient was maintained on a cardiac monitor.  My attending physician Dr. Rogene Houston viewed and interpreted the  cardiac monitored which showed an underlying rhythm of: Sinus rhythm.  I agree with this interpretation. ?  ?Disposition: ?After consideration of the diagnostic results and the patients response to treatment, I f

## 2021-06-21 NOTE — Discharge Instructions (Signed)
You were seen emergency department today for chest pain. ? ?As we discussed your lab work and x-ray of your chest all look normal today.  I think that your symptoms are like related to heavy coughing after smoking marijuana.  Cutting down on smoking even in the smallest amount can help improve your symptoms. ? ?Continue to monitor how you're doing and return to the ER for new or worsening symptoms.  ?

## 2021-06-21 NOTE — ED Triage Notes (Signed)
Report CP off/on for the last 2weeks. States it is worse when bending over. Reports no CP at this time. CP was last present 1 hour ago.  ? ?No changes in CP over last 2weeks.  ?

## 2021-06-25 ENCOUNTER — Ambulatory Visit (INDEPENDENT_AMBULATORY_CARE_PROVIDER_SITE_OTHER): Payer: Self-pay | Admitting: Nurse Practitioner

## 2021-06-25 ENCOUNTER — Telehealth: Payer: Self-pay

## 2021-06-25 ENCOUNTER — Encounter: Payer: Self-pay | Admitting: Nurse Practitioner

## 2021-06-25 ENCOUNTER — Other Ambulatory Visit: Payer: Self-pay

## 2021-06-25 VITALS — BP 138/87 | HR 80 | Resp 18

## 2021-06-25 DIAGNOSIS — E119 Type 2 diabetes mellitus without complications: Secondary | ICD-10-CM

## 2021-06-25 DIAGNOSIS — I10 Essential (primary) hypertension: Secondary | ICD-10-CM

## 2021-06-25 DIAGNOSIS — R0789 Other chest pain: Secondary | ICD-10-CM | POA: Insufficient documentation

## 2021-06-25 LAB — POCT URINALYSIS DIP (CLINITEK)
Bilirubin, UA: NEGATIVE
Glucose, UA: NEGATIVE mg/dL
Ketones, POC UA: NEGATIVE mg/dL
Leukocytes, UA: NEGATIVE
Nitrite, UA: NEGATIVE
POC PROTEIN,UA: >=300 — AB
Spec Grav, UA: 1.025 (ref 1.010–1.025)
Urobilinogen, UA: 0.2 E.U./dL
pH, UA: 6 (ref 5.0–8.0)

## 2021-06-25 MED ORDER — LOSARTAN POTASSIUM 50 MG PO TABS
ORAL_TABLET | ORAL | 3 refills | Status: DC
  Filled 2021-06-25: qty 180, fill #0
  Filled 2021-08-05: qty 180, 90d supply, fill #0
  Filled 2021-12-03: qty 180, 90d supply, fill #1

## 2021-06-25 MED ORDER — GLIPIZIDE 10 MG PO TABS
ORAL_TABLET | Freq: Two times a day (BID) | ORAL | 3 refills | Status: DC
  Filled 2021-06-25: qty 180, fill #0
  Filled 2021-08-05: qty 180, 90d supply, fill #0
  Filled 2021-12-03: qty 180, 90d supply, fill #1

## 2021-06-25 MED ORDER — CYCLOBENZAPRINE HCL 10 MG PO TABS
10.0000 mg | ORAL_TABLET | Freq: Three times a day (TID) | ORAL | 0 refills | Status: DC | PRN
  Filled 2021-06-25: qty 30, 10d supply, fill #0

## 2021-06-25 MED ORDER — PREDNISONE 20 MG PO TABS
20.0000 mg | ORAL_TABLET | Freq: Every day | ORAL | 0 refills | Status: AC
  Filled 2021-06-25: qty 5, 5d supply, fill #0

## 2021-06-25 NOTE — Progress Notes (Signed)
$'@Patient'N$  ID: Jon Harper, male    DOB: 1955/11/19, 66 y.o.   MRN: [id number] ? ?Chief Complaint  ?Patient presents with  ? chest wall pain  ? ? ?Referring provider: ?Vevelyn Francois, NP ? ? ?HPI ? ? ?Patient presents today for chest wall pain.  He states that he has been having sore muscles throughout his chest for the past week or so.  He did have full work-up in the ED 3 days ago. This included complete cardiac work-up.  Patient is requesting muscle relaxers today for comfort.  We discussed that we can order this for him.  He also needs refills on some of his maintenance medications.  We discussed that he is due for hemoglobin A1c and we will check blood work. Denies f/c/s, n/v/d, hemoptysis, PND, chest pain or edema. ? ? ? ?No Known Allergies ? ?Immunization History  ?Administered Date(s) Administered  ? Influenza,inj,Quad PF,6+ Mos 05/14/2017, 04/01/2018, 12/27/2020  ? ? ?Past Medical History:  ?Diagnosis Date  ? Chronic low back pain   ? Diabetes mellitus   ? Hypertension   ? Neck pain   ? Neck pain   ? Skin rash 10/2018  ? Tooth abscess 10/2018  ? ? ?Tobacco History: ?Social History  ? ?Tobacco Use  ?Smoking Status Never  ?Smokeless Tobacco Never  ? ?Counseling given: Not Answered ? ? ?Outpatient Encounter Medications as of 06/25/2021  ?Medication Sig  ? cyclobenzaprine (FLEXERIL) 10 MG tablet Take 1 tablet (10 mg total) by mouth 3 (three) times daily as needed for muscle spasms.  ? predniSONE (DELTASONE) 20 MG tablet Take 1 tablet (20 mg total) by mouth daily with breakfast for 5 days.  ? acetaminophen (TYLENOL 8 HOUR) 650 MG CR tablet Take 1 tablet (650 mg total) by mouth every 8 (eight) hours as needed for pain.  ? acetaminophen (TYLENOL) 500 MG tablet Take 1 tablet (500 mg total) by mouth in the morning and at bedtime.  ? atorvastatin (LIPITOR) 40 MG tablet TAKE 1 TABLET (40 MG TOTAL) BY MOUTH DAILY.  ? DULoxetine (CYMBALTA) 30 MG capsule Take 1 capsule (30 mg total) by mouth daily for 7 days, THEN 2  capsules (60 mg total) daily.  ? gabapentin (NEURONTIN) 300 MG capsule TAKE 1 CAPSULE (300 MG TOTAL) BY MOUTH 2 (TWO) TIMES DAILY.  ? glipiZIDE (GLUCOTROL) 10 MG tablet TAKE 1 TABLET (10 MG TOTAL) BY MOUTH 2 (TWO) TIMES DAILY BEFORE A MEAL.  ? hydrocortisone cream 0.5 % Apply 1 application topically 2 (two) times daily.  ? losartan (COZAAR) 50 MG tablet TAKE 2 TABLETS (100 MG TOTAL) BY MOUTH DAILY.  ? metoprolol tartrate (LOPRESSOR) 25 MG tablet Take 1 tablet (25 mg total) by mouth in the morning, at noon, and at bedtime.  ? oxyCODONE (ROXICODONE) 5 MG immediate release tablet Take 0.5-1 tablets (2.5-5 mg total) by mouth every 6 (six) hours as needed for severe pain.  ? tamsulosin (FLOMAX) 0.4 MG CAPS capsule Take 2 capsules (0.8 mg total) by mouth daily.  ? [DISCONTINUED] glipiZIDE (GLUCOTROL) 10 MG tablet TAKE 1 TABLET (10 MG TOTAL) BY MOUTH 2 (TWO) TIMES DAILY BEFORE A MEAL.  ? [DISCONTINUED] losartan (COZAAR) 50 MG tablet TAKE 2 TABLETS (100 MG TOTAL) BY MOUTH DAILY.  ? ?No facility-administered encounter medications on file as of 06/25/2021.  ? ? ? ?Review of Systems ? ?Review of Systems  ?Constitutional: Negative.   ?HENT: Negative.    ?Cardiovascular:  Positive for chest pain.  ?Gastrointestinal: Negative.   ?Allergic/Immunologic:  Negative.   ?Neurological: Negative.   ?Psychiatric/Behavioral: Negative.     ? ? ? ?Physical Exam ? ?BP 138/87   Pulse 80   Resp 18  ? ?Wt Readings from Last 5 Encounters:  ?06/21/21 250 lb (113.4 kg)  ?05/11/21 253 lb 9.6 oz (115 kg)  ?03/29/21 251 lb 3.2 oz (113.9 kg)  ?03/07/21 247 lb 0.2 oz (112 kg)  ?12/27/20 246 lb 9.6 oz (111.9 kg)  ? ? ? ?Physical Exam ?Vitals and nursing note reviewed.  ?Constitutional:   ?   General: He is not in acute distress. ?   Appearance: He is well-developed.  ?Cardiovascular:  ?   Rate and Rhythm: Normal rate and regular rhythm.  ?Pulmonary:  ?   Effort: Pulmonary effort is normal.  ?   Breath sounds: Normal breath sounds.  ?Chest:  ?   Chest  wall: Tenderness present. No swelling or edema.  ?Skin: ?   General: Skin is warm and dry.  ?Neurological:  ?   Mental Status: He is alert and oriented to person, place, and time.  ? ? ? ? ?Assessment & Plan:  ? ?Chest wall pain ?- cyclobenzaprine (FLEXERIL) 10 MG tablet; Take 1 tablet (10 mg total) by mouth 3 (three) times daily as needed for muscle spasms.  Dispense: 30 tablet; Refill: 0 ?- predniSONE (DELTASONE) 20 MG tablet; Take 1 tablet (20 mg total) by mouth daily with breakfast for 5 days.  Dispense: 5 tablet; Refill: 0 ?- CBC ?- Comprehensive metabolic panel ? ?2. Type 2 diabetes mellitus without complication, without long-term current use of insulin ([place]) ? ?- HgB A1c ?- CBC ?- Comprehensive metabolic panel ?- POCT UA - Microscopic Only ? ?3. Hypertension, unspecified type ? ?- HgB A1c ?- CBC ?- Comprehensive metabolic panel ?- POCT UA - Microscopic Only ? ? ? ?Follow up: ? ?Follow up as needed ? ? ? ? ?Fenton Foy, NP ?06/25/2021 ? ?

## 2021-06-25 NOTE — Assessment & Plan Note (Signed)
-   cyclobenzaprine (FLEXERIL) 10 MG tablet; Take 1 tablet (10 mg total) by mouth 3 (three) times daily as needed for muscle spasms.  Dispense: 30 tablet; Refill: 0 ?- predniSONE (DELTASONE) 20 MG tablet; Take 1 tablet (20 mg total) by mouth daily with breakfast for 5 days.  Dispense: 5 tablet; Refill: 0 ?- CBC ?- Comprehensive metabolic panel ? ?2. Type 2 diabetes mellitus without complication, without long-term current use of insulin ([place]) ? ?- HgB A1c ?- CBC ?- Comprehensive metabolic panel ?- POCT UA - Microscopic Only ? ?3. Hypertension, unspecified type ? ?- HgB A1c ?- CBC ?- Comprehensive metabolic panel ?- POCT UA - Microscopic Only ? ? ? ?Follow up: ? ?Follow up as needed ?

## 2021-06-25 NOTE — Patient Instructions (Addendum)
1. Chest wall pain ? ?- cyclobenzaprine (FLEXERIL) 10 MG tablet; Take 1 tablet (10 mg total) by mouth 3 (three) times daily as needed for muscle spasms.  Dispense: 30 tablet; Refill: 0 ?- predniSONE (DELTASONE) 20 MG tablet; Take 1 tablet (20 mg total) by mouth daily with breakfast for 5 days.  Dispense: 5 tablet; Refill: 0 ?- CBC ?- Comprehensive metabolic panel ? ?2. Type 2 diabetes mellitus without complication, without long-term current use of insulin ([place]) ? ?- HgB A1c ?- CBC ?- Comprehensive metabolic panel ?- POCT UA - Microscopic Only ? ?3. Hypertension, unspecified type ? ?- HgB A1c ?- CBC ?- Comprehensive metabolic panel ?- POCT UA - Microscopic Only ? ? ? ?Follow up: ? ?Follow up as needed ? ?

## 2021-06-25 NOTE — Telephone Encounter (Signed)
Muscle Relaxer ?

## 2021-06-26 ENCOUNTER — Other Ambulatory Visit: Payer: Self-pay

## 2021-06-26 LAB — CBC
Hematocrit: 39.5 % (ref 37.5–51.0)
Hemoglobin: 13.6 g/dL (ref 13.0–17.7)
MCH: 27.6 pg (ref 26.6–33.0)
MCHC: 34.4 g/dL (ref 31.5–35.7)
MCV: 80 fL (ref 79–97)
Platelets: 201 10*3/uL (ref 150–450)
RBC: 4.92 x10E6/uL (ref 4.14–5.80)
RDW: 14.4 % (ref 11.6–15.4)
WBC: 7.1 10*3/uL (ref 3.4–10.8)

## 2021-06-26 LAB — COMPREHENSIVE METABOLIC PANEL
ALT: 30 IU/L (ref 0–44)
AST: 18 IU/L (ref 0–40)
Albumin/Globulin Ratio: 1.5 (ref 1.2–2.2)
Albumin: 4.4 g/dL (ref 3.8–4.8)
Alkaline Phosphatase: 75 IU/L (ref 44–121)
BUN/Creatinine Ratio: 17 (ref 10–24)
BUN: 23 mg/dL (ref 8–27)
Bilirubin Total: 0.5 mg/dL (ref 0.0–1.2)
CO2: 22 mmol/L (ref 20–29)
Calcium: 9.8 mg/dL (ref 8.6–10.2)
Chloride: 105 mmol/L (ref 96–106)
Creatinine, Ser: 1.32 mg/dL — ABNORMAL HIGH (ref 0.76–1.27)
Globulin, Total: 3 g/dL (ref 1.5–4.5)
Glucose: 199 mg/dL — ABNORMAL HIGH (ref 70–99)
Potassium: 4.6 mmol/L (ref 3.5–5.2)
Sodium: 141 mmol/L (ref 134–144)
Total Protein: 7.4 g/dL (ref 6.0–8.5)
eGFR: 60 mL/min/{1.73_m2} (ref >59)

## 2021-06-27 ENCOUNTER — Encounter: Payer: Self-pay | Admitting: Nurse Practitioner

## 2021-07-23 ENCOUNTER — Emergency Department (HOSPITAL_BASED_OUTPATIENT_CLINIC_OR_DEPARTMENT_OTHER): Payer: Self-pay

## 2021-07-23 ENCOUNTER — Other Ambulatory Visit: Payer: Self-pay

## 2021-07-23 ENCOUNTER — Encounter (HOSPITAL_BASED_OUTPATIENT_CLINIC_OR_DEPARTMENT_OTHER): Payer: Self-pay | Admitting: Obstetrics and Gynecology

## 2021-07-23 ENCOUNTER — Other Ambulatory Visit (HOSPITAL_BASED_OUTPATIENT_CLINIC_OR_DEPARTMENT_OTHER): Payer: Self-pay

## 2021-07-23 ENCOUNTER — Other Ambulatory Visit (HOSPITAL_COMMUNITY): Payer: Self-pay

## 2021-07-23 ENCOUNTER — Emergency Department (HOSPITAL_BASED_OUTPATIENT_CLINIC_OR_DEPARTMENT_OTHER)
Admission: EM | Admit: 2021-07-23 | Discharge: 2021-07-23 | Disposition: A | Discharge status: Home / Self Care | Payer: Self-pay | Attending: Emergency Medicine | Admitting: Emergency Medicine

## 2021-07-23 DIAGNOSIS — E1165 Type 2 diabetes mellitus with hyperglycemia: Secondary | ICD-10-CM | POA: Insufficient documentation

## 2021-07-23 DIAGNOSIS — Z7984 Long term (current) use of oral hypoglycemic drugs: Secondary | ICD-10-CM | POA: Insufficient documentation

## 2021-07-23 DIAGNOSIS — R0789 Other chest pain: Secondary | ICD-10-CM | POA: Insufficient documentation

## 2021-07-23 DIAGNOSIS — Z79899 Other long term (current) drug therapy: Secondary | ICD-10-CM | POA: Insufficient documentation

## 2021-07-23 DIAGNOSIS — I10 Essential (primary) hypertension: Secondary | ICD-10-CM | POA: Insufficient documentation

## 2021-07-23 LAB — CBC
HCT: 40.2 % (ref 39.0–52.0)
Hemoglobin: 13.6 g/dL (ref 13.0–17.0)
MCH: 27.8 pg (ref 26.0–34.0)
MCHC: 33.8 g/dL (ref 30.0–36.0)
MCV: 82.2 fL (ref 80.0–100.0)
Platelets: 156 10*3/uL (ref 150–400)
RBC: 4.89 MIL/uL (ref 4.22–5.81)
RDW: 13.9 % (ref 11.5–15.5)
WBC: 6.1 10*3/uL (ref 4.0–10.5)
nRBC: 0 % (ref 0.0–0.2)

## 2021-07-23 LAB — BASIC METABOLIC PANEL
Anion gap: 9 (ref 5–15)
BUN: 19 mg/dL (ref 8–23)
CO2: 27 mmol/L (ref 22–32)
Calcium: 9.5 mg/dL (ref 8.9–10.3)
Chloride: 103 mmol/L (ref 98–111)
Creatinine, Ser: 1.29 mg/dL — ABNORMAL HIGH (ref 0.61–1.24)
GFR, Estimated: >60 mL/min (ref >60)
Glucose, Bld: 248 mg/dL — ABNORMAL HIGH (ref 70–99)
Potassium: 4.4 mmol/L (ref 3.5–5.1)
Sodium: 139 mmol/L (ref 135–145)

## 2021-07-23 LAB — TROPONIN I (HIGH SENSITIVITY): Troponin I (High Sensitivity): 11 ng/L (ref <18)

## 2021-07-23 MED ORDER — CYCLOBENZAPRINE HCL 10 MG PO TABS
10.0000 mg | ORAL_TABLET | Freq: Two times a day (BID) | ORAL | 0 refills | Status: DC | PRN
  Filled 2021-07-23 (×2): qty 20, 10d supply, fill #0

## 2021-07-23 NOTE — Discharge Instructions (Signed)
Recommend Tylenol, ibuprofen, ice.  I suspect that you have a muscular process going on. ?

## 2021-07-23 NOTE — ED Triage Notes (Signed)
Patient reports he did some moving yesterday and started having chest pain x3-4 days ago. Patient reports pain in the ribs and chest. Patient reports he hopes it is MSK and not cardiac related but wanted to get checked out.  ?

## 2021-07-23 NOTE — ED Provider Notes (Signed)
?[name] ?Provider Note ? ? ?CSN: [id number] ?Arrival date & time: 07/23/21  1037 ? ?  ? ?History ? ?Chief Complaint  ?Patient presents with  ? Chest Pain  ? ? ?Jon Harper is a 66 y.o. male. ? ?Patient here with chest pain for the last 3 to 4 days.  Pain is worse with movement.  Rest makes it better.  Has a history of hypertension, diabetes.  Pain is dull.  Pain is intermittent.  Denies any abdominal pain, nausea, vomiting, headache, weakness, numbness, chills. ? ?The history is provided by the patient.  ?Chest Pain ? ?  ? ?Home Medications ?Prior to Admission medications   ?Medication Sig Start Date End Date Taking? Authorizing Provider  ?acetaminophen (TYLENOL 8 HOUR) 650 MG CR tablet Take 1 tablet (650 mg total) by mouth every 8 (eight) hours as needed for pain. 09/20/20   Suzy Bouchard, PA-C  ?acetaminophen (TYLENOL) 500 MG tablet Take 1 tablet (500 mg total) by mouth in the morning and at bedtime. 01/25/20   Azzie Glatter, FNP  ?atorvastatin (LIPITOR) 40 MG tablet TAKE 1 TABLET (40 MG TOTAL) BY MOUTH DAILY. 07/26/20 08/30/21  Vevelyn Francois, NP  ?cyclobenzaprine (FLEXERIL) 10 MG tablet Take 1 tablet (10 mg total) by mouth 3 (three) times daily as needed for muscle spasms. 06/25/21   Fenton Foy, NP  ?DULoxetine (CYMBALTA) 30 MG capsule Take 1 capsule (30 mg total) by mouth daily for 7 days, THEN 2 capsules (60 mg total) daily. 12/27/20 01/03/22  Vevelyn Francois, NP  ?gabapentin (NEURONTIN) 300 MG capsule TAKE 1 CAPSULE (300 MG TOTAL) BY MOUTH 2 (TWO) TIMES DAILY. 07/26/20 08/23/21  Vevelyn Francois, NP  ?glipiZIDE (GLUCOTROL) 10 MG tablet TAKE 1 TABLET (10 MG TOTAL) BY MOUTH 2 (TWO) TIMES DAILY BEFORE A MEAL. 06/25/21 06/25/22  Fenton Foy, NP  ?hydrocortisone cream 0.5 % Apply 1 application topically 2 (two) times daily. 10/26/18   Azzie Glatter, FNP  ?losartan (COZAAR) 50 MG tablet TAKE 2 TABLETS (100 MG TOTAL) BY MOUTH DAILY. 06/25/21 06/25/22  Fenton Foy, NP  ?metoprolol tartrate (LOPRESSOR) 25 MG tablet Take 1 tablet (25 mg total) by mouth in the morning, at noon, and at bedtime. 03/29/21 07/27/21  Vevelyn Francois, NP  ?oxyCODONE (ROXICODONE) 5 MG immediate release tablet Take 0.5-1 tablets (2.5-5 mg total) by mouth every 6 (six) hours as needed for severe pain. 10/15/20   Margarita Mail, PA-C  ?tamsulosin (FLOMAX) 0.4 MG CAPS capsule Take 2 capsules (0.8 mg total) by mouth daily. 07/26/20 07/26/21  Vevelyn Francois, NP  ?   ? ?Allergies    ?Patient has no known allergies.   ? ?Review of Systems   ?Review of Systems  ?Cardiovascular:  Positive for chest pain.  ? ?Physical Exam ?Updated Vital Signs ?BP (!) 154/93   Pulse 74   Temp 97.8 ?F (36.6 ?C)   Resp 16   Ht '5\' 11"'$  (1.803 m)   Wt 113 kg   SpO2 97%   BMI 34.75 kg/m?  ?Physical Exam ?Vitals and nursing note reviewed.  ?Constitutional:   ?   General: He is not in acute distress. ?   Appearance: He is well-developed. He is not ill-appearing.  ?HENT:  ?   Head: Normocephalic and atraumatic.  ?Eyes:  ?   Extraocular Movements: Extraocular movements intact.  ?   Conjunctiva/sclera: Conjunctivae normal.  ?   Pupils: Pupils are equal, round, and reactive to  light.  ?Cardiovascular:  ?   Rate and Rhythm: Normal rate and regular rhythm.  ?   Pulses:     ?     Radial pulses are 2+ on the right side and 2+ on the left side.  ?   Heart sounds: Normal heart sounds. No murmur heard. ?Pulmonary:  ?   Effort: Pulmonary effort is normal. No respiratory distress.  ?   Breath sounds: Normal breath sounds.  ?Chest:  ?   Chest wall: Tenderness present.  ?Abdominal:  ?   Palpations: Abdomen is soft.  ?   Tenderness: There is no abdominal tenderness.  ?Musculoskeletal:     ?   General: No swelling. Normal range of motion.  ?   Cervical back: Normal range of motion and neck supple.  ?   Right lower leg: No edema.  ?   Left lower leg: No edema.  ?Skin: ?   General: Skin is warm and dry.  ?   Capillary Refill: Capillary refill takes  less than 2 seconds.  ?Neurological:  ?   Mental Status: He is alert.  ?Psychiatric:     ?   Mood and Affect: Mood normal.  ? ? ?ED Results / Procedures / Treatments   ?Labs ?(all labs ordered are listed, but only abnormal results are displayed) ?Labs Reviewed  ?BASIC METABOLIC PANEL - Abnormal; Notable for the following components:  ?    Result Value  ? Glucose, Bld 248 (*)   ? Creatinine, Ser 1.29 (*)   ? All other components within normal limits  ?CBC  ?TROPONIN I (HIGH SENSITIVITY)  ? ? ?EKG ?EKG Interpretation ? ?Date/Time:  Monday Jul 23 2021 10:46:06 EDT ?Ventricular Rate:  83 ?PR Interval:  190 ?QRS Duration: 74 ?QT Interval:  346 ?QTC Calculation: 406 ?R Axis:   20 ?Text Interpretation: Normal sinus rhythm Normal ECG When compared with ECG of 21-Jun-2021 13:37, Premature ventricular complexes are no longer Present Confirmed by Lennice Sites ([id number]) on 07/23/2021 10:48:20 AM ? ?Radiology ?DG Chest Portable 1 View ? ?Result Date: 07/23/2021 ?CLINICAL DATA:  Chest pain EXAM: PORTABLE CHEST 1 VIEW COMPARISON:  June 21, 2021 FINDINGS: The heart size and mediastinal contours are within normal limits. Atheromatous calcifications at the arch of the aorta. Thoracic aorta is ectatic. Both lungs are clear. Moderate thoracic spondylosis. IMPRESSION: No active disease. Electronically Signed   By: Frazier [name] M.D.   On: 07/23/2021 11:34   ? ?Procedures ?Procedures  ? ? ?Medications Ordered in ED ?Medications - No data to display ? ?ED Course/ Medical Decision Making/ A&P ?  ?                        ?Medical Decision Making ?Amount and/or Complexity of Data Reviewed ?Labs: ordered. ?Radiology: ordered. ? ? ?Jon Harper is here with chest wall pain.  Patient has reproducible chest pain over the right side of anterior ribs.  Pain worse after he started to move some objects recently.  Pain for the last several days on and off.  Pain is worse with movement and with palpation.  Does have multiple cardiac risk factors with  high cholesterol, diabetes, hypertension.  EKG per my review and interpretation shows sinus rhythm.  Given his cardiac risk factors we did proceed troponin, basic labs.  I have a low suspicion for ACS and suspect that this is musculoskeletal.  Have no concern for pancreatitis or cholecystitis or PE given history and physical. ? ?  Per my review and interpretation of labs there is no significant anemia, electrolyte abnormality, kidney injury.  Blood sugar is mildly elevated but not in DKA.  Chest x-ray per my review and interpretation shows no pneumonia or pneumothorax.  Overall recommend Tylenol, ibuprofen, ice, rest.  Discharged in good condition.  Recommend follow-up with primary care doctor. ? ?This chart was dictated using voice recognition software.  Despite best efforts to proofread,  errors can occur which can change the documentation meaning.  ? ? ? ? ? ? ? ?Final Clinical Impression(s) / ED Diagnoses ?Final diagnoses:  ?Chest wall pain  ? ? ?Rx / DC Orders ?ED Discharge Orders   ? ? None  ? ?  ? ? ?  ?Lennice Sites, DO ?07/23/21 1154 ? ?

## 2021-08-05 ENCOUNTER — Other Ambulatory Visit: Payer: Self-pay | Admitting: Nurse Practitioner

## 2021-08-05 ENCOUNTER — Other Ambulatory Visit: Payer: Self-pay | Admitting: Emergency Medicine

## 2021-08-05 DIAGNOSIS — G629 Polyneuropathy, unspecified: Secondary | ICD-10-CM

## 2021-08-05 DIAGNOSIS — E119 Type 2 diabetes mellitus without complications: Secondary | ICD-10-CM

## 2021-08-05 DIAGNOSIS — E785 Hyperlipidemia, unspecified: Secondary | ICD-10-CM

## 2021-08-07 ENCOUNTER — Other Ambulatory Visit (HOSPITAL_COMMUNITY): Payer: Self-pay

## 2021-08-07 ENCOUNTER — Other Ambulatory Visit: Payer: Self-pay

## 2021-08-07 MED ORDER — ATORVASTATIN CALCIUM 40 MG PO TABS
ORAL_TABLET | Freq: Every day | ORAL | 0 refills | Status: DC
  Filled 2021-08-07: qty 90, 90d supply, fill #0

## 2021-08-07 MED ORDER — GABAPENTIN 300 MG PO CAPS
ORAL_CAPSULE | Freq: Two times a day (BID) | ORAL | 0 refills | Status: DC
  Filled 2021-08-07: qty 60, 30d supply, fill #0
  Filled 2021-10-01: qty 60, 30d supply, fill #1
  Filled 2021-11-02: qty 60, 30d supply, fill #2

## 2021-08-08 ENCOUNTER — Other Ambulatory Visit: Payer: Self-pay

## 2021-09-06 ENCOUNTER — Other Ambulatory Visit: Payer: Self-pay

## 2021-09-06 ENCOUNTER — Ambulatory Visit (INDEPENDENT_AMBULATORY_CARE_PROVIDER_SITE_OTHER): Payer: Self-pay | Admitting: Nurse Practitioner

## 2021-09-06 ENCOUNTER — Encounter: Payer: Self-pay | Admitting: Nurse Practitioner

## 2021-09-06 VITALS — BP 154/95 | HR 76 | Temp 98.4°F | Ht 71.0 in | Wt 249.2 lb

## 2021-09-06 DIAGNOSIS — I10 Essential (primary) hypertension: Secondary | ICD-10-CM

## 2021-09-06 DIAGNOSIS — R42 Dizziness and giddiness: Secondary | ICD-10-CM

## 2021-09-06 DIAGNOSIS — E119 Type 2 diabetes mellitus without complications: Secondary | ICD-10-CM

## 2021-09-06 LAB — POCT GLYCOSYLATED HEMOGLOBIN (HGB A1C)
HbA1c POC (<> result, manual entry): 9.2 % (ref 4.0–5.6)
HbA1c, POC (controlled diabetic range): 9.2 % — AB (ref 0.0–7.0)
HbA1c, POC (prediabetic range): 9.2 % — AB (ref 5.7–6.4)
Hemoglobin A1C: 9.2 % — AB (ref 4.0–5.6)

## 2021-09-06 MED ORDER — METOPROLOL TARTRATE 25 MG PO TABS
25.0000 mg | ORAL_TABLET | Freq: Three times a day (TID) | ORAL | 1 refills | Status: DC
  Filled 2021-09-06: qty 90, 30d supply, fill #0
  Filled 2021-11-02: qty 90, 30d supply, fill #1

## 2021-09-06 MED ORDER — MECLIZINE HCL 12.5 MG PO TABS
12.5000 mg | ORAL_TABLET | Freq: Three times a day (TID) | ORAL | 0 refills | Status: DC | PRN
  Filled 2021-09-06: qty 30, 10d supply, fill #0

## 2021-09-06 MED ORDER — MECLIZINE HCL 25 MG PO TABS
25.0000 mg | ORAL_TABLET | Freq: Three times a day (TID) | ORAL | 0 refills | Status: DC | PRN
  Filled 2021-09-06: qty 30, 10d supply, fill #0

## 2021-09-06 NOTE — Progress Notes (Signed)
$'@Patient'n$  ID: Jon Harper, male    DOB: [birth year], 66 y.o.   MRN: [id number]  Chief Complaint  Patient presents with   Dizziness    Dizziness; Patient states for the past two weeks he has been having some dizziness when moving around and driving but not when he is sitting still. Patient denies having headaches.    Referring provider: Fenton Foy, NP   HPI  Patient presents today with dizziness.  He states that for the past couple weeks he is experiencing some dizziness when moving around.  He has also noticed that it when driving.  He states that he is not dizzy when he is sitting still.  Patient has had decreased appetite as well. Denies f/c/s, n/v/d, hemoptysis, PND, leg swelling Denies chest pain or edema       No Known Allergies  Immunization History  Administered Date(s) Administered   Influenza,inj,Quad PF,6+ Mos 05/14/2017, 04/01/2018, 12/27/2020    Past Medical History:  Diagnosis Date   Chronic low back pain    Diabetes mellitus    Hypertension    Neck pain    Neck pain    Skin rash 10/2018   Tooth abscess 10/2018    Tobacco History: Social History   Tobacco Use  Smoking Status Never  Smokeless Tobacco Never   Counseling given: Not Answered   Outpatient Encounter Medications as of 09/06/2021  Medication Sig   atorvastatin (LIPITOR) 40 MG tablet TAKE 1 TABLET (40 MG TOTAL) BY MOUTH DAILY.   cyclobenzaprine (FLEXERIL) 10 MG tablet Take 1 tablet (10 mg total) by mouth 2 (two) times daily as needed for muscle spasms.   gabapentin (NEURONTIN) 300 MG capsule TAKE 1 CAPSULE (300 MG TOTAL) BY MOUTH 2 (TWO) TIMES DAILY.   glipiZIDE (GLUCOTROL) 10 MG tablet TAKE 1 TABLET (10 MG TOTAL) BY MOUTH 2 (TWO) TIMES DAILY BEFORE A MEAL.   losartan (COZAAR) 50 MG tablet TAKE 2 TABLETS (100 MG TOTAL) BY MOUTH DAILY.   meclizine (ANTIVERT) 25 MG tablet Take 1 tablet (25 mg total) by mouth 3 (three) times daily as needed for dizziness.   oxyCODONE (ROXICODONE) 5 MG  immediate release tablet Take 0.5-1 tablets (2.5-5 mg total) by mouth every 6 (six) hours as needed for severe pain.   [DISCONTINUED] meclizine (ANTIVERT) 12.5 MG tablet Take 1 tablet (12.5 mg total) by mouth 3 (three) times daily as needed for dizziness.   [DISCONTINUED] metoprolol tartrate (LOPRESSOR) 25 MG tablet Take 1 tablet (25 mg total) by mouth in the morning, at noon, and at bedtime.   acetaminophen (TYLENOL 8 HOUR) 650 MG CR tablet Take 1 tablet (650 mg total) by mouth every 8 (eight) hours as needed for pain. (Patient not taking: Reported on 09/06/2021)   acetaminophen (TYLENOL) 500 MG tablet Take 1 tablet (500 mg total) by mouth in the morning and at bedtime. (Patient not taking: Reported on 09/06/2021)   DULoxetine (CYMBALTA) 30 MG capsule Take 1 capsule (30 mg total) by mouth daily for 7 days, THEN 2 capsules (60 mg total) daily. (Patient not taking: Reported on 09/06/2021)   hydrocortisone cream 0.5 % Apply 1 application topically 2 (two) times daily. (Patient not taking: Reported on 09/06/2021)   metoprolol tartrate (LOPRESSOR) 25 MG tablet Take 1 tablet (25 mg total) by mouth in the morning, at noon, and at bedtime.   No facility-administered encounter medications on file as of 09/06/2021.     Review of Systems  Review of Systems  Constitutional: Negative.  HENT: Negative.    Cardiovascular: Negative.   Gastrointestinal: Negative.   Allergic/Immunologic: Negative.   Neurological:  Positive for dizziness.  Psychiatric/Behavioral: Negative.         Physical Exam  BP (!) 154/95   Pulse 76   Temp 98.4 F (36.9 C)   Ht '5\' 11"'$  (1.803 m)   Wt 249 lb 3.2 oz (113 kg)   SpO2 99%   BMI 34.76 kg/m   Wt Readings from Last 5 Encounters:  09/06/21 249 lb 3.2 oz (113 kg)  07/23/21 249 lb 1.9 oz (113 kg)  06/21/21 250 lb (113.4 kg)  05/11/21 253 lb 9.6 oz (115 kg)  03/29/21 251 lb 3.2 oz (113.9 kg)     Physical Exam Vitals and nursing note reviewed.  Constitutional:       General: He is not in acute distress.    Appearance: He is well-developed.  Cardiovascular:     Rate and Rhythm: Normal rate and regular rhythm.  Pulmonary:     Effort: Pulmonary effort is normal.     Breath sounds: Normal breath sounds.  Skin:    General: Skin is warm and dry.  Neurological:     Mental Status: He is alert and oriented to person, place, and time.      Lab Results:  CBC    Component Value Date/Time   WBC 7.0 09/06/2021 1608   WBC 6.1 07/23/2021 1053   RBC 5.00 09/06/2021 1608   RBC 4.89 07/23/2021 1053   HGB 13.8 09/06/2021 1608   HCT 41.0 09/06/2021 1608   PLT 179 09/06/2021 1608   MCV 82 09/06/2021 1608   MCH 27.6 09/06/2021 1608   MCH 27.8 07/23/2021 1053   MCHC 33.7 09/06/2021 1608   MCHC 33.8 07/23/2021 1053   RDW 14.2 09/06/2021 1608   LYMPHSABS 1.8 10/15/2020 1444   MONOABS 0.8 10/15/2020 1444   EOSABS 0.2 10/15/2020 1444   BASOSABS 0.0 10/15/2020 1444    BMET    Component Value Date/Time   NA 140 09/06/2021 1608   K 5.2 09/06/2021 1608   CL 102 09/06/2021 1608   CO2 22 09/06/2021 1608   GLUCOSE 156 (H) 09/06/2021 1608   GLUCOSE 248 (H) 07/23/2021 1053   BUN 16 09/06/2021 1608   CREATININE 1.19 09/06/2021 1608   CALCIUM 9.7 09/06/2021 1608   GFRNONAA >60 07/23/2021 1053   GFRAA >60 01/30/2018 0339    BNP No results found for: "BNP"  ProBNP No results found for: "PROBNP"  Imaging: No results found.   Assessment & Plan:   Hypertension - metoprolol tartrate (LOPRESSOR) 25 MG tablet; Take 1 tablet (25 mg total) by mouth in the morning, at noon, and at bedtime.  Dispense: 90 tablet; Refill: 1 - CBC - Comprehensive metabolic panel  2. Type 2 diabetes mellitus without complication, without long-term current use of insulin ([facility])  - HgB A1c - CBC - Comprehensive metabolic panel  3. Dizziness  - CBC - Comprehensive metabolic panel - meclizine (ANTIVERT) 12.5 MG tablet; Take 1 tablet (12.5 mg total) by mouth 3 (three)  times daily as needed for dizziness.  Dispense: 30 tablet; Refill: 0 - Ambulatory referral to Physical Therapy    Follow up:  Follow up if needed     Fenton Foy, NP 10/12/2021

## 2021-09-06 NOTE — Patient Instructions (Signed)
1. Hypertension, unspecified type  - metoprolol tartrate (LOPRESSOR) 25 MG tablet; Take 1 tablet (25 mg total) by mouth in the morning, at noon, and at bedtime.  Dispense: 90 tablet; Refill: 1 - CBC - Comprehensive metabolic panel  2. Type 2 diabetes mellitus without complication, without long-term current use of insulin ([facility])  - HgB A1c - CBC - Comprehensive metabolic panel  3. Dizziness  - CBC - Comprehensive metabolic panel - meclizine (ANTIVERT) 12.5 MG tablet; Take 1 tablet (12.5 mg total) by mouth 3 (three) times daily as needed for dizziness.  Dispense: 30 tablet; Refill: 0 - Ambulatory referral to Physical Therapy    Follow up:  Follow up if needed

## 2021-09-07 ENCOUNTER — Other Ambulatory Visit: Payer: Self-pay | Admitting: Nurse Practitioner

## 2021-09-07 ENCOUNTER — Other Ambulatory Visit: Payer: Self-pay

## 2021-09-07 DIAGNOSIS — E119 Type 2 diabetes mellitus without complications: Secondary | ICD-10-CM

## 2021-09-07 LAB — COMPREHENSIVE METABOLIC PANEL
ALT: 48 IU/L — ABNORMAL HIGH (ref 0–44)
AST: 21 IU/L (ref 0–40)
Albumin/Globulin Ratio: 1.4 (ref 1.2–2.2)
Albumin: 4.4 g/dL (ref 3.8–4.8)
Alkaline Phosphatase: 74 IU/L (ref 44–121)
BUN/Creatinine Ratio: 13 (ref 10–24)
BUN: 16 mg/dL (ref 8–27)
Bilirubin Total: 0.4 mg/dL (ref 0.0–1.2)
CO2: 22 mmol/L (ref 20–29)
Calcium: 9.7 mg/dL (ref 8.6–10.2)
Chloride: 102 mmol/L (ref 96–106)
Creatinine, Ser: 1.19 mg/dL (ref 0.76–1.27)
Globulin, Total: 3.2 g/dL (ref 1.5–4.5)
Glucose: 156 mg/dL — ABNORMAL HIGH (ref 70–99)
Potassium: 5.2 mmol/L (ref 3.5–5.2)
Sodium: 140 mmol/L (ref 134–144)
Total Protein: 7.6 g/dL (ref 6.0–8.5)
eGFR: 68 mL/min/{1.73_m2} (ref >59)

## 2021-09-07 LAB — CBC
Hematocrit: 41 % (ref 37.5–51.0)
Hemoglobin: 13.8 g/dL (ref 13.0–17.7)
MCH: 27.6 pg (ref 26.6–33.0)
MCHC: 33.7 g/dL (ref 31.5–35.7)
MCV: 82 fL (ref 79–97)
Platelets: 179 10*3/uL (ref 150–450)
RBC: 5 x10E6/uL (ref 4.14–5.80)
RDW: 14.2 % (ref 11.6–15.4)
WBC: 7 10*3/uL (ref 3.4–10.8)

## 2021-09-07 MED ORDER — METFORMIN HCL 500 MG PO TABS
500.0000 mg | ORAL_TABLET | Freq: Two times a day (BID) | ORAL | 11 refills | Status: DC
  Filled 2021-09-07: qty 60, 30d supply, fill #0

## 2021-09-13 ENCOUNTER — Other Ambulatory Visit: Payer: Self-pay

## 2021-09-17 ENCOUNTER — Ambulatory Visit: Payer: Self-pay | Admitting: Nurse Practitioner

## 2021-09-25 ENCOUNTER — Encounter: Payer: Self-pay | Admitting: Physical Therapy

## 2021-09-27 ENCOUNTER — Ambulatory Visit: Payer: Self-pay | Admitting: Nurse Practitioner

## 2021-10-01 ENCOUNTER — Other Ambulatory Visit: Payer: Self-pay

## 2021-10-12 ENCOUNTER — Encounter: Payer: Self-pay | Admitting: Nurse Practitioner

## 2021-10-12 DIAGNOSIS — I10 Essential (primary) hypertension: Secondary | ICD-10-CM | POA: Insufficient documentation

## 2021-10-12 NOTE — Assessment & Plan Note (Signed)
-   metoprolol tartrate (LOPRESSOR) 25 MG tablet; Take 1 tablet (25 mg total) by mouth in the morning, at noon, and at bedtime.  Dispense: 90 tablet; Refill: 1 - CBC - Comprehensive metabolic panel  2. Type 2 diabetes mellitus without complication, without long-term current use of insulin ([facility])  - HgB A1c - CBC - Comprehensive metabolic panel  3. Dizziness  - CBC - Comprehensive metabolic panel - meclizine (ANTIVERT) 12.5 MG tablet; Take 1 tablet (12.5 mg total) by mouth 3 (three) times daily as needed for dizziness.  Dispense: 30 tablet; Refill: 0 - Ambulatory referral to Physical Therapy    Follow up:  Follow up if needed

## 2021-10-31 ENCOUNTER — Telehealth: Payer: Self-pay | Admitting: Nurse Practitioner

## 2021-10-31 NOTE — Telephone Encounter (Signed)
Patient LVM on nurse line. He returned call regarding his test results and requests a call back.

## 2021-11-02 ENCOUNTER — Other Ambulatory Visit: Payer: Self-pay

## 2021-12-03 ENCOUNTER — Other Ambulatory Visit: Payer: Self-pay | Admitting: Nurse Practitioner

## 2021-12-03 ENCOUNTER — Other Ambulatory Visit: Payer: Self-pay

## 2021-12-03 DIAGNOSIS — E785 Hyperlipidemia, unspecified: Secondary | ICD-10-CM

## 2021-12-03 DIAGNOSIS — E119 Type 2 diabetes mellitus without complications: Secondary | ICD-10-CM

## 2021-12-03 DIAGNOSIS — G629 Polyneuropathy, unspecified: Secondary | ICD-10-CM

## 2021-12-03 DIAGNOSIS — I10 Essential (primary) hypertension: Secondary | ICD-10-CM

## 2021-12-04 ENCOUNTER — Other Ambulatory Visit: Payer: Self-pay

## 2021-12-05 ENCOUNTER — Ambulatory Visit (INDEPENDENT_AMBULATORY_CARE_PROVIDER_SITE_OTHER): Payer: Self-pay | Admitting: Nurse Practitioner

## 2021-12-05 ENCOUNTER — Encounter: Payer: Self-pay | Admitting: Nurse Practitioner

## 2021-12-05 ENCOUNTER — Other Ambulatory Visit: Payer: Self-pay

## 2021-12-05 VITALS — BP 152/94 | HR 68 | Temp 97.2°F | Wt 246.2 lb

## 2021-12-05 DIAGNOSIS — G629 Polyneuropathy, unspecified: Secondary | ICD-10-CM

## 2021-12-05 DIAGNOSIS — F32A Depression, unspecified: Secondary | ICD-10-CM

## 2021-12-05 DIAGNOSIS — E785 Hyperlipidemia, unspecified: Secondary | ICD-10-CM

## 2021-12-05 DIAGNOSIS — E119 Type 2 diabetes mellitus without complications: Secondary | ICD-10-CM

## 2021-12-05 DIAGNOSIS — I10 Essential (primary) hypertension: Secondary | ICD-10-CM

## 2021-12-05 DIAGNOSIS — R5383 Other fatigue: Secondary | ICD-10-CM

## 2021-12-05 DIAGNOSIS — E1169 Type 2 diabetes mellitus with other specified complication: Secondary | ICD-10-CM

## 2021-12-05 LAB — POCT GLYCOSYLATED HEMOGLOBIN (HGB A1C)
HbA1c POC (<> result, manual entry): 8.6 % (ref 4.0–5.6)
HbA1c, POC (controlled diabetic range): 8.6 % — AB (ref 0.0–7.0)
HbA1c, POC (prediabetic range): 8.6 % — AB (ref 5.7–6.4)
Hemoglobin A1C: 8.6 % — AB (ref 4.0–5.6)

## 2021-12-05 MED ORDER — METOPROLOL TARTRATE 25 MG PO TABS
25.0000 mg | ORAL_TABLET | Freq: Three times a day (TID) | ORAL | 1 refills | Status: DC
  Filled 2021-12-05: qty 90, 30d supply, fill #0
  Filled 2022-02-11: qty 90, 30d supply, fill #1

## 2021-12-05 MED ORDER — ATORVASTATIN CALCIUM 40 MG PO TABS
40.0000 mg | ORAL_TABLET | Freq: Every day | ORAL | 0 refills | Status: DC
  Filled 2021-12-05: qty 90, 90d supply, fill #0

## 2021-12-05 MED ORDER — ACETAMINOPHEN ER 650 MG PO TBCR
650.0000 mg | EXTENDED_RELEASE_TABLET | Freq: Three times a day (TID) | ORAL | 0 refills | Status: DC | PRN
  Filled 2021-12-05: qty 20, 7d supply, fill #0

## 2021-12-05 MED ORDER — LOSARTAN POTASSIUM 50 MG PO TABS
100.0000 mg | ORAL_TABLET | Freq: Every day | ORAL | 3 refills | Status: DC
  Filled 2021-12-05: qty 180, fill #0
  Filled 2022-02-11: qty 180, 90d supply, fill #0

## 2021-12-05 MED ORDER — CYCLOBENZAPRINE HCL 10 MG PO TABS
10.0000 mg | ORAL_TABLET | Freq: Two times a day (BID) | ORAL | 0 refills | Status: DC | PRN
  Filled 2021-12-05: qty 20, 10d supply, fill #0

## 2021-12-05 MED ORDER — METFORMIN HCL 500 MG PO TABS
500.0000 mg | ORAL_TABLET | Freq: Two times a day (BID) | ORAL | 11 refills | Status: DC
  Filled 2021-12-05: qty 60, 30d supply, fill #0

## 2021-12-05 MED ORDER — GABAPENTIN 300 MG PO CAPS
300.0000 mg | ORAL_CAPSULE | Freq: Two times a day (BID) | ORAL | 0 refills | Status: DC
  Filled 2021-12-05: qty 60, 30d supply, fill #0
  Filled 2022-01-07: qty 60, 30d supply, fill #1
  Filled 2022-02-11: qty 60, 30d supply, fill #2

## 2021-12-05 MED ORDER — GLIPIZIDE 10 MG PO TABS
10.0000 mg | ORAL_TABLET | Freq: Two times a day (BID) | ORAL | 3 refills | Status: DC
  Filled 2021-12-05: qty 180, fill #0
  Filled 2022-02-11: qty 129, 65d supply, fill #0

## 2021-12-05 MED ORDER — DULOXETINE HCL 30 MG PO CPEP
ORAL_CAPSULE | ORAL | 11 refills | Status: DC
  Filled 2021-12-05: qty 60, 33d supply, fill #0

## 2021-12-05 NOTE — Progress Notes (Signed)
$'@Patient'W$  ID: Jon Harper, male    DOB: 08/03/1955, 66 y.o.   MRN: [id number]  Chief Complaint  Patient presents with   Diabetes    Pt is here for 3 months DM follow up visit. Pt is is requesting refill on all medications     Referring provider: Vevelyn Francois, NP   HPI  Jon Harper presents for follow up. He  has a past medical history of Chronic low back pain, Diabetes mellitus, Hypertension, Neck pain, Neck pain, Skin rash (10/2018), and Tooth abscess (10/2018).    Jon Harper is in today for follow up for diabetes and Hypertension. The current prescribed treatment is HTN losartan 50 mg BID and metoprolol 25 mg BID. Compliance is reported. The  DASH diet is being followed. An exercise regimen is not  ongoing. There is a goal to is to maintain. Denies headache, dizziness, visual changes, shortness of breath, dyspnea on exertion, chest pain, nausea, vomiting.  Patient is currently taking glipizide, but has not been taking metformin. Patient has not been following diabetic diet. A1C in office today is 8.6. we discussed that he to continue metformin.     Needs refill for tylenol and flexeril - will also refill Cymbalta for appetite, pain and depression - he has not been taking this.    No Known Allergies  Immunization History  Administered Date(s) Administered   Influenza,inj,Quad PF,6+ Mos 05/14/2017, 04/01/2018, 12/27/2020    Past Medical History:  Diagnosis Date   Chronic low back pain    Diabetes mellitus    Hypertension    Neck pain    Neck pain    Skin rash 10/2018   Tooth abscess 10/2018    Tobacco History: Social History   Tobacco Use  Smoking Status Never  Smokeless Tobacco Never   Counseling given: Not Answered   Outpatient Encounter Medications as of 12/05/2021  Medication Sig   meclizine (ANTIVERT) 25 MG tablet Take 1 tablet (25 mg total) by mouth 3 (three) times daily as needed for dizziness.   [DISCONTINUED] atorvastatin (LIPITOR) 40 MG tablet TAKE  1 TABLET (40 MG TOTAL) BY MOUTH DAILY.   [DISCONTINUED] gabapentin (NEURONTIN) 300 MG capsule TAKE 1 CAPSULE (300 MG TOTAL) BY MOUTH 2 (TWO) TIMES DAILY.   [DISCONTINUED] glipiZIDE (GLUCOTROL) 10 MG tablet TAKE 1 TABLET (10 MG TOTAL) BY MOUTH 2 (TWO) TIMES DAILY BEFORE A MEAL.   [DISCONTINUED] losartan (COZAAR) 50 MG tablet TAKE 2 TABLETS (100 MG TOTAL) BY MOUTH DAILY.   [DISCONTINUED] metoprolol tartrate (LOPRESSOR) 25 MG tablet Take 1 tablet (25 mg total) by mouth in the morning, at noon, and at bedtime.   acetaminophen (TYLENOL 8 HOUR) 650 MG CR tablet Take 1 tablet (650 mg total) by mouth every 8 (eight) hours as needed for pain.   atorvastatin (LIPITOR) 40 MG tablet TAKE 1 TABLET (40 MG TOTAL) BY MOUTH DAILY.   cyclobenzaprine (FLEXERIL) 10 MG tablet Take 1 tablet (10 mg total) by mouth 2 (two) times daily as needed for muscle spasms.   DULoxetine (CYMBALTA) 30 MG capsule Take 1 capsule (30 mg total) by mouth daily for 7 days, THEN 2 capsules (60 mg total) daily.   gabapentin (NEURONTIN) 300 MG capsule TAKE 1 CAPSULE (300 MG TOTAL) BY MOUTH 2 (TWO) TIMES DAILY.   glipiZIDE (GLUCOTROL) 10 MG tablet TAKE 1 TABLET (10 MG TOTAL) BY MOUTH 2 (TWO) TIMES DAILY BEFORE A MEAL.   hydrocortisone cream 0.5 % Apply 1 application topically 2 (two) times daily. (Patient  not taking: Reported on 09/06/2021)   losartan (COZAAR) 50 MG tablet TAKE 2 TABLETS (100 MG TOTAL) BY MOUTH DAILY.   metFORMIN (GLUCOPHAGE) 500 MG tablet Take 1 tablet (500 mg total) by mouth 2 (two) times daily with a meal.   metoprolol tartrate (LOPRESSOR) 25 MG tablet Take 1 tablet (25 mg total) by mouth in the morning, at noon, and at bedtime.   oxyCODONE (ROXICODONE) 5 MG immediate release tablet Take 0.5-1 tablets (2.5-5 mg total) by mouth every 6 (six) hours as needed for severe pain. (Patient not taking: Reported on 12/05/2021)   [DISCONTINUED] acetaminophen (TYLENOL 8 HOUR) 650 MG CR tablet Take 1 tablet (650 mg total) by mouth every 8  (eight) hours as needed for pain. (Patient not taking: Reported on 09/06/2021)   [DISCONTINUED] acetaminophen (TYLENOL) 500 MG tablet Take 1 tablet (500 mg total) by mouth in the morning and at bedtime. (Patient not taking: Reported on 09/06/2021)   [DISCONTINUED] cyclobenzaprine (FLEXERIL) 10 MG tablet Take 1 tablet (10 mg total) by mouth 2 (two) times daily as needed for muscle spasms. (Patient not taking: Reported on 12/05/2021)   [DISCONTINUED] DULoxetine (CYMBALTA) 30 MG capsule Take 1 capsule (30 mg total) by mouth daily for 7 days, THEN 2 capsules (60 mg total) daily. (Patient not taking: Reported on 09/06/2021)   [DISCONTINUED] metFORMIN (GLUCOPHAGE) 500 MG tablet Take 1 tablet (500 mg total) by mouth 2 (two) times daily with a meal. (Patient not taking: Reported on 12/05/2021)   No facility-administered encounter medications on file as of 12/05/2021.     Review of Systems  Review of Systems  Constitutional: Negative.   HENT: Negative.    Cardiovascular: Negative.   Gastrointestinal: Negative.   Allergic/Immunologic: Negative.   Neurological: Negative.   Psychiatric/Behavioral: Negative.         Physical Exam  BP (!) 152/94   Pulse 68   Temp (!) 97.2 F (36.2 C)   Wt 246 lb 4 oz (111.7 kg)   SpO2 100%   BMI 34.34 kg/m   Wt Readings from Last 5 Encounters:  12/05/21 246 lb 4 oz (111.7 kg)  09/06/21 249 lb 3.2 oz (113 kg)  07/23/21 249 lb 1.9 oz (113 kg)  06/21/21 250 lb (113.4 kg)  05/11/21 253 lb 9.6 oz (115 kg)     Physical Exam Vitals and nursing note reviewed.  Constitutional:      General: He is not in acute distress.    Appearance: He is well-developed.  Cardiovascular:     Rate and Rhythm: Normal rate and regular rhythm.  Pulmonary:     Effort: Pulmonary effort is normal.     Breath sounds: Normal breath sounds.  Skin:    General: Skin is warm and dry.  Neurological:     Mental Status: He is alert and oriented to person, place, and time.      Assessment & Plan:   Type 2 diabetes mellitus without complication, without long-term current use of insulin (HCC) - CBC - Comprehensive metabolic panel - Lipid Panel - gabapentin (NEURONTIN) 300 MG capsule; TAKE 1 CAPSULE (300 MG TOTAL) BY MOUTH 2 (TWO) TIMES DAILY.  Dispense: 180 capsule; Refill: 0 - glipiZIDE (GLUCOTROL) 10 MG tablet; TAKE 1 TABLET (10 MG TOTAL) BY MOUTH 2 (TWO) TIMES DAILY BEFORE A MEAL.  Dispense: 180 tablet; Refill: 3 - metFORMIN (GLUCOPHAGE) 500 MG tablet; Take 1 tablet (500 mg total) by mouth 2 (two) times daily with a meal.  Dispense: 60 tablet; Refill: 11  2. Hyperlipidemia associated with type 2 diabetes mellitus ([place])  - Lipid Panel    3. Essential hypertension  - CBC - Comprehensive metabolic panel - Lipid Panel    4. Hypertension, unspecified type  - losartan (COZAAR) 50 MG tablet; TAKE 2 TABLETS (100 MG TOTAL) BY MOUTH DAILY.  Dispense: 180 tablet; Refill: 3 - metoprolol tartrate (LOPRESSOR) 25 MG tablet; Take 1 tablet (25 mg total) by mouth in the morning, at noon, and at bedtime.  Dispense: 90 tablet; Refill: 1    5. Neuropathy  - gabapentin (NEURONTIN) 300 MG capsule; TAKE 1 CAPSULE (300 MG TOTAL) BY MOUTH 2 (TWO) TIMES DAILY.  Dispense: 180 capsule; Refill: 0   6. Hyperlipidemia, unspecified hyperlipidemia type  - atorvastatin (LIPITOR) 40 MG tablet; TAKE 1 TABLET (40 MG TOTAL) BY MOUTH DAILY.  Dispense: 90 tablet; Refill: 0   7. Low energy  - DULoxetine (CYMBALTA) 30 MG capsule; Take 1 capsule (30 mg total) by mouth daily for 7 days, THEN 2 capsules (60 mg total) daily.  Dispense: 60 capsule; Refill: 11  8. Depression, unspecified depression type  - DULoxetine (CYMBALTA) 30 MG capsule; Take 1 capsule (30 mg total) by mouth daily for 7 days, THEN 2 capsules (60 mg total) daily.  Dispense: 60 capsule; Refill: 11  Follow up:  Follow up in 3 months or sooner if needed     Fenton Foy, NP 12/05/2021

## 2021-12-05 NOTE — Assessment & Plan Note (Signed)
-   CBC - Comprehensive metabolic panel - Lipid Panel - gabapentin (NEURONTIN) 300 MG capsule; TAKE 1 CAPSULE (300 MG TOTAL) BY MOUTH 2 (TWO) TIMES DAILY.  Dispense: 180 capsule; Refill: 0 - glipiZIDE (GLUCOTROL) 10 MG tablet; TAKE 1 TABLET (10 MG TOTAL) BY MOUTH 2 (TWO) TIMES DAILY BEFORE A MEAL.  Dispense: 180 tablet; Refill: 3 - metFORMIN (GLUCOPHAGE) 500 MG tablet; Take 1 tablet (500 mg total) by mouth 2 (two) times daily with a meal.  Dispense: 60 tablet; Refill: 11    2. Hyperlipidemia associated with type 2 diabetes mellitus ([place])  - Lipid Panel    3. Essential hypertension  - CBC - Comprehensive metabolic panel - Lipid Panel    4. Hypertension, unspecified type  - losartan (COZAAR) 50 MG tablet; TAKE 2 TABLETS (100 MG TOTAL) BY MOUTH DAILY.  Dispense: 180 tablet; Refill: 3 - metoprolol tartrate (LOPRESSOR) 25 MG tablet; Take 1 tablet (25 mg total) by mouth in the morning, at noon, and at bedtime.  Dispense: 90 tablet; Refill: 1    5. Neuropathy  - gabapentin (NEURONTIN) 300 MG capsule; TAKE 1 CAPSULE (300 MG TOTAL) BY MOUTH 2 (TWO) TIMES DAILY.  Dispense: 180 capsule; Refill: 0   6. Hyperlipidemia, unspecified hyperlipidemia type  - atorvastatin (LIPITOR) 40 MG tablet; TAKE 1 TABLET (40 MG TOTAL) BY MOUTH DAILY.  Dispense: 90 tablet; Refill: 0   7. Low energy  - DULoxetine (CYMBALTA) 30 MG capsule; Take 1 capsule (30 mg total) by mouth daily for 7 days, THEN 2 capsules (60 mg total) daily.  Dispense: 60 capsule; Refill: 11  8. Depression, unspecified depression type  - DULoxetine (CYMBALTA) 30 MG capsule; Take 1 capsule (30 mg total) by mouth daily for 7 days, THEN 2 capsules (60 mg total) daily.  Dispense: 60 capsule; Refill: 11  Follow up:  Follow up in 3 months or sooner if needed

## 2021-12-05 NOTE — Patient Instructions (Addendum)
1. Type 2 diabetes mellitus without complication, without long-term current use of insulin (HCC)  - CBC - Comprehensive metabolic panel - Lipid Panel - gabapentin (NEURONTIN) 300 MG capsule; TAKE 1 CAPSULE (300 MG TOTAL) BY MOUTH 2 (TWO) TIMES DAILY.  Dispense: 180 capsule; Refill: 0 - glipiZIDE (GLUCOTROL) 10 MG tablet; TAKE 1 TABLET (10 MG TOTAL) BY MOUTH 2 (TWO) TIMES DAILY BEFORE A MEAL.  Dispense: 180 tablet; Refill: 3 - metFORMIN (GLUCOPHAGE) 500 MG tablet; Take 1 tablet (500 mg total) by mouth 2 (two) times daily with a meal.  Dispense: 60 tablet; Refill: 11    2. Hyperlipidemia associated with type 2 diabetes mellitus ([place])  - Lipid Panel    3. Essential hypertension  - CBC - Comprehensive metabolic panel - Lipid Panel    4. Hypertension, unspecified type  - losartan (COZAAR) 50 MG tablet; TAKE 2 TABLETS (100 MG TOTAL) BY MOUTH DAILY.  Dispense: 180 tablet; Refill: 3 - metoprolol tartrate (LOPRESSOR) 25 MG tablet; Take 1 tablet (25 mg total) by mouth in the morning, at noon, and at bedtime.  Dispense: 90 tablet; Refill: 1    5. Neuropathy  - gabapentin (NEURONTIN) 300 MG capsule; TAKE 1 CAPSULE (300 MG TOTAL) BY MOUTH 2 (TWO) TIMES DAILY.  Dispense: 180 capsule; Refill: 0   6. Hyperlipidemia, unspecified hyperlipidemia type  - atorvastatin (LIPITOR) 40 MG tablet; TAKE 1 TABLET (40 MG TOTAL) BY MOUTH DAILY.  Dispense: 90 tablet; Refill: 0   7. Low energy  - DULoxetine (CYMBALTA) 30 MG capsule; Take 1 capsule (30 mg total) by mouth daily for 7 days, THEN 2 capsules (60 mg total) daily.  Dispense: 60 capsule; Refill: 11  8. Depression, unspecified depression type  - DULoxetine (CYMBALTA) 30 MG capsule; Take 1 capsule (30 mg total) by mouth daily for 7 days, THEN 2 capsules (60 mg total) daily.  Dispense: 60 capsule; Refill: 11  Follow up:  Follow up in 3 months or sooner if needed

## 2021-12-06 ENCOUNTER — Ambulatory Visit: Payer: Self-pay | Attending: Nurse Practitioner | Admitting: Nurse Practitioner

## 2021-12-24 ENCOUNTER — Telehealth: Payer: Self-pay | Admitting: Clinical

## 2021-12-24 NOTE — Telephone Encounter (Signed)
Integrated Behavioral Health Case Management Referral Note  12/24/2021 Name: Jon Harper MRN: [id number] DOB: June 16, 1955 [name] is a 66 y.o. year old male who sees Fenton Foy, NP for primary care. LCSW was consulted to assess patient's needs and assist the patient with [employer] .  Interpreter: No.   Interpreter Name & Language: none  Assessment: Patient experiencing Housing barriers.   Intervention: CSW called patient to follow up. PCP referred patient for homelessness after last visit. Patient reports he is currently staying with a friend. He declines to apply for project based housing, stating that they are not healthy and safe environments. Patient would like to find housing outside of [place]. Advised patient on how to apply for [employer], as they have wait lists open now. Provided CSW contact information.  SDOH (Social Determinants of Health) assessments performed: Yes  [place]: High Risk (12/24/2021)  Depression (PHQ2-9): High Risk (12/05/2021)  Financial Resource Strain: High Risk (12/24/2021)  Tobacco Use: Low Risk  (12/05/2021)    Review of patient status, including review of consultants reports, relevant laboratory and other test results, and collaboration with appropriate care team members and the patient's provider was performed as part of comprehensive patient evaluation and provision of services.    Estanislado Emms, [facility] [phone]

## 2021-12-26 ENCOUNTER — Telehealth: Payer: Self-pay | Admitting: Clinical

## 2021-12-26 NOTE — Telephone Encounter (Signed)
Integrated Behavioral Health General Follow Up Note  12/26/2021 Name: Jon Harper MRN: [id number] DOB: March 20, 1955 [name] is a 66 y.o. year old male who sees Fenton Foy, NP for primary care. LCSW was initially consulted to assist the patient with [employer] .  Interpreter: No.   Interpreter Name & Language: none  Assessment: Patient experiencing housing barriers.  Ongoing Intervention: Today patient called CSW and stated he would like more housing information, as he needs to leave his current situation as soon as possible. Advised patient that any housing option is going to take time, as he will need to apply either with individual landlords or with housing authorities. Provided patient with information to apply over the phone with the [employer] for their project based voucher, as patient now amenable to apply with them. Also advised patient of other housing options in [place] and he will need to apply directly with those landlords. Emailed and mailed patient this information.  Review of patient status, including review of consultants reports, relevant laboratory and other test results, and collaboration with appropriate care team members and the patient's provider was performed as part of comprehensive patient evaluation and provision of services.    Estanislado Emms, [facility] [phone]

## 2022-01-07 ENCOUNTER — Other Ambulatory Visit: Payer: Self-pay

## 2022-01-09 ENCOUNTER — Other Ambulatory Visit: Payer: Self-pay

## 2022-02-11 ENCOUNTER — Telehealth: Payer: Self-pay | Admitting: Nurse Practitioner

## 2022-02-11 DIAGNOSIS — E785 Hyperlipidemia, unspecified: Secondary | ICD-10-CM

## 2022-02-12 ENCOUNTER — Other Ambulatory Visit: Payer: Self-pay

## 2022-02-18 ENCOUNTER — Other Ambulatory Visit: Payer: Self-pay

## 2022-03-06 ENCOUNTER — Encounter: Payer: Self-pay | Admitting: Nurse Practitioner

## 2022-03-06 ENCOUNTER — Other Ambulatory Visit: Payer: Self-pay

## 2022-03-06 ENCOUNTER — Ambulatory Visit: Payer: Medicaid Other | Admitting: Nurse Practitioner

## 2022-03-06 VITALS — BP 145/90 | HR 77 | Ht 71.0 in | Wt 256.0 lb

## 2022-03-06 DIAGNOSIS — G629 Polyneuropathy, unspecified: Secondary | ICD-10-CM | POA: Diagnosis not present

## 2022-03-06 DIAGNOSIS — I10 Essential (primary) hypertension: Secondary | ICD-10-CM | POA: Diagnosis not present

## 2022-03-06 DIAGNOSIS — E1169 Type 2 diabetes mellitus with other specified complication: Secondary | ICD-10-CM | POA: Diagnosis not present

## 2022-03-06 DIAGNOSIS — Z23 Encounter for immunization: Secondary | ICD-10-CM | POA: Diagnosis not present

## 2022-03-06 DIAGNOSIS — E785 Hyperlipidemia, unspecified: Secondary | ICD-10-CM

## 2022-03-06 DIAGNOSIS — Z1211 Encounter for screening for malignant neoplasm of colon: Secondary | ICD-10-CM

## 2022-03-06 DIAGNOSIS — B351 Tinea unguium: Secondary | ICD-10-CM | POA: Insufficient documentation

## 2022-03-06 DIAGNOSIS — E119 Type 2 diabetes mellitus without complications: Secondary | ICD-10-CM

## 2022-03-06 LAB — POCT GLYCOSYLATED HEMOGLOBIN (HGB A1C): Hemoglobin A1C: 7.8 % — AB (ref 4.0–5.6)

## 2022-03-06 MED ORDER — GLIPIZIDE 10 MG PO TABS
10.0000 mg | ORAL_TABLET | Freq: Two times a day (BID) | ORAL | 3 refills | Status: DC
  Filled 2022-03-06: qty 180, 90d supply, fill #0
  Filled 2022-05-30: qty 180, 90d supply, fill #1
  Filled 2022-09-04 – 2022-12-08 (×2): qty 180, 90d supply, fill #2

## 2022-03-06 MED ORDER — EMPAGLIFLOZIN 10 MG PO TABS
10.0000 mg | ORAL_TABLET | Freq: Every day | ORAL | 0 refills | Status: DC
  Filled 2022-03-06: qty 90, 90d supply, fill #0

## 2022-03-06 MED ORDER — METOPROLOL TARTRATE 25 MG PO TABS
25.0000 mg | ORAL_TABLET | Freq: Three times a day (TID) | ORAL | 1 refills | Status: DC
  Filled 2022-03-06: qty 90, 30d supply, fill #0
  Filled 2022-05-30: qty 90, 30d supply, fill #1

## 2022-03-06 MED ORDER — HYDROCHLOROTHIAZIDE 12.5 MG PO TABS
12.5000 mg | ORAL_TABLET | Freq: Every day | ORAL | 0 refills | Status: DC
  Filled 2022-03-06: qty 60, 60d supply, fill #0

## 2022-03-06 MED ORDER — GABAPENTIN 300 MG PO CAPS
300.0000 mg | ORAL_CAPSULE | Freq: Two times a day (BID) | ORAL | 0 refills | Status: DC
  Filled 2022-03-06 – 2022-03-19 (×2): qty 180, 90d supply, fill #0

## 2022-03-06 MED ORDER — ATORVASTATIN CALCIUM 40 MG PO TABS
40.0000 mg | ORAL_TABLET | Freq: Every day | ORAL | 0 refills | Status: DC
  Filled 2022-03-06: qty 90, 90d supply, fill #0

## 2022-03-06 MED ORDER — LOSARTAN POTASSIUM 50 MG PO TABS
100.0000 mg | ORAL_TABLET | Freq: Every day | ORAL | 3 refills | Status: DC
  Filled 2022-03-06: qty 180, 90d supply, fill #0
  Filled 2022-05-30: qty 180, 90d supply, fill #1

## 2022-03-06 NOTE — Assessment & Plan Note (Signed)
Quite extensive on bilateral big toes ,patient referred to podiatry for management

## 2022-03-06 NOTE — Progress Notes (Signed)
Established Patient Office Visit  Subjective:  Patient ID: Jon Harper, male    DOB: [birth year]  Age: 66 y.o. MRN: [id number]  CC:  Chief Complaint  Patient presents with   Follow-up    HPI Jon Harper is a 66 y.o. male with past medical history of 2 diabetes, hypertension, obesity, who presents for follow-up for his chronic medical conditions.  Type 2 diabetes.  Currently on glipizide 10 mg twice daily.  Patient stated that he has cut back on eating high carb diet including soda, bread, potatoes.  He does not exercise.  Had his diabetic eye exam done 2 weeks ago.  Patient denies polyphagia, polyuria, polydipsia.  Has metformin ordered but he has not been taking the medication due to GI side effects  Hypertension.  He is currently on metoprolol 25 mg twice daily, losartan 100 mg daily, patient denies dizziness, chest pain, headache, edema.   Due for colon cancer screening, Cologuard test ordered.   Patient was encouraged to get Tdap vaccine and shingles vaccine at the pharmacy. Flu vaccine given in the office today  Past Medical History:  Diagnosis Date   Chronic low back pain    Diabetes mellitus    Hypertension    Neck pain    Neck pain    Skin rash 10/2018   Tooth abscess 10/2018    Past Surgical History:  Procedure Laterality Date   right nephrectomy      Family History  Problem Relation Age of Onset   Diabetes Mother        non-insulin    Diabetes Father        non-insulin    Diabetes Sister        non-insulin     Social History   Socioeconomic History   Marital status: Single    Spouse name: Not on file   Number of children: Not on file   Years of education: Not on file   Highest education level: Not on file  Occupational History   Not on file  Tobacco Use   Smoking status: Never   Smokeless tobacco: Never  Vaping Use   Vaping Use: Never used  Substance and Sexual Activity   Alcohol use: No   Drug use: Yes    Types: Marijuana     Comment: occ   Sexual activity: Yes    Birth control/protection: Condom  Other Topics Concern   Not on file  Social History Narrative   Divorced.   Does exercise as much as possible.  Tries to go to gym at least 3 days a week.   Social Determinants of Health   Financial Resource Strain: High Risk (12/24/2021)   Overall Financial Resource Strain (CARDIA)    Difficulty of Paying Living Expenses: Very hard  Food Insecurity: Not on file  Transportation Needs: Not on file  Physical Activity: Not on file  Stress: Not on file  Social Connections: Not on file  Intimate Partner Violence: Not on file    Outpatient Medications Prior to Visit  Medication Sig Dispense Refill   cyclobenzaprine (FLEXERIL) 10 MG tablet Take 1 tablet (10 mg total) by mouth 2 (two) times daily as needed for muscle spasms. 20 tablet 0   atorvastatin (LIPITOR) 40 MG tablet Take 1 tablet (40 mg total) by mouth daily. 90 tablet 0   gabapentin (NEURONTIN) 300 MG capsule Take 1 capsule (300 mg total) by mouth 2 (two) times daily. 180 capsule 0   glipiZIDE (GLUCOTROL) 10  MG tablet TAKE 1 TABLET (10 MG TOTAL) BY MOUTH 2 (TWO) TIMES DAILY BEFORE A MEAL. 180 tablet 3   losartan (COZAAR) 50 MG tablet Take 2 tablets (100 mg total) by mouth daily. 180 tablet 3   metoprolol tartrate (LOPRESSOR) 25 MG tablet Take 1 tablet (25 mg total) by mouth in the morning, at noon, and at bedtime. 90 tablet 1   acetaminophen (TYLENOL 8 HOUR) 650 MG CR tablet Take 1 tablet (650 mg total) by mouth every 8 (eight) hours as needed for pain. 20 tablet 0   DULoxetine (CYMBALTA) 30 MG capsule Take 1 capsule (30 mg total) by mouth daily for 7 days, THEN 2 capsules (60 mg total) daily. (Patient not taking: Reported on 03/06/2022) 60 capsule 11   hydrocortisone cream 0.5 % Apply 1 application topically 2 (two) times daily. (Patient not taking: Reported on 09/06/2021) 30 g 3   meclizine (ANTIVERT) 25 MG tablet Take 1 tablet (25 mg total) by mouth 3 (three)  times daily as needed for dizziness. (Patient not taking: Reported on 03/06/2022) 30 tablet 0   metFORMIN (GLUCOPHAGE) 500 MG tablet Take 1 tablet (500 mg total) by mouth 2 (two) times daily with a meal. (Patient not taking: Reported on 03/06/2022) 60 tablet 11   oxyCODONE (ROXICODONE) 5 MG immediate release tablet Take 0.5-1 tablets (2.5-5 mg total) by mouth every 6 (six) hours as needed for severe pain. (Patient not taking: Reported on 12/05/2021) 6 tablet 0   No facility-administered medications prior to visit.    No Known Allergies  ROS Review of Systems  Constitutional: Negative.  Negative for activity change, appetite change, chills, diaphoresis and fatigue.  Respiratory: Negative.  Negative for apnea, choking, chest tightness, shortness of breath, wheezing and stridor.   Cardiovascular: Negative.  Negative for chest pain, palpitations and leg swelling.  Endocrine: Negative for heat intolerance, polydipsia, polyphagia and polyuria.  Genitourinary:  Negative for difficulty urinating, dysuria and enuresis.  Musculoskeletal:  Negative for arthralgias, back pain and gait problem.  Neurological: Negative.  Negative for dizziness, facial asymmetry and headaches.  Psychiatric/Behavioral: Negative.  Negative for agitation, behavioral problems, confusion and decreased concentration.       Objective:    Physical Exam Constitutional:      General: He is not in acute distress.    Appearance: He is obese. He is not ill-appearing or diaphoretic.  Eyes:     General: No scleral icterus.       Right eye: No discharge.        Left eye: No discharge.     Extraocular Movements: Extraocular movements intact.  Cardiovascular:     Rate and Rhythm: Normal rate and regular rhythm.     Pulses: Normal pulses.     Heart sounds: Normal heart sounds. No murmur heard.    No friction rub. No gallop.  Pulmonary:     Effort: Pulmonary effort is normal. No respiratory distress.     Breath sounds: Normal  breath sounds. No stridor. No wheezing, rhonchi or rales.  Chest:     Chest wall: No tenderness.  Abdominal:     General: There is no distension.     Palpations: There is no mass.     Tenderness: There is no abdominal tenderness. There is no right CVA tenderness, left CVA tenderness, guarding or rebound.     Hernia: No hernia is present.  Musculoskeletal:        General: No swelling, tenderness, deformity or signs of injury.  Right lower leg: No edema.     Left lower leg: No edema.  Skin:    General: Skin is warm and dry.     Capillary Refill: Capillary refill takes less than 2 seconds.     Comments: Bilateral big toe nails appears thickened and discolored.   Neurological:     Mental Status: He is alert.     Cranial Nerves: No cranial nerve deficit.     Sensory: No sensory deficit.     Motor: No weakness.     Coordination: Coordination normal.     Gait: Gait normal.  Psychiatric:        Mood and Affect: Mood normal.        Behavior: Behavior normal.        Thought Content: Thought content normal.        Judgment: Judgment normal.     BP (!) 145/90   Pulse 77   Ht _0  (1.803 m)   Wt 256 lb (116.1 kg)   SpO2 96%   BMI 35.70 kg/m  Wt Readings from Last 3 Encounters:  03/06/22 256 lb (116.1 kg)  12/05/21 246 lb 4 oz (111.7 kg)  09/06/21 249 lb 3.2 oz (113 kg)    Lab Results  Component Value Date   TSH 1.170 01/25/2020   Lab Results  Component Value Date   WBC 7.0 09/06/2021   HGB 13.8 09/06/2021   HCT 41.0 09/06/2021   MCV 82 09/06/2021   PLT 179 09/06/2021   Lab Results  Component Value Date   NA 140 09/06/2021   K 5.2 09/06/2021   CO2 22 09/06/2021   GLUCOSE 156 (H) 09/06/2021   BUN 16 09/06/2021   CREATININE 1.19 09/06/2021   BILITOT 0.4 09/06/2021   ALKPHOS 74 09/06/2021   AST 21 09/06/2021   ALT 48 (H) 09/06/2021   PROT 7.6 09/06/2021   ALBUMIN 4.4 09/06/2021   CALCIUM 9.7 09/06/2021   ANIONGAP 9 07/23/2021   EGFR 68 09/06/2021   Lab  Results  Component Value Date   CHOL 141 03/29/2021   Lab Results  Component Value Date   HDL 64 03/29/2021   Lab Results  Component Value Date   LDLCALC 66 03/29/2021   Lab Results  Component Value Date   TRIG 46 03/29/2021   Lab Results  Component Value Date   CHOLHDL 2.2 03/29/2021   Lab Results  Component Value Date   HGBA1C 7.8 (A) 03/06/2022      Assessment & Plan:   Problem List Items Addressed This Visit       Cardiovascular and Mediastinum   Hypertension    BP Readings from Last 3 Encounters:  03/06/22 (!) 145/90  12/05/21 (!) 152/94  09/06/21 (!) 154/95  Chronic uncontrolled condition currently on losartan 100 mg daily, metoprolol 25 mg twice daily Start hydrochlorothiazide 12.5 mg daily continue current dose of losartan and metoprolol.  Blood pressure goal is less than 130/80. DASH diet advised patient encouraged to engage in regular moderate exercises at least 150 minutes weekly. Plans for BMP in 2 weeks. Follow-up in 2 weeks in the office with nurse to recheck blood pressure, follow-up with PCP in 4 weeks in the office      Relevant Medications   atorvastatin (LIPITOR) 40 MG tablet   metoprolol tartrate (LOPRESSOR) 25 MG tablet   losartan (COZAAR) 50 MG tablet   hydrochlorothiazide (HYDRODIURIL) 12.5 MG tablet     Endocrine   Type 2 diabetes mellitus without complication,  without long-term current use of insulin (HCC) - Primary    Lab Results  Component Value Date   HGBA1C 7.8 (A) 03/06/2022  A1c much improved from 8.6 Continue glipizide 10 mg twice daily Start Jardiance 10 mg daily, need to avoid fried food, fatty food, eat smaller portion of meals while taking Jardiance to help minimize risk of GI upset was discussed. Patient counseled on low-carb modified diet Check urine albumin creatinine labs today. Follow-up in 4 weeks with PCP. Diabetic foot exam completed      Relevant Medications   atorvastatin (LIPITOR) 40 MG tablet   losartan  (COZAAR) 50 MG tablet   gabapentin (NEURONTIN) 300 MG capsule   glipiZIDE (GLUCOTROL) 10 MG tablet   empagliflozin (JARDIANCE) 10 MG TABS tablet   Other Relevant Orders   POCT glycosylated hemoglobin (Hb A1C) (Completed)   Urine microalbumin-creatinine with uACR   CMP14+EGFR     Nervous and Auditory   Neuropathy    Gabapentin 300 mg twice daily refilled      Relevant Medications   gabapentin (NEURONTIN) 300 MG capsule     Musculoskeletal and Integument   Onychomycosis    Quite extensive on bilateral big toes ,patient referred to podiatry for management       Relevant Orders   Ambulatory referral to Podiatry     Other   Hyperlipidemia    Lab Results  Component Value Date   CHOL 141 03/29/2021   HDL 64 03/29/2021   LDLCALC 66 03/29/2021   TRIG 46 03/29/2021   CHOLHDL 2.2 03/29/2021  Currently well-controlled continue atorvastatin 40 mg daily. Avoid fried fatty foods check lipid panel at next visit since he is not fasting today       Relevant Medications   atorvastatin (LIPITOR) 40 MG tablet   metoprolol tartrate (LOPRESSOR) 25 MG tablet   losartan (COZAAR) 50 MG tablet   hydrochlorothiazide (HYDRODIURIL) 12.5 MG tablet   Need for immunization against influenza    Patient educated on CDC recommendation for the influenza vaccine. Verbal consent was obtained from the patient, vaccine administered by nurse, no sign of adverse reactions noted at this time. Patient education on arm soreness and use of tyleno for this patient  was discussed. Patient educated on the signs and symptoms of adverse effect and advise to contact the office if they occur      Relevant Orders   Flu Vaccine QUAD 61moIM (Fluarix, Fluzone & Alfiuria Quad PF) (Completed)   Other Visit Diagnoses     Screening for colon cancer       Relevant Orders   Cologuard       Meds ordered this encounter  Medications   atorvastatin (LIPITOR) 40 MG tablet    Sig: Take 1 tablet (40 mg total) by mouth  daily.    Dispense:  90 tablet    Refill:  0   metoprolol tartrate (LOPRESSOR) 25 MG tablet    Sig: Take 1 tablet (25 mg total) by mouth in the morning, at noon, and at bedtime.    Dispense:  90 tablet    Refill:  1   losartan (COZAAR) 50 MG tablet    Sig: Take 2 tablets (100 mg total) by mouth daily.    Dispense:  180 tablet    Refill:  3   gabapentin (NEURONTIN) 300 MG capsule    Sig: Take 1 capsule (300 mg total) by mouth 2 (two) times daily.    Dispense:  180 capsule    Refill:  0   glipiZIDE (GLUCOTROL) 10 MG tablet    Sig: TAKE 1 TABLET (10 MG TOTAL) BY MOUTH 2 (TWO) TIMES DAILY BEFORE A MEAL.    Dispense:  180 tablet    Refill:  3   empagliflozin (JARDIANCE) 10 MG TABS tablet    Sig: Take 1 tablet (10 mg total) by mouth daily before breakfast.    Dispense:  90 tablet    Refill:  0   hydrochlorothiazide (HYDRODIURIL) 12.5 MG tablet    Sig: Take 1 tablet (12.5 mg total) by mouth daily.    Dispense:  60 tablet    Refill:  0    Follow-up: Return in about 4 weeks (around 04/03/2022) for T2DM.    Renee Rival, FNP

## 2022-03-06 NOTE — Patient Instructions (Addendum)
Please get your TDAP vaccine and shingles vaccine at the pharmacy   Around 3 times per week, check your blood pressure 2 times per day. once in the morning and once in the evening. The readings should be at least one minute apart. Write down these values and bring them to your next nurse visit/appointment.  When you check your BP, make sure you have been doing something calm/relaxing 5 minutes prior to checking. Both feet should be flat on the floor and you should be sitting. Use your left arm and make sure it is in a relaxed position (on a table), and that the cuff is at the approximate level/height of your heart.  Blood pressure 130/80.   Goal for fasting blood sugar ranges from 80 to 120 and 2 hours after any meal or at bedtime should be between 130 to 170.    1. Type 2 diabetes mellitus without complication, without long-term current use of insulin (HCC)  - POCT glycosylated hemoglobin (Hb A1C) - gabapentin (NEURONTIN) 300 MG capsule; Take 1 capsule (300 mg total) by mouth 2 (two) times daily.  Dispense: 180 capsule; Refill: 0 - glipiZIDE (GLUCOTROL) 10 MG tablet; TAKE 1 TABLET (10 MG TOTAL) BY MOUTH 2 (TWO) TIMES DAILY BEFORE A MEAL.  Dispense: 180 tablet; Refill: 3 - Please start empagliflozin (JARDIANCE) 10 MG TABS tablet; Take 1 tablet (10 mg total) by mouth daily before breakfast.  Dispense: 90 tablet; Refill: 0  2. Hyperlipidemia, unspecified hyperlipidemia type  - atorvastatin (LIPITOR) 40 MG tablet; Take 1 tablet (40 mg total) by mouth daily.  Dispense: 90 tablet; Refill: 0  3. Hypertension, unspecified type  - metoprolol tartrate (LOPRESSOR) 25 MG tablet; Take 1 tablet (25 mg total) by mouth in the morning, at noon, and at bedtime.  Dispense: 90 tablet; Refill: 1 - losartan (COZAAR) 50 MG tablet; Take 2 tablets (100 mg total) by mouth daily.  Dispense: 180 tablet; Refill: 3  4. Neuropathy  - gabapentin (NEURONTIN) 300 MG capsule; Take 1 capsule (300 mg total) by mouth 2 (two)  times daily.  Dispense: 180 capsule; Refill: 0

## 2022-03-06 NOTE — Assessment & Plan Note (Addendum)
Lab Results  Component Value Date   HGBA1C 7.8 (A) 03/06/2022  A1c much improved from 8.6 Continue glipizide 10 mg twice daily Start Jardiance 10 mg daily, need to avoid fried food, fatty food, eat smaller portion of meals while taking Jardiance to help minimize risk of GI upset was discussed. Patient counseled on low-carb modified diet Check urine albumin creatinine labs today. Follow-up in 4 weeks with PCP. Diabetic foot exam completed

## 2022-03-06 NOTE — Assessment & Plan Note (Signed)
Patient educated on CDC recommendation for the influenza vaccine. Verbal consent was obtained from the patient, vaccine administered by nurse, no sign of adverse reactions noted at this time. Patient education on arm soreness and use of tyleno for this patient  was discussed. Patient educated on the signs and symptoms of adverse effect and advise to contact the office if they occur

## 2022-03-06 NOTE — Assessment & Plan Note (Addendum)
BP Readings from Last 3 Encounters:  03/06/22 (!) 145/90  12/05/21 (!) 152/94  09/06/21 (!) 154/95  Chronic uncontrolled condition currently on losartan 100 mg daily, metoprolol 25 mg twice daily Start hydrochlorothiazide 12.5 mg daily continue current dose of losartan and metoprolol.  Blood pressure goal is less than 130/80. DASH diet advised patient encouraged to engage in regular moderate exercises at least 150 minutes weekly. Plans for BMP in 2 weeks.  follow-up with PCP in 4 weeks in the office

## 2022-03-06 NOTE — Assessment & Plan Note (Signed)
Lab Results  Component Value Date   CHOL 141 03/29/2021   HDL 64 03/29/2021   LDLCALC 66 03/29/2021   TRIG 46 03/29/2021   CHOLHDL 2.2 03/29/2021  Currently well-controlled continue atorvastatin 40 mg daily. Avoid fried fatty foods check lipid panel at next visit since he is not fasting today

## 2022-03-06 NOTE — Assessment & Plan Note (Signed)
Gabapentin 300 mg twice daily refilled

## 2022-03-07 ENCOUNTER — Other Ambulatory Visit: Payer: Self-pay

## 2022-03-07 LAB — CMP14+EGFR
ALT: 58 IU/L — ABNORMAL HIGH (ref 0–44)
AST: 29 IU/L (ref 0–40)
Albumin/Globulin Ratio: 1.5 (ref 1.2–2.2)
Albumin: 4.5 g/dL (ref 3.9–4.9)
Alkaline Phosphatase: 69 IU/L (ref 44–121)
BUN/Creatinine Ratio: 13 (ref 10–24)
BUN: 20 mg/dL (ref 8–27)
Bilirubin Total: 0.3 mg/dL (ref 0.0–1.2)
CO2: 16 mmol/L — ABNORMAL LOW (ref 20–29)
Calcium: 10 mg/dL (ref 8.6–10.2)
Chloride: 106 mmol/L (ref 96–106)
Creatinine, Ser: 1.58 mg/dL — ABNORMAL HIGH (ref 0.76–1.27)
Globulin, Total: 3 g/dL (ref 1.5–4.5)
Glucose: 172 mg/dL — ABNORMAL HIGH (ref 70–99)
Potassium: 4.7 mmol/L (ref 3.5–5.2)
Sodium: 142 mmol/L (ref 134–144)
Total Protein: 7.5 g/dL (ref 6.0–8.5)
eGFR: 49 mL/min/{1.73_m2} — ABNORMAL LOW (ref >59)

## 2022-03-07 LAB — MICROALBUMIN / CREATININE URINE RATIO
Creatinine, Urine: 79.7 mg/dL
Microalb/Creat Ratio: 655 mg/g creat — ABNORMAL HIGH (ref 0–29)
Microalbumin, Urine: 521.8 ug/mL

## 2022-03-07 NOTE — Telephone Encounter (Signed)
Caller & Relationship to patient:  MRN #  [id number]   Call Back Number:   Date of Last Office Visit: 12/26/2021     Date of Next Office Visit: 03/06/2022    Medication(s) to be Refilled: refill # [id number] is what he gave as a refill  Preferred Pharmacy:   ** Please notify patient to allow 48-72 hours to process** **Let patient know to contact pharmacy at the end of the day to make sure medication is ready. ** **If patient has not been seen in a year or longer, book an appointment **Advise to use MyChart for refill requests OR to contact their pharmacy

## 2022-03-08 ENCOUNTER — Other Ambulatory Visit: Payer: Self-pay | Admitting: Nurse Practitioner

## 2022-03-08 DIAGNOSIS — R7989 Other specified abnormal findings of blood chemistry: Secondary | ICD-10-CM

## 2022-03-08 DIAGNOSIS — I10 Essential (primary) hypertension: Secondary | ICD-10-CM

## 2022-03-08 NOTE — Progress Notes (Signed)
Kidney function is decreased compared to last labs.  He should avoid use of ibuprofen, Aleve drink at least 64 ounces of water daily.  He should start taking Jardiance as discussed for kidney protection.  Follow-up as planned.  We will recheck his BMP in 2 weeks.  Please schedule an appointment for patient to return to the office for BMP in 2 weeks.  Liver enzyme is slightly elevated avoid use of alcohol, excessive Tylenol.

## 2022-03-12 NOTE — Telephone Encounter (Signed)
Pt was called to advise I will need to know the name of the medication that needs to be filled. No answer and LVM. [place]

## 2022-03-19 ENCOUNTER — Other Ambulatory Visit: Payer: Self-pay

## 2022-03-20 ENCOUNTER — Other Ambulatory Visit: Payer: Self-pay

## 2022-04-03 ENCOUNTER — Ambulatory Visit: Payer: Self-pay | Admitting: Nurse Practitioner

## 2022-05-01 ENCOUNTER — Other Ambulatory Visit: Payer: Self-pay

## 2022-05-01 ENCOUNTER — Encounter (HOSPITAL_BASED_OUTPATIENT_CLINIC_OR_DEPARTMENT_OTHER): Payer: Self-pay

## 2022-05-01 ENCOUNTER — Ambulatory Visit: Payer: Medicaid Other | Admitting: Nurse Practitioner

## 2022-05-01 ENCOUNTER — Encounter: Payer: Self-pay | Admitting: Nurse Practitioner

## 2022-05-01 ENCOUNTER — Emergency Department (HOSPITAL_BASED_OUTPATIENT_CLINIC_OR_DEPARTMENT_OTHER)
Admission: EM | Admit: 2022-05-01 | Discharge: 2022-05-01 | Discharge status: Against Medical Advice | Payer: Medicaid Other | Attending: Emergency Medicine | Admitting: Emergency Medicine

## 2022-05-01 VITALS — BP 139/85 | HR 76 | Temp 97.5°F | Ht 72.0 in | Wt 252.6 lb

## 2022-05-01 DIAGNOSIS — M5441 Lumbago with sciatica, right side: Secondary | ICD-10-CM | POA: Diagnosis not present

## 2022-05-01 DIAGNOSIS — Z5321 Procedure and treatment not carried out due to patient leaving prior to being seen by health care provider: Secondary | ICD-10-CM | POA: Diagnosis not present

## 2022-05-01 DIAGNOSIS — M5442 Lumbago with sciatica, left side: Secondary | ICD-10-CM | POA: Diagnosis not present

## 2022-05-01 DIAGNOSIS — X500XXA Overexertion from strenuous movement or load, initial encounter: Secondary | ICD-10-CM | POA: Diagnosis not present

## 2022-05-01 DIAGNOSIS — Y990 Civilian activity done for income or pay: Secondary | ICD-10-CM | POA: Insufficient documentation

## 2022-05-01 DIAGNOSIS — G8929 Other chronic pain: Secondary | ICD-10-CM | POA: Diagnosis not present

## 2022-05-01 DIAGNOSIS — M545 Low back pain, unspecified: Secondary | ICD-10-CM | POA: Diagnosis not present

## 2022-05-01 MED ORDER — OXYCODONE-ACETAMINOPHEN 5-325 MG PO TABS
1.0000 | ORAL_TABLET | ORAL | Status: DC | PRN
  Administered 2022-05-01: 1 via ORAL
  Filled 2022-05-01: qty 1

## 2022-05-01 MED ORDER — CYCLOBENZAPRINE HCL 10 MG PO TABS
10.0000 mg | ORAL_TABLET | Freq: Two times a day (BID) | ORAL | 0 refills | Status: DC | PRN
  Filled 2022-05-01: qty 30, 15d supply, fill #0

## 2022-05-01 MED ORDER — GABAPENTIN 300 MG PO CAPS
300.0000 mg | ORAL_CAPSULE | Freq: Two times a day (BID) | ORAL | 0 refills | Status: DC
  Filled 2022-05-01 – 2022-05-30 (×2): qty 180, 90d supply, fill #0
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
## 2022-05-01 NOTE — ED Triage Notes (Signed)
Patient here POV from Home.  Endorses Lower Back Pain that began Monday. States he began a new Job Monday that involves heavy lifting and bending causing Pain to worsen as he already had present back pain.   Uncomfortable during triage. A&Ox4. GCS 15. BIB Wheelchair.

## 2022-05-01 NOTE — Patient Instructions (Signed)
.   Chronic bilateral low back pain with bilateral sciatica  - gabapentin (NEURONTIN) 300 MG capsule; Take 1 capsule (300 mg total) by mouth 2 (two) times daily.  Dispense: 180 capsule; Refill: 0 - Ambulatory referral to Orthopedic Surgery - cyclobenzaprine (FLEXERIL) 10 MG tablet; Take 1 tablet (10 mg total) by mouth 2 (two) times daily as needed for muscle spasms.  Dispense: 30 tablet; Refill: 0    It is important that you exercise regularly at least 30 minutes 5 times a week as tolerated  Think about what you will eat, plan ahead. Choose " clean, green, fresh or frozen" over canned, processed or packaged foods which are more sugary, salty and fatty. 70 to 75% of food eaten should be vegetables and fruit. Three meals at set times with snacks allowed between meals, but they must be fruit or vegetables. Aim to eat over a 12 hour period , example 7 am to 7 pm, and STOP after  your last meal of the day. Drink water,generally about 64 ounces per day, no other drink is as healthy. Fruit juice is best enjoyed in a healthy way, by EATING the fruit.  Thanks for choosing [facility] we consider it a privelige to serve you.

## 2022-05-01 NOTE — ED Notes (Signed)
Pt in obs 1 via wheel chair, pt c/o back pain that he got when lifting, pt denies numbness or tingling to legs, positive sensation to touch, pt can stand, pt reports intense back pain.

## 2022-05-01 NOTE — Assessment & Plan Note (Addendum)
1. Chronic bilateral low back pain with bilateral sciatica  - gabapentin (NEURONTIN) 300 MG capsule; Take 1 capsule (300 mg total) by mouth 2 (two) times daily.  Dispense: 180 capsule; Refill: 0 - Ambulatory referral to Orthopedic Surgery - cyclobenzaprine (FLEXERIL) 10 MG tablet; Take 1 tablet (10 mg total) by mouth 2 (two) times daily as needed for muscle spasms.  Dispense: 30 tablet; Refill: 0  I plan given Toradol injection and obtaining an x-ray but Patient stated that she will go to the emergency room Application of heat and ice encouraged Take Tylenol 650 mg every 6-8 hours as needed for pain

## 2022-05-01 NOTE — ED Notes (Signed)
Patient exited Examination Room stating he is leaving. Patient advised to stay for EDP Assessment but left stating "I'll come back alter if I need to". Ambulatory and in NAD upon leaving.

## 2022-05-02 ENCOUNTER — Telehealth: Payer: Self-pay

## 2022-05-02 NOTE — Transitions of Care (Post Inpatient/ED Visit) (Signed)
   05/02/2022  Name: Jon Harper MRN: [id number] DOB: October 21, 1955  Today's TOC FU Call Status: Today's TOC FU Call Status:: Unsuccessul Call (1st Attempt) Unsuccessful Call (1st Attempt) Date: 05/02/22  Attempted to reach the patient regarding the most recent Inpatient/ED visit.  Follow Up Plan: Additional outreach attempts will be made to reach the patient to complete the Transitions of Care (Post Inpatient/ED visit) call.   [place] RMA

## 2022-05-03 ENCOUNTER — Other Ambulatory Visit: Payer: Self-pay

## 2022-05-08 ENCOUNTER — Other Ambulatory Visit: Payer: Self-pay | Admitting: Nurse Practitioner

## 2022-05-08 DIAGNOSIS — I10 Essential (primary) hypertension: Secondary | ICD-10-CM

## 2022-05-09 ENCOUNTER — Other Ambulatory Visit: Payer: Self-pay

## 2022-05-09 MED ORDER — HYDROCHLOROTHIAZIDE 12.5 MG PO TABS
12.5000 mg | ORAL_TABLET | Freq: Every day | ORAL | 0 refills | Status: DC
  Filled 2022-05-09: qty 90, 90d supply, fill #0

## 2022-05-14 ENCOUNTER — Other Ambulatory Visit: Payer: Self-pay

## 2022-05-15 ENCOUNTER — Other Ambulatory Visit: Payer: Self-pay
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
## 2022-05-21 ENCOUNTER — Other Ambulatory Visit: Payer: Self-pay

## 2022-05-21 ENCOUNTER — Ambulatory Visit: Payer: Medicaid Other | Admitting: Physical Therapy

## 2022-05-21 ENCOUNTER — Ambulatory Visit: Payer: Medicare Other | Attending: Neurosurgery | Admitting: Physical Therapy

## 2022-05-21 ENCOUNTER — Encounter: Payer: Self-pay | Admitting: Physical Therapy

## 2022-05-21 DIAGNOSIS — M546 Pain in thoracic spine: Secondary | ICD-10-CM | POA: Insufficient documentation

## 2022-05-21 DIAGNOSIS — M5459 Other low back pain: Secondary | ICD-10-CM

## 2022-05-21 DIAGNOSIS — M6281 Muscle weakness (generalized): Secondary | ICD-10-CM | POA: Insufficient documentation

## 2022-05-21 DIAGNOSIS — M5441 Lumbago with sciatica, right side: Secondary | ICD-10-CM | POA: Diagnosis not present

## 2022-05-21 DIAGNOSIS — R252 Cramp and spasm: Secondary | ICD-10-CM

## 2022-05-24 ENCOUNTER — Ambulatory Visit: Payer: Medicare Other | Admitting: Rehabilitative and Restorative Service Providers"

## 2022-05-24 ENCOUNTER — Encounter: Payer: Self-pay | Admitting: Rehabilitative and Restorative Service Providers"

## 2022-05-24 DIAGNOSIS — M5441 Lumbago with sciatica, right side: Secondary | ICD-10-CM | POA: Diagnosis not present

## 2022-05-24 DIAGNOSIS — M546 Pain in thoracic spine: Secondary | ICD-10-CM

## 2022-05-24 DIAGNOSIS — R252 Cramp and spasm: Secondary | ICD-10-CM

## 2022-05-24 DIAGNOSIS — M6281 Muscle weakness (generalized): Secondary | ICD-10-CM

## 2022-05-24 DIAGNOSIS — M5459 Other low back pain: Secondary | ICD-10-CM

## 2022-05-24 NOTE — Therapy (Signed)
OUTPATIENT PHYSICAL THERAPY TREATMENT NOTE   Patient Name: Jon Harper MRN: [id number] DOB:October 21, 1955, 67 y.o., male Today's Date: 05/24/2022  END OF SESSION:  PT End of Session - 05/24/22 1100     Visit Number 2    Date for PT Re-Evaluation 07/17/22    Authorization Type [place] [employer]    PT Start Time 1059    PT Stop Time 1140    PT Time Calculation (min) 41 min    Activity Tolerance Patient tolerated treatment well    Behavior During Therapy [facility] for tasks assessed/performed             Past Medical History:  Diagnosis Date   Chronic low back pain    Diabetes mellitus    Hypertension    Neck pain    Neck pain    Skin rash 10/2018   Tooth abscess 10/2018   Past Surgical History:  Procedure Laterality Date   right nephrectomy     Patient Active Problem List   Diagnosis Date Noted   Onychomycosis 03/06/2022   Need for immunization against influenza 03/06/2022   Neuropathy 03/06/2022   Hypertension 10/12/2021   Chest wall pain 06/25/2021   Renal mass, right 07/20/2019   Hemoglobin A1c less than 7.0% 02/04/2019   Proteinuria 02/04/2019   Chronic joint pain 02/04/2019   Overgrown toenails 02/04/2019   Tooth abscess 11/03/2018   Tooth pain 11/03/2018   Skin rash 11/03/2018   Chronic bilateral low back pain with bilateral sciatica 09/30/2018   Prediabetes 09/30/2018   PAC (premature atrial contraction) 07/07/2017   Type 2 diabetes mellitus without complication, without long-term current use of insulin ([place]) 04/16/2017   Abnormal EKG 04/16/2017   Essential hypertension 04/16/2017   Hyperlipidemia 04/16/2017   Hx of renal cell cancer 04/16/2017   Stage 3 chronic kidney disease ([place]) 04/16/2017   History of unilateral nephrectomy 04/16/2017    PCP: Renee Rival, FNP   REFERRING PROVIDER: Ashok Pall, MD   REFERRING DIAG: M54.41 Lumbago with sciatica, right side  Rationale for Evaluation and Treatment: Rehabilitation  THERAPY DIAG:   Other low back pain  Pain in thoracic spine  Muscle weakness (generalized)  Cramp and spasm  ONSET DATE: a few years  SUBJECTIVE:                                                                                                                                                                                           SUBJECTIVE STATEMENT: Patient reports that he has been trying to walk around more, states that he just wants to start feeling better.   PERTINENT HISTORY:  DM,  HTN, left foot fracture 2023, Cancer in R kidney and removed in 2019.   PAIN:  Are you having pain? Yes: NPRS scale: 8/10 Pain location: entire back but lower back is worse.  Pain description: sharp and throbbing Aggravating factors: sitting, standing, walking Relieving factors: heat  PRECAUTIONS: None  WEIGHT BEARING RESTRICTIONS: No  FALLS:  Has patient fallen in last 6 months? Yes. Number of falls 1 when he had a back spasm last week, couldn't get off the floor  LIVING ENVIRONMENT: Lives with: lives with an adult companion Lives in: House/apartment Stairs: No Has following equipment at home: Single point cane and Crutches  OCCUPATION: trying to get disability  PLOF: Independent  PATIENT GOALS: get to where I don't have much pain  NEXT MD VISIT: not sure  OBJECTIVE:   DIAGNOSTIC FINDINGS:  N/A  PATIENT SURVEYS:  Modified Oswestry 38 / 50 = 76.0 %   SCREENING FOR RED FLAGS: Bowel or bladder incontinence: No Spinal tumors: No Cauda equina syndrome: No Compression fracture: No Abdominal aneurysm: No  COGNITION: Overall cognitive status: Within functional limits for tasks assessed     SENSATION: Lower legs and toes intermittently from DM  MUSCLE LENGTH: HS: bil HS tightness Piriformis: R > L tightness Hip Flexors: NT Heelcords: R tight   POSTURE: rounded shoulders, forward head, decreased lumbar lordosis, and flexed trunk   PALPATION: Palpation: TTP at R thoracic and  lumbar paraspinals, bil QL. Increased tissue tension in bil paraspinals Spinal Mobility: NT to due high pain    LUMBAR ROM:   AROM eval  Flexion Hands to shins and pushes up on knees to stand  Extension 50%  Right lateral flexion 15%  Left lateral flexion 50%  Right rotation 25%  Left rotation 25%   (Blank rows = not tested) Pain reported in all motions.    Pt was able to pick something up from the floor when he dropped it.  LOWER EXTREMITY ROM:    WFL for tasks performed today.  LOWER EXTREMITY MMT:    MMT Right eval Left eval  Hip flexion 3+ 3+  Hip extension Able to bridge Able to bridge  Hip abduction 4 then releases 4 in sitting  Hip adduction 4 in sitting 4+ in sitting  Knee flexion    Knee extension 4+ 4+  Ankle dorsiflexion 4+ 5   (Blank rows = not tested)  LUMBAR SPECIAL TESTS:  Straight leg raise test: Positive and Slump test: Negative SLR R  FUNCTIONAL ASSESSMENTS: 05/24/2022: 5 times sit to stand:  80.4 seconds with UE use from chair Timed up and Go (TUG):  27.7 sec without UE  GAIT: Distance walked: 15 Assistive device utilized: None Level of assistance: Complete Independence Comments: antalgic   TODAY'S TREATMENT:                                                                                                                               DATE:  05/24/2022 Nustep level 3 x6 min with PT present to discuss status Education about Dry Needling while patient was on nustep Seated blue pball rollout 5x10 sec Seated hamstring stretch 2x20 sec bilat Sit to/from stand x6 reps TUG Supine posterior pelvic tilt 2x10 Lower trunk rotation x10 Trigger Point Dry-Needling  Treatment instructions: Expect mild to moderate muscle soreness. S/S of pneumothorax if dry needled over a lung field, and to seek immediate medical attention should they occur. Patient verbalized understanding of these instructions and education. Patient Consent Given: Yes Education handout  provided: Yes Muscles treated: bilateral lumbar multifidi Electrical stimulation performed: No Parameters: N/A Treatment response/outcome: Utilized skilled palpation using bony landmarks to locate/identify trigger points.  Able to palpate twitch response and muscle looseness following. Manual Therapy:  soft tissue mobilization to lumbar and thoracic paraspinals utilizing cocoa butter.   PATIENT EDUCATION:  Education details: Issued HEP Person educated: Patient Education method: Explanation, Media planner, and Handouts Education comprehension: verbalized understanding and returned demonstration  HOME EXERCISE PROGRAM: Access Code: [email or web address] URL: [email or web address] Date: 05/24/2022 Prepared by: Shelby Dubin Lurleen Soltero  Exercises - Seated Hamstring Stretch  - 1 x daily - 7 x weekly - 1 sets - 2 reps - 20 sec hold - Sit to Stand  - 1 x daily - 7 x weekly - 2 sets - 5 reps - Supine Posterior Pelvic Tilt  - 1 x daily - 7 x weekly - 2 sets - 10 reps - Supine Lower Trunk Rotation  - 1 x daily - 7 x weekly - 1 sets - 10 reps  ASSESSMENT:  CLINICAL IMPRESSION: Mr Dutton presents to skilled PT reporting 8/10 pain, but does state that he has been trying to up and move around more.  Patient reports a sharp pain in his low back.  Patient able to progress through session and able to obtain baseline functional measurements to assess progress throughout entire certification/episode of care.  Patient educated about dry needling and potential benefits, and he was agreeable to the treatment.  Following dry needling, pt reported that the sharp sensation of pain was gone.  After further manual therapy and soft tissue mobilization, pt reported a further decrease in pain and felt his muscles more relaxed.  Patient reported pain had decreased to 6/10 by completion of session.  Provided pt with handout for HEP and dry needling information.   OBJECTIVE IMPAIRMENTS: Abnormal gait, decreased activity  tolerance, decreased ROM, decreased strength, increased muscle spasms, impaired flexibility, postural dysfunction, and pain.   ACTIVITY LIMITATIONS: carrying, lifting, bending, sitting, standing, sleeping, stairs, and locomotion level  PARTICIPATION LIMITATIONS: cleaning, laundry, driving, shopping, community activity, and occupation  PERSONAL FACTORS: Fitness, Time since onset of injury/illness/exacerbation, and 1-2 comorbidities: DM, HTN  are also affecting patient's functional outcome.   REHAB POTENTIAL: Good  CLINICAL DECISION MAKING: Evolving/moderate complexity  EVALUATION COMPLEXITY: Low   GOALS: Goals reviewed with patient? Yes  SHORT TERM GOALS: Target date: 06/05/2022  Patient will be independent with initial HEP.  Baseline: no HEP Goal status: IN PROGRESS  2.  Patient will report decreased pain by 25% to tolerate increased exercise.  Baseline: 8-9/10 pain Goal status: INITIAL   LONG TERM GOALS: Target date: 07/17/2022   Patient will be independent with advanced/ongoing HEP to improve outcomes and carryover.  Baseline: initial HEP Goal status: INITIAL  2.  Patient will report 75% improvement in low back pain to improve QOL.  Baseline: 8-9/10 Goal status: INITIAL  3.  Patient will demonstrate full/functional pain free lumbar ROM to  perform ADLs.   Baseline: pain in all planes Goal status: INITIAL  4.  Patient will demonstrate improved strength to 4+/5 to normalize functional activities. Baseline: 3+ to 4+/5 Goal status: INITIAL  5.  Patient will report <= 32/50 on lumbar Modified Oswestry to demonstrate improved functional ability.  Baseline: 38 / 50 = 76.0 % Goal status: INITIAL   6.  Patient will tolerate 30 min of (standing/sitting/walking) to perform normal ADLS. Baseline: 15-20 min limit Goal status: INITIAL   PLAN:  PT FREQUENCY: 2x/week  PT DURATION:  12 sessions  PLANNED INTERVENTIONS: Therapeutic exercises, Therapeutic activity,  Neuromuscular re-education, Gait training, Patient/Family education, Self Care, Joint mobilization, Aquatic Therapy, Dry Needling, Electrical stimulation, Spinal mobilization, Cryotherapy, Moist heat, Taping, Traction, Manual therapy, and Re-evaluation.  PLAN FOR NEXT SESSION: assess and progress HEP as indicated, strengthening, core stability, flexibility   Juel Burrow, PT 05/24/2022, 11:55 AM  [facility] [address], [place], [place] [address] Phone # [phone] Fax [phone]

## 2022-05-24 NOTE — Patient Instructions (Signed)

## 2022-05-29 ENCOUNTER — Ambulatory Visit: Payer: Medicare Other | Admitting: Rehabilitative and Restorative Service Providers"

## 2022-05-29 ENCOUNTER — Encounter: Payer: Self-pay | Admitting: Rehabilitative and Restorative Service Providers"

## 2022-05-29 DIAGNOSIS — M546 Pain in thoracic spine: Secondary | ICD-10-CM

## 2022-05-29 DIAGNOSIS — R252 Cramp and spasm: Secondary | ICD-10-CM

## 2022-05-29 DIAGNOSIS — M5459 Other low back pain: Secondary | ICD-10-CM

## 2022-05-29 DIAGNOSIS — M5441 Lumbago with sciatica, right side: Secondary | ICD-10-CM | POA: Diagnosis not present

## 2022-05-29 DIAGNOSIS — M6281 Muscle weakness (generalized): Secondary | ICD-10-CM

## 2022-05-29 NOTE — Therapy (Signed)
OUTPATIENT PHYSICAL THERAPY TREATMENT NOTE   Patient Name: Jon Harper MRN: [id number] DOB:1956-03-07, 67 y.o., male Today's Date: 05/29/2022  END OF SESSION:  PT End of Session - 05/29/22 1105     Visit Number 3    Date for PT Re-Evaluation 07/17/22    Authorization Type [place] [employer]    Authorization - Visit Number 2    Authorization - Number of Visits 3    PT Start Time 1100    PT Stop Time 1140    PT Time Calculation (min) 40 min    Activity Tolerance Patient tolerated treatment well    Behavior During Therapy [facility] for tasks assessed/performed             Past Medical History:  Diagnosis Date   Chronic low back pain    Diabetes mellitus    Hypertension    Neck pain    Neck pain    Skin rash 10/2018   Tooth abscess 10/2018   Past Surgical History:  Procedure Laterality Date   right nephrectomy     Patient Active Problem List   Diagnosis Date Noted   Onychomycosis 03/06/2022   Need for immunization against influenza 03/06/2022   Neuropathy 03/06/2022   Hypertension 10/12/2021   Chest wall pain 06/25/2021   Renal mass, right 07/20/2019   Hemoglobin A1c less than 7.0% 02/04/2019   Proteinuria 02/04/2019   Chronic joint pain 02/04/2019   Overgrown toenails 02/04/2019   Tooth abscess 11/03/2018   Tooth pain 11/03/2018   Skin rash 11/03/2018   Chronic bilateral low back pain with bilateral sciatica 09/30/2018   Prediabetes 09/30/2018   PAC (premature atrial contraction) 07/07/2017   Type 2 diabetes mellitus without complication, without long-term current use of insulin ([place]) 04/16/2017   Abnormal EKG 04/16/2017   Essential hypertension 04/16/2017   Hyperlipidemia 04/16/2017   Hx of renal cell cancer 04/16/2017   Stage 3 chronic kidney disease ([place]) 04/16/2017   History of unilateral nephrectomy 04/16/2017    PCP: Renee Rival, FNP   REFERRING PROVIDER: Ashok Pall, MD   REFERRING DIAG: M54.41 Lumbago with sciatica, right  side  Rationale for Evaluation and Treatment: Rehabilitation  THERAPY DIAG:  Other low back pain  Pain in thoracic spine  Muscle weakness (generalized)  Cramp and spasm  ONSET DATE: a few years  SUBJECTIVE:                                                                                                                                                                                           SUBJECTIVE STATEMENT: Patient reports that he had some decreased pain with  dry needling.  "I definitely feel better".  "I've been doing the exercises that you gave me.  I got some rest last night for the first time in a long time."   PERTINENT HISTORY:  DM, HTN, left foot fracture 2023, Cancer in R kidney and removed in 2019.   PAIN:  Are you having pain? Yes: NPRS scale: 7/10 Pain location: entire back but lower back is worse.  Pain description: sharp and throbbing Aggravating factors: sitting, standing, walking Relieving factors: heat  PRECAUTIONS: None  WEIGHT BEARING RESTRICTIONS: No  FALLS:  Has patient fallen in last 6 months? Yes. Number of falls 1 when he had a back spasm last week, couldn't get off the floor  LIVING ENVIRONMENT: Lives with: lives with an adult companion Lives in: House/apartment Stairs: No Has following equipment at home: Single point cane and Crutches  OCCUPATION: trying to get disability  PLOF: Independent  PATIENT GOALS: get to where I don't have much pain  NEXT MD VISIT: not sure  OBJECTIVE:   DIAGNOSTIC FINDINGS:  N/A  PATIENT SURVEYS:  Modified Oswestry 38 / 50 = 76.0 %   SCREENING FOR RED FLAGS: Bowel or bladder incontinence: No Spinal tumors: No Cauda equina syndrome: No Compression fracture: No Abdominal aneurysm: No  COGNITION: Overall cognitive status: Within functional limits for tasks assessed     SENSATION: Lower legs and toes intermittently from DM  MUSCLE LENGTH: HS: bil HS tightness Piriformis: R > L tightness Hip  Flexors: NT Heelcords: R tight   POSTURE: rounded shoulders, forward head, decreased lumbar lordosis, and flexed trunk   PALPATION: Palpation: TTP at R thoracic and lumbar paraspinals, bil QL. Increased tissue tension in bil paraspinals Spinal Mobility: NT to due high pain    LUMBAR ROM:   AROM eval  Flexion Hands to shins and pushes up on knees to stand  Extension 50%  Right lateral flexion 15%  Left lateral flexion 50%  Right rotation 25%  Left rotation 25%   (Blank rows = not tested) Pain reported in all motions.    Pt was able to pick something up from the floor when he dropped it.  LOWER EXTREMITY ROM:    WFL for tasks performed today.  LOWER EXTREMITY MMT:    MMT Right eval Left eval  Hip flexion 3+ 3+  Hip extension Able to bridge Able to bridge  Hip abduction 4 then releases 4 in sitting  Hip adduction 4 in sitting 4+ in sitting  Knee flexion    Knee extension 4+ 4+  Ankle dorsiflexion 4+ 5   (Blank rows = not tested)  LUMBAR SPECIAL TESTS:  Straight leg raise test: Positive and Slump test: Negative SLR R  FUNCTIONAL ASSESSMENTS: 05/24/2022: 5 times sit to stand:  80.4 seconds with UE use from chair Timed up and Go (TUG):  27.7 sec without UE  GAIT: Distance walked: 15 Assistive device utilized: None Level of assistance: Complete Independence Comments: antalgic   TODAY'S TREATMENT:  DATE:  05/29/2022 Nustep level 3 x6 min with PT present to discuss status Seated blue pball rollout 5x10 sec Seated hamstring stretch 2x20 sec bilat Supine posterior pelvic tilt 2x10 Supine TA contraction pushing through thighs 2x10 Lower trunk rotation x10 Trigger Point Dry-Needling  Treatment instructions: Expect mild to moderate muscle soreness. S/S of pneumothorax if dry needled over a lung field, and to seek immediate medical attention  should they occur. Patient verbalized understanding of these instructions and education. Patient Consent Given: Yes Education handout provided: Yes Muscles treated: bilateral lumbar multifidi Electrical stimulation performed: No Parameters: N/A Treatment response/outcome: Utilized skilled palpation using bony landmarks to locate/identify trigger points.  Able to palpate twitch response and muscle looseness following. Manual Therapy:  soft tissue mobilization to lumbar and thoracic paraspinals utilizing cocoa butter.   DATE:  05/24/2022 Nustep level 3 x6 min with PT present to discuss status Education about Dry Needling while patient was on nustep Seated blue pball rollout 5x10 sec Seated hamstring stretch 2x20 sec bilat Sit to/from stand x6 reps TUG Supine posterior pelvic tilt 2x10 Lower trunk rotation x10 Trigger Point Dry-Needling  Treatment instructions: Expect mild to moderate muscle soreness. S/S of pneumothorax if dry needled over a lung field, and to seek immediate medical attention should they occur. Patient verbalized understanding of these instructions and education. Patient Consent Given: Yes Education handout provided: Yes Muscles treated: bilateral lumbar multifidi Electrical stimulation performed: No Parameters: N/A Treatment response/outcome: Utilized skilled palpation using bony landmarks to locate/identify trigger points.  Able to palpate twitch response and muscle looseness following. Manual Therapy:  soft tissue mobilization to lumbar and thoracic paraspinals utilizing cocoa butter.   PATIENT EDUCATION:  Education details: Issued HEP Person educated: Patient Education method: Explanation, Media planner, and Handouts Education comprehension: verbalized understanding and returned demonstration  HOME EXERCISE PROGRAM: Access Code: [email or web address] URL: [email or web address] Date: 05/24/2022 Prepared by: Shelby Dubin Alven Alverio  Exercises - Seated Hamstring  Stretch  - 1 x daily - 7 x weekly - 1 sets - 2 reps - 20 sec hold - Sit to Stand  - 1 x daily - 7 x weekly - 2 sets - 5 reps - Supine Posterior Pelvic Tilt  - 1 x daily - 7 x weekly - 2 sets - 10 reps - Supine Lower Trunk Rotation  - 1 x daily - 7 x weekly - 1 sets - 10 reps  ASSESSMENT:  CLINICAL IMPRESSION: Mr Galmore presents to skilled PT reporting decreased pain than last session.  Patient able to progress during session and with decreased grimacing noted with transfers.  Patient required less frequent cuing with exercises for maintaining core stability.  Patient requested dry needling again, as he had great benefit last session.  Patient again with great benefit reporting decreased muscle tension following dry needling, then further release of tension and muscle tightness with soft tissue mobilization and manual therapy at completion of session.   OBJECTIVE IMPAIRMENTS: Abnormal gait, decreased activity tolerance, decreased ROM, decreased strength, increased muscle spasms, impaired flexibility, postural dysfunction, and pain.   ACTIVITY LIMITATIONS: carrying, lifting, bending, sitting, standing, sleeping, stairs, and locomotion level  PARTICIPATION LIMITATIONS: cleaning, laundry, driving, shopping, community activity, and occupation  PERSONAL FACTORS: Fitness, Time since onset of injury/illness/exacerbation, and 1-2 comorbidities: DM, HTN  are also affecting patient's functional outcome.   REHAB POTENTIAL: Good  CLINICAL DECISION MAKING: Evolving/moderate complexity  EVALUATION COMPLEXITY: Low   GOALS: Goals reviewed with patient? Yes  SHORT TERM GOALS: Target date: 06/05/2022  Patient will be independent with  initial HEP.  Baseline: no HEP Goal status: MET  2.  Patient will report decreased pain by 25% to tolerate increased exercise.  Baseline: 8-9/10 pain Goal status: IN PROGRESS   LONG TERM GOALS: Target date: 07/17/2022   Patient will be independent with advanced/ongoing  HEP to improve outcomes and carryover.  Baseline: initial HEP Goal status: INITIAL  2.  Patient will report 75% improvement in low back pain to improve QOL.  Baseline: 8-9/10 Goal status: INITIAL  3.  Patient will demonstrate full/functional pain free lumbar ROM to perform ADLs.   Baseline: pain in all planes Goal status: INITIAL  4.  Patient will demonstrate improved strength to 4+/5 to normalize functional activities. Baseline: 3+ to 4+/5 Goal status: INITIAL  5.  Patient will report <= 32/50 on lumbar Modified Oswestry to demonstrate improved functional ability.  Baseline: 38 / 50 = 76.0 % Goal status: INITIAL   6.  Patient will tolerate 30 min of (standing/sitting/walking) to perform normal ADLS. Baseline: 15-20 min limit Goal status: INITIAL   PLAN:  PT FREQUENCY: 2x/week  PT DURATION:  12 sessions  PLANNED INTERVENTIONS: Therapeutic exercises, Therapeutic activity, Neuromuscular re-education, Gait training, Patient/Family education, Self Care, Joint mobilization, Aquatic Therapy, Dry Needling, Electrical stimulation, Spinal mobilization, Cryotherapy, Moist heat, Taping, Traction, Manual therapy, and Re-evaluation.  PLAN FOR NEXT SESSION: assess and progress HEP as indicated, strengthening, core stability, flexibility   Juel Burrow, PT 05/29/2022, 12:21 PM  [facility] [address] [place], [place] [address] Phone # [phone] Fax [phone]

## 2022-05-30 ENCOUNTER — Other Ambulatory Visit: Payer: Self-pay | Admitting: Nurse Practitioner

## 2022-05-30 DIAGNOSIS — G8929 Other chronic pain: Secondary | ICD-10-CM

## 2022-05-30 DIAGNOSIS — E1169 Type 2 diabetes mellitus with other specified complication: Secondary | ICD-10-CM

## 2022-05-31 ENCOUNTER — Encounter: Payer: Medicaid Other | Admitting: Rehabilitative and Restorative Service Providers"

## 2022-05-31 ENCOUNTER — Other Ambulatory Visit: Payer: Self-pay

## 2022-05-31 MED ORDER — ATORVASTATIN CALCIUM 40 MG PO TABS
40.0000 mg | ORAL_TABLET | Freq: Every day | ORAL | 0 refills | Status: DC
  Filled 2022-05-31: qty 90, 90d supply, fill #0

## 2022-05-31 MED ORDER — CYCLOBENZAPRINE HCL 10 MG PO TABS
10.0000 mg | ORAL_TABLET | Freq: Two times a day (BID) | ORAL | 0 refills | Status: DC | PRN
  Filled 2022-05-31: qty 30, 15d supply, fill #0

## 2022-05-31 NOTE — Telephone Encounter (Signed)
Please advise in [place] absence. [place]

## 2022-06-03 ENCOUNTER — Encounter: Payer: Medicaid Other | Admitting: Rehabilitative and Restorative Service Providers"

## 2022-06-03 ENCOUNTER — Other Ambulatory Visit: Payer: Self-pay

## 2022-06-04 ENCOUNTER — Other Ambulatory Visit: Payer: Self-pay

## 2022-06-11 ENCOUNTER — Ambulatory Visit: Payer: Medicare Other | Attending: Neurosurgery | Admitting: Rehabilitative and Restorative Service Providers"

## 2022-06-11 ENCOUNTER — Encounter: Payer: Self-pay | Admitting: Rehabilitative and Restorative Service Providers"

## 2022-06-11 DIAGNOSIS — M546 Pain in thoracic spine: Secondary | ICD-10-CM

## 2022-06-11 DIAGNOSIS — R293 Abnormal posture: Secondary | ICD-10-CM | POA: Insufficient documentation

## 2022-06-11 DIAGNOSIS — M6281 Muscle weakness (generalized): Secondary | ICD-10-CM | POA: Diagnosis present

## 2022-06-11 DIAGNOSIS — M5459 Other low back pain: Secondary | ICD-10-CM | POA: Diagnosis not present

## 2022-06-11 DIAGNOSIS — R262 Difficulty in walking, not elsewhere classified: Secondary | ICD-10-CM | POA: Diagnosis present

## 2022-06-11 DIAGNOSIS — R252 Cramp and spasm: Secondary | ICD-10-CM | POA: Diagnosis present

## 2022-06-11 NOTE — Therapy (Signed)
OUTPATIENT PHYSICAL THERAPY TREATMENT NOTE   Patient Name: Jon Jon MRN: [id number] DOB:[birth year], 67 y.o., male Today's Date: 06/11/2022  END OF SESSION:  PT End of Session - 06/11/22 1406     Visit Number 4    Date for PT Re-Evaluation 07/17/22    Authorization Type [place] [employer]    Authorization Time Period 05/29/2021-06/19/2022    Authorization - Visit Number 3    Authorization - Number of Visits 3    PT Start Time [id number]    PT Stop Time 1445    PT Time Calculation (min) 42 min    Activity Tolerance Patient tolerated treatment well    Behavior During Therapy [facility] for tasks assessed/performed             Past Medical History:  Diagnosis Date   Chronic low back pain    Diabetes mellitus    Hypertension    Neck pain    Neck pain    Skin rash 10/2018   Tooth abscess 10/2018   Past Surgical History:  Procedure Laterality Date   right nephrectomy     Patient Active Problem List   Diagnosis Date Noted   Onychomycosis 03/06/2022   Need for immunization against influenza 03/06/2022   Neuropathy 03/06/2022   Hypertension 10/12/2021   Chest wall pain 06/25/2021   Renal mass, right 07/20/2019   Hemoglobin A1c less than 7.0% 02/04/2019   Proteinuria 02/04/2019   Chronic joint pain 02/04/2019   Overgrown toenails 02/04/2019   Tooth abscess 11/03/2018   Tooth pain 11/03/2018   Skin rash 11/03/2018   Chronic bilateral low back pain with bilateral sciatica 09/30/2018   Prediabetes 09/30/2018   PAC (premature atrial contraction) 07/07/2017   Type 2 diabetes mellitus without complication, without long-term current use of insulin 04/16/2017   Abnormal EKG 04/16/2017   Essential hypertension 04/16/2017   Hyperlipidemia 04/16/2017   Hx of renal cell cancer 04/16/2017   Stage 3 chronic kidney disease 04/16/2017   History of unilateral nephrectomy 04/16/2017    PCP: Jon Rival, FNP   REFERRING PROVIDER: Ashok Pall, MD   REFERRING DIAG: M54.41  Lumbago with sciatica, right side  Rationale for Evaluation and Treatment: Rehabilitation  THERAPY DIAG:  Other low back pain  Pain in thoracic spine  Muscle weakness (generalized)  Cramp and spasm  ONSET DATE: a few years  SUBJECTIVE:                                                                                                                                                                                           SUBJECTIVE STATEMENT: Patient reports that he  had a lot of pain the past couple of days.  Patient reports that he is compliant with HEP.  Patient states that he ran out of his pain medication and needs to contact the MD for a new prescription.  "I can tell my legs are getting stronger."   PERTINENT HISTORY:  DM, HTN, left foot fracture 2023, Cancer in R kidney and removed in 2019.   PAIN:  Are you having pain? Yes: NPRS scale: 6-8/10 Pain location: entire back but lower back is worse.  Pain description: sharp and throbbing Aggravating factors: sitting, standing, walking Relieving factors: heat  PRECAUTIONS: None  WEIGHT BEARING RESTRICTIONS: No  FALLS:  Has patient fallen in last 6 months? Yes. Number of falls 1 when he had a back spasm last week, couldn't get off the floor  LIVING ENVIRONMENT: Lives with: lives with an adult companion Lives in: House/apartment Stairs: No Has following equipment at home: Single point cane and Crutches  OCCUPATION: trying to get disability  PLOF: Independent  PATIENT GOALS: get to where I don't have much pain  NEXT MD VISIT: not sure  OBJECTIVE:   DIAGNOSTIC FINDINGS:  N/A  PATIENT SURVEYS:  Eval:  Modified Oswestry 38 / 50 = 76.0 %   06/11/2022:  Modified Oswestry Low Back Pain Disability Questionnaire: 28 / 50 = 56.0 %  SCREENING FOR RED FLAGS: Bowel or bladder incontinence: No Spinal tumors: No Cauda equina syndrome: No Compression fracture: No Abdominal aneurysm: No  COGNITION: Overall cognitive  status: Within functional limits for tasks assessed     SENSATION: Lower legs and toes intermittently from DM  MUSCLE LENGTH: HS: bil HS tightness Piriformis: R > L tightness Hip Flexors: NT Heelcords: R tight   POSTURE: rounded shoulders, forward head, decreased lumbar lordosis, and flexed trunk   PALPATION: Palpation: TTP at R thoracic and lumbar paraspinals, bil QL. Increased tissue tension in bil paraspinals Spinal Mobility: NT to due high pain    LUMBAR ROM:   AROM eval  Flexion Hands to shins and pushes up on knees to stand  Extension 50%  Right lateral flexion 15%  Left lateral flexion 50%  Right rotation 25%  Left rotation 25%   (Blank rows = not tested) Pain reported in all motions.    Pt was able to pick something up from the floor when he dropped it.  LOWER EXTREMITY ROM:    WFL for tasks performed today.  LOWER EXTREMITY MMT:    MMT Right eval Left eval  Hip flexion 3+ 3+  Hip extension Able to bridge Able to bridge  Hip abduction 4 then releases 4 in sitting  Hip adduction 4 in sitting 4+ in sitting  Knee flexion    Knee extension 4+ 4+  Ankle dorsiflexion 4+ 5   (Blank rows = not tested)  LUMBAR SPECIAL TESTS:  Straight leg raise test: Positive and Slump test: Negative SLR R  FUNCTIONAL ASSESSMENTS: 05/24/2022: 5 times sit to stand:  80.4 seconds with UE use from chair Timed up and Go (TUG):  27.7 sec without UE  GAIT: Distance walked: 15 Assistive device utilized: None Level of assistance: Complete Independence Comments: antalgic   TODAY'S TREATMENT:  DATE:  06/11/2022 Nustep level 3 x6 min with PT present to discuss status Oswestry Applied heating pad to back during supine activities Supine hamstring stretch with strap 2x20 sec bilat Lower trunk rotation 2x10 Supine posterior pelvic tilt 2x10 Supine TA  contraction pushing through thighs 2x10 Trigger Point Dry-Needling  Treatment instructions: Expect mild to moderate muscle soreness. S/S of pneumothorax if dry needled over a lung field, and to seek immediate medical attention should they occur. Patient verbalized understanding of these instructions and education. Patient Consent Given: Yes Education handout provided: Yes Muscles treated: bilateral lumbar multifidi Electrical stimulation performed: No Parameters: N/A Treatment response/outcome: Utilized skilled palpation using bony landmarks to locate/identify trigger points.  Able to palpate twitch response and muscle looseness following. Manual Therapy:  soft tissue mobilization to lumbar and thoracic paraspinals utilizing cocoa butter.   DATE:  05/29/2022 Nustep level 3 x6 min with PT present to discuss status Seated blue pball rollout 5x10 sec Seated hamstring stretch 2x20 sec bilat Supine posterior pelvic tilt 2x10 Supine TA contraction pushing through thighs 2x10 Lower trunk rotation x10 Trigger Point Dry-Needling  Treatment instructions: Expect mild to moderate muscle soreness. S/S of pneumothorax if dry needled over a lung field, and to seek immediate medical attention should they occur. Patient verbalized understanding of these instructions and education. Patient Consent Given: Yes Education handout provided: Yes Muscles treated: bilateral lumbar multifidi Electrical stimulation performed: No Parameters: N/A Treatment response/outcome: Utilized skilled palpation using bony landmarks to locate/identify trigger points.  Able to palpate twitch response and muscle looseness following. Manual Therapy:  soft tissue mobilization to lumbar and thoracic paraspinals utilizing cocoa butter.   DATE:  05/24/2022 Nustep level 3 x6 min with PT present to discuss status Education about Dry Needling while patient was on nustep Seated blue pball rollout 5x10 sec Seated hamstring stretch 2x20  sec bilat Sit to/from stand x6 reps TUG Supine posterior pelvic tilt 2x10 Lower trunk rotation x10 Trigger Point Dry-Needling  Treatment instructions: Expect mild to moderate muscle soreness. S/S of pneumothorax if dry needled over a lung field, and to seek immediate medical attention should they occur. Patient verbalized understanding of these instructions and education. Patient Consent Given: Yes Education handout provided: Yes Muscles treated: bilateral lumbar multifidi Electrical stimulation performed: No Parameters: N/A Treatment response/outcome: Utilized skilled palpation using bony landmarks to locate/identify trigger points.  Able to palpate twitch response and muscle looseness following. Manual Therapy:  soft tissue mobilization to lumbar and thoracic paraspinals utilizing cocoa butter.   PATIENT EDUCATION:  Education details: Issued HEP Person educated: Patient Education method: Explanation, Media planner, and Handouts Education comprehension: verbalized understanding and returned demonstration  HOME EXERCISE PROGRAM: Access Code: [email or web address] URL: [email or web address] Date: 05/24/2022 Prepared by: Shelby Dubin Henri Guedes  Exercises - Seated Hamstring Stretch  - 1 x daily - 7 x weekly - 1 sets - 2 reps - 20 sec hold - Sit to Stand  - 1 x daily - 7 x weekly - 2 sets - 5 reps - Supine Posterior Pelvic Tilt  - 1 x daily - 7 x weekly - 2 sets - 10 reps - Supine Lower Trunk Rotation  - 1 x daily - 7 x weekly - 1 sets - 10 reps  ASSESSMENT:  CLINICAL IMPRESSION: Mr Cerasoli presents to skilled PT reporting continued benefits from dry needling.  Patient states that he is having some increased pain today, but overall, he is feeling better.  Patient reports overall feeling approximately 40% better since initial evaluation.  Patient continues to  have increased pain that limits his overall progress.  Patient has had some relief with manual therapy.  Patient continues to progress in  sessions with improved score in modified oswestry.  Patient reports an approximate 40% decrease in overall pain since starting PT.  However, patient continues to have pain and is unable to go on community outings as much as he was able to prior to the injury.  Patient would benefit from continued skilled PT to progress towards goal related activities and decreased overall pain.   OBJECTIVE IMPAIRMENTS: Abnormal gait, decreased activity tolerance, decreased ROM, decreased strength, increased muscle spasms, impaired flexibility, postural dysfunction, and pain.   ACTIVITY LIMITATIONS: carrying, lifting, bending, sitting, standing, sleeping, stairs, and locomotion level  PARTICIPATION LIMITATIONS: cleaning, laundry, driving, shopping, community activity, and occupation  PERSONAL FACTORS: Fitness, Time since onset of injury/illness/exacerbation, and 1-2 comorbidities: DM, HTN  are also affecting patient's functional outcome.   REHAB POTENTIAL: Good  CLINICAL DECISION MAKING: Evolving/moderate complexity  EVALUATION COMPLEXITY: Low   GOALS: Goals reviewed with patient? Yes  SHORT TERM GOALS: Target date: 06/05/2022  Patient will be independent with initial HEP.  Baseline: no HEP Goal status: MET  2.  Patient will report decreased pain by 25% to tolerate increased exercise.  Baseline: 8-9/10 pain Goal status: MET   LONG TERM GOALS: Target date: 07/17/2022   Patient will be independent with advanced/ongoing HEP to improve outcomes and carryover.  Baseline: initial HEP Goal status: INITIAL  2.  Patient will report 75% improvement in low back pain to improve QOL.  Baseline: 8-9/10 Goal status: IN PROGRESS  3.  Patient will demonstrate full/functional pain free lumbar ROM to perform ADLs.   Baseline: pain in all planes Goal status: INITIAL  4.  Patient will demonstrate improved strength to 4+/5 to normalize functional activities. Baseline: 3+ to 4+/5 Goal status: INITIAL  5.   Patient will report <= 32/50 on lumbar Modified Oswestry to demonstrate improved functional ability.  Baseline: 38 / 50 = 76.0 % Goal status: MET   6.  Patient will tolerate 30 min of (standing/sitting/walking) to perform normal ADLS. Baseline: 15-20 min limit Goal status: INITIAL   PLAN:  PT FREQUENCY: 2x/week  PT DURATION:  12 sessions  PLANNED INTERVENTIONS: Therapeutic exercises, Therapeutic activity, Neuromuscular re-education, Gait training, Patient/Family education, Self Care, Joint mobilization, Aquatic Therapy, Dry Needling, Electrical stimulation, Spinal mobilization, Cryotherapy, Moist heat, Taping, Traction, Manual therapy, and Re-evaluation.  PLAN FOR NEXT SESSION: assess and progress HEP as indicated, strengthening, core stability, flexibility, manual therapy/dry needling as indicated   Juel Burrow, PT 06/11/2022, 2:53 PM  [facility] [address] [place], [place] [address] Phone # [phone] Fax [phone]

## 2022-06-13 ENCOUNTER — Emergency Department (HOSPITAL_BASED_OUTPATIENT_CLINIC_OR_DEPARTMENT_OTHER): Payer: Medicare Other | Admitting: Radiology

## 2022-06-13 ENCOUNTER — Emergency Department (HOSPITAL_BASED_OUTPATIENT_CLINIC_OR_DEPARTMENT_OTHER)
Admission: EM | Admit: 2022-06-13 | Discharge: 2022-06-13 | Disposition: A | Discharge status: Home / Self Care | Payer: Medicare Other | Attending: Emergency Medicine | Admitting: Emergency Medicine

## 2022-06-13 ENCOUNTER — Other Ambulatory Visit: Payer: Self-pay

## 2022-06-13 ENCOUNTER — Ambulatory Visit: Payer: Medicare Other

## 2022-06-13 ENCOUNTER — Encounter (HOSPITAL_BASED_OUTPATIENT_CLINIC_OR_DEPARTMENT_OTHER): Payer: Self-pay | Admitting: Emergency Medicine

## 2022-06-13 DIAGNOSIS — E119 Type 2 diabetes mellitus without complications: Secondary | ICD-10-CM | POA: Insufficient documentation

## 2022-06-13 DIAGNOSIS — R293 Abnormal posture: Secondary | ICD-10-CM

## 2022-06-13 DIAGNOSIS — Y990 Civilian activity done for income or pay: Secondary | ICD-10-CM | POA: Diagnosis not present

## 2022-06-13 DIAGNOSIS — X500XXA Overexertion from strenuous movement or load, initial encounter: Secondary | ICD-10-CM | POA: Insufficient documentation

## 2022-06-13 DIAGNOSIS — Z7984 Long term (current) use of oral hypoglycemic drugs: Secondary | ICD-10-CM | POA: Diagnosis not present

## 2022-06-13 DIAGNOSIS — M5441 Lumbago with sciatica, right side: Secondary | ICD-10-CM | POA: Insufficient documentation

## 2022-06-13 DIAGNOSIS — M545 Low back pain, unspecified: Secondary | ICD-10-CM | POA: Diagnosis present

## 2022-06-13 DIAGNOSIS — I10 Essential (primary) hypertension: Secondary | ICD-10-CM | POA: Insufficient documentation

## 2022-06-13 DIAGNOSIS — R252 Cramp and spasm: Secondary | ICD-10-CM

## 2022-06-13 DIAGNOSIS — M5459 Other low back pain: Secondary | ICD-10-CM | POA: Diagnosis not present

## 2022-06-13 DIAGNOSIS — Z79899 Other long term (current) drug therapy: Secondary | ICD-10-CM | POA: Insufficient documentation

## 2022-06-13 DIAGNOSIS — R262 Difficulty in walking, not elsewhere classified: Secondary | ICD-10-CM

## 2022-06-13 DIAGNOSIS — M546 Pain in thoracic spine: Secondary | ICD-10-CM

## 2022-06-13 DIAGNOSIS — M6281 Muscle weakness (generalized): Secondary | ICD-10-CM

## 2022-06-13 MED ORDER — HYDROCODONE-ACETAMINOPHEN 5-325 MG PO TABS: 1.0000 | ORAL_TABLET | Freq: Four times a day (QID) | ORAL | 0 refills | Status: DC | PRN

## 2022-06-13 MED ORDER — CYCLOBENZAPRINE HCL 10 MG PO TABS: 10.0000 mg | ORAL_TABLET | Freq: Two times a day (BID) | ORAL | 0 refills | Status: DC | PRN

## 2022-06-13 MED ORDER — HYDROMORPHONE HCL 1 MG/ML IJ SOLN
1.0000 mg | Freq: Once | INTRAMUSCULAR | Status: AC
  Administered 2022-06-13: 1 mg via INTRAMUSCULAR
  Filled 2022-06-13: qty 1

## 2022-06-13 MED ORDER — NAPROXEN 500 MG PO TABS: 500.0000 mg | ORAL_TABLET | Freq: Two times a day (BID) | ORAL | 0 refills | Status: DC

## 2022-06-13 NOTE — Therapy (Signed)
OUTPATIENT PHYSICAL THERAPY TREATMENT NOTE   Patient Name: Jon Harper MRN: [id number] DOB:21-Sep-1955, 67 y.o., male Today's Date: 06/13/2022  END OF SESSION:  PT End of Session - 06/13/22 1639     Visit Number 5    Number of Visits 13    Date for PT Re-Evaluation 07/17/22    Authorization Type [employer]    Authorization Time Period 05/29/2021-06/19/2022    PT Start Time 1620    PT Stop Time 1700    PT Time Calculation (min) 40 min    Activity Tolerance Patient tolerated treatment well    Behavior During Therapy [facility] for tasks assessed/performed             Past Medical History:  Diagnosis Date   Chronic low back pain    Diabetes mellitus    Hypertension    Neck pain    Neck pain    Skin rash 10/2018   Tooth abscess 10/2018   Past Surgical History:  Procedure Laterality Date   right nephrectomy     Patient Active Problem List   Diagnosis Date Noted   Onychomycosis 03/06/2022   Need for immunization against influenza 03/06/2022   Neuropathy 03/06/2022   Hypertension 10/12/2021   Chest wall pain 06/25/2021   Renal mass, right 07/20/2019   Hemoglobin A1c less than 7.0% 02/04/2019   Proteinuria 02/04/2019   Chronic joint pain 02/04/2019   Overgrown toenails 02/04/2019   Tooth abscess 11/03/2018   Tooth pain 11/03/2018   Skin rash 11/03/2018   Chronic bilateral low back pain with bilateral sciatica 09/30/2018   Prediabetes 09/30/2018   PAC (premature atrial contraction) 07/07/2017   Type 2 diabetes mellitus without complication, without long-term current use of insulin 04/16/2017   Abnormal EKG 04/16/2017   Essential hypertension 04/16/2017   Hyperlipidemia 04/16/2017   Hx of renal cell cancer 04/16/2017   Stage 3 chronic kidney disease 04/16/2017   History of unilateral nephrectomy 04/16/2017    PCP: Renee Rival, FNP   REFERRING PROVIDER: Ashok Pall, MD   REFERRING DIAG: M54.41 Lumbago with sciatica, right side  Rationale for  Evaluation and Treatment: Rehabilitation  THERAPY DIAG:  Other low back pain  Pain in thoracic spine  Muscle weakness (generalized)  Cramp and spasm  Difficulty in walking, not elsewhere classified  Abnormal posture  ONSET DATE: a few years  SUBJECTIVE:                                                                                                                                                                                           SUBJECTIVE STATEMENT: Patient reports that he  is in a lot of pain and is out of pain medication.  He states that the doctor has not given him any more pain medication.  He admits that he smokes marijuana to ease the pain.     PERTINENT HISTORY:  DM, HTN, left foot fracture 2023, Cancer in R kidney and removed in 2019.   PAIN:  Are you having pain? Yes: NPRS scale: 6-8/10 Pain location: entire back but lower back is worse.  Pain description: sharp and throbbing Aggravating factors: sitting, standing, walking Relieving factors: heat  PRECAUTIONS: None  WEIGHT BEARING RESTRICTIONS: No  FALLS:  Has patient fallen in last 6 months? Yes. Number of falls 1 when he had a back spasm last week, couldn't get off the floor  LIVING ENVIRONMENT: Lives with: lives with an adult companion Lives in: House/apartment Stairs: No Has following equipment at home: Single point cane and Crutches  OCCUPATION: trying to get disability  PLOF: Independent  PATIENT GOALS: get to where I don't have much pain  NEXT MD VISIT: not sure  OBJECTIVE:   DIAGNOSTIC FINDINGS:  N/A  PATIENT SURVEYS:  Eval:  Modified Oswestry 38 / 50 = 76.0 %   06/11/2022:  Modified Oswestry Low Back Pain Disability Questionnaire: 28 / 50 = 56.0 %  SCREENING FOR RED FLAGS: Bowel or bladder incontinence: No Spinal tumors: No Cauda equina syndrome: No Compression fracture: No Abdominal aneurysm: No  COGNITION: Overall cognitive status: Within functional limits for tasks  assessed     SENSATION: Lower legs and toes intermittently from DM  MUSCLE LENGTH: HS: bil HS tightness Piriformis: R > L tightness Hip Flexors: NT Heelcords: R tight   POSTURE: rounded shoulders, forward head, decreased lumbar lordosis, and flexed trunk   PALPATION: Palpation: TTP at R thoracic and lumbar paraspinals, bil QL. Increased tissue tension in bil paraspinals Spinal Mobility: NT to due high pain    LUMBAR ROM:   AROM eval  Flexion Hands to shins and pushes up on knees to stand  Extension 50%  Right lateral flexion 15%  Left lateral flexion 50%  Right rotation 25%  Left rotation 25%   (Blank rows = not tested) Pain reported in all motions.    Pt was able to pick something up from the floor when he dropped it.  LOWER EXTREMITY ROM:    WFL for tasks performed today.  LOWER EXTREMITY MMT:    MMT Right eval Left eval  Hip flexion 3+ 3+  Hip extension Able to bridge Able to bridge  Hip abduction 4 then releases 4 in sitting  Hip adduction 4 in sitting 4+ in sitting  Knee flexion    Knee extension 4+ 4+  Ankle dorsiflexion 4+ 5   (Blank rows = not tested)  LUMBAR SPECIAL TESTS:  Straight leg raise test: Positive and Slump test: Negative SLR R  FUNCTIONAL ASSESSMENTS: 05/24/2022: 5 times sit to stand:  80.4 seconds with UE use from chair Timed up and Go (TUG):  27.7 sec without UE  GAIT: Distance walked: 15 Assistive device utilized: None Level of assistance: Complete Independence Comments: antalgic   TODAY'S TREATMENT:  DATE:  06/13/2022 Nustep level 3 x6 min with PT present to discuss status Discussion and education on pain control options: suggested possibly aquatic therapy and pain management Explained to patient that his focus should be on flexibility and core strength along with controlling his symptoms.  Encouraged  patient to find some level of walking or exercise that he does consistently to take his focus off of his pain.   Trigger Point Dry-Needling  Treatment instructions: Expect mild to moderate muscle soreness. S/S of pneumothorax if dry needled over a lung field, and to seek immediate medical attention should they occur. Patient verbalized understanding of these instructions and education. Patient Consent Given: Yes Education handout provided: Yes Muscles treated: bilateral lumbar multifidi Electrical stimulation performed: No Parameters: N/A Treatment response/outcome: Utilized skilled palpation using bony landmarks to locate/identify trigger points.  Able to palpate twitch response and muscle looseness following. Manual Therapy:  soft tissue mobilization to lumbar and thoracic paraspinals utilizing cocoa butter.   DATE:  06/11/2022 Nustep level 3 x6 min with PT present to discuss status Oswestry Applied heating pad to back during supine activities Supine hamstring stretch with strap 2x20 sec bilat Lower trunk rotation 2x10 Supine posterior pelvic tilt 2x10 Supine TA contraction pushing through thighs 2x10 Trigger Point Dry-Needling  Treatment instructions: Expect mild to moderate muscle soreness. S/S of pneumothorax if dry needled over a lung field, and to seek immediate medical attention should they occur. Patient verbalized understanding of these instructions and education. Patient Consent Given: Yes Education handout provided: Yes Muscles treated: bilateral lumbar multifidi Electrical stimulation performed: No Parameters: N/A Treatment response/outcome: Utilized skilled palpation using bony landmarks to locate/identify trigger points.  Able to palpate twitch response and muscle looseness following. Manual Therapy:  soft tissue mobilization to lumbar and thoracic paraspinals utilizing cocoa butter.   DATE:  05/29/2022 Nustep level 3 x6 min with PT present to discuss status Seated blue  pball rollout 5x10 sec Seated hamstring stretch 2x20 sec bilat Supine posterior pelvic tilt 2x10 Supine TA contraction pushing through thighs 2x10 Lower trunk rotation x10 Trigger Point Dry-Needling  Treatment instructions: Expect mild to moderate muscle soreness. S/S of pneumothorax if dry needled over a lung field, and to seek immediate medical attention should they occur. Patient verbalized understanding of these instructions and education. Patient Consent Given: Yes Education handout provided: Yes Muscles treated: bilateral lumbar multifidi Electrical stimulation performed: No Parameters: N/A Treatment response/outcome: Utilized skilled palpation using bony landmarks to locate/identify trigger points.  Able to palpate twitch response and muscle looseness following. Manual Therapy:  soft tissue mobilization to lumbar and thoracic paraspinals utilizing cocoa butter.   PATIENT EDUCATION:  Education details: Issued HEP Person educated: Patient Education method: Explanation, Media planner, and Handouts Education comprehension: verbalized understanding and returned demonstration  HOME EXERCISE PROGRAM: Access Code: [email or web address] URL: [email or web address] Date: 05/24/2022 Prepared by: Shelby Dubin Menke  Exercises - Seated Hamstring Stretch  - 1 x daily - 7 x weekly - 1 sets - 2 reps - 20 sec hold - Sit to Stand  - 1 x daily - 7 x weekly - 2 sets - 5 reps - Supine Posterior Pelvic Tilt  - 1 x daily - 7 x weekly - 2 sets - 10 reps - Supine Lower Trunk Rotation  - 1 x daily - 7 x weekly - 1 sets - 10 reps  ASSESSMENT:  CLINICAL IMPRESSION: Mr Rozzell continues to be very pain focused.  We had a lengthy discussion today about his options.  We encouraged him to  shift his focus to a walking or other routine exercise program along with his HEP.  Warned against being sedentary.  He admits marijuana use to control his pain. He may benefit from shift to aquatic therapy and possible pain  management specialist consult.  Patient would benefit from continued skilled PT to progress towards goal related activities and decreased overall pain.   OBJECTIVE IMPAIRMENTS: Abnormal gait, decreased activity tolerance, decreased ROM, decreased strength, increased muscle spasms, impaired flexibility, postural dysfunction, and pain.   ACTIVITY LIMITATIONS: carrying, lifting, bending, sitting, standing, sleeping, stairs, and locomotion level  PARTICIPATION LIMITATIONS: cleaning, laundry, driving, shopping, community activity, and occupation  PERSONAL FACTORS: Fitness, Time since onset of injury/illness/exacerbation, and 1-2 comorbidities: DM, HTN  are also affecting patient's functional outcome.   REHAB POTENTIAL: Good  CLINICAL DECISION MAKING: Evolving/moderate complexity  EVALUATION COMPLEXITY: Low   GOALS: Goals reviewed with patient? Yes  SHORT TERM GOALS: Target date: 06/05/2022  Patient will be independent with initial HEP.  Baseline: no HEP Goal status: MET  2.  Patient will report decreased pain by 25% to tolerate increased exercise.  Baseline: 8-9/10 pain Goal status: MET   LONG TERM GOALS: Target date: 07/17/2022   Patient will be independent with advanced/ongoing HEP to improve outcomes and carryover.  Baseline: initial HEP Goal status: INITIAL  2.  Patient will report 75% improvement in low back pain to improve QOL.  Baseline: 8-9/10 Goal status: IN PROGRESS  3.  Patient will demonstrate full/functional pain free lumbar ROM to perform ADLs.   Baseline: pain in all planes Goal status: INITIAL  4.  Patient will demonstrate improved strength to 4+/5 to normalize functional activities. Baseline: 3+ to 4+/5 Goal status: INITIAL  5.  Patient will report <= 32/50 on lumbar Modified Oswestry to demonstrate improved functional ability.  Baseline: 38 / 50 = 76.0 % Goal status: MET   6.  Patient will tolerate 30 min of (standing/sitting/walking) to perform normal  ADLS. Baseline: 15-20 min limit Goal status: INITIAL   PLAN:  PT FREQUENCY: 2x/week  PT DURATION:  12 sessions  PLANNED INTERVENTIONS: Therapeutic exercises, Therapeutic activity, Neuromuscular re-education, Gait training, Patient/Family education, Self Care, Joint mobilization, Aquatic Therapy, Dry Needling, Electrical stimulation, Spinal mobilization, Cryotherapy, Moist heat, Taping, Traction, Manual therapy, and Re-evaluation.  PLAN FOR NEXT SESSION: assess and progress HEP as indicated, strengthening, core stability, flexibility, manual therapy/dry needling as indicated   Deni Lefever B. Emony Dormer, PT 06/13/22 9:45 PM  [facility] [address], [address] [place], [place] [address] Phone # [phone] Fax [phone]

## 2022-06-13 NOTE — ED Triage Notes (Signed)
Pt arrives pov, c/o lower back pain and RT lower flank since 2/11 after injury at work while lifting buckets of water.

## 2022-06-13 NOTE — Discharge Instructions (Signed)
Take the medications as needed for pain.  Follow-up with Dr. Christella Noa forFurther treatment

## 2022-06-13 NOTE — ED Provider Notes (Signed)
[facility] Provider Note   CSN: [id number] Arrival date & time: 06/13/22  1717     History  Chief Complaint  Patient presents with   Back Pain    Jon Harper is a 67 y.o. male.   Back Pain  Denies history of hypertension diabetes.  Patient presents to the ED for evaluation of back pain.  Patient states he has been having pain since February after lifting.  Patient has been having sharp pain in his lower back that goes down to his right leg.  Movement and palpation increases the pain.  He denies any numbness or weakness.  No bowel or bladder incontinence.  Patient states he has not had any x-rays or imaging.  He would like to get x-rays today.  The pain is more severe.  He states he does not have any prescriptions for pain. Outpatient records indicate that patient was referred to physical therapy by Dr. Christella Noa.  Notes in the record indicate first visit was on March 15.  Notes indicate patient's had back pain for few years Home Medications Prior to Admission medications   Medication Sig Start Date End Date Taking? Authorizing Provider  cyclobenzaprine (FLEXERIL) 10 MG tablet Take 1 tablet (10 mg total) by mouth 2 (two) times daily as needed for muscle spasms. 06/13/22  Yes Dorie Rank, MD  HYDROcodone-acetaminophen (NORCO/VICODIN) 5-325 MG tablet Take 1 tablet by mouth every 6 (six) hours as needed. 06/13/22  Yes Dorie Rank, MD  naproxen (NAPROSYN) 500 MG tablet Take 1 tablet (500 mg total) by mouth 2 (two) times daily with a meal. As needed for pain 06/13/22  Yes Dorie Rank, MD  acetaminophen (TYLENOL 8 HOUR) 650 MG CR tablet Take 1 tablet (650 mg total) by mouth every 8 (eight) hours as needed for pain. 12/05/21   Fenton Foy, NP  atorvastatin (LIPITOR) 40 MG tablet Take 1 tablet (40 mg total) by mouth daily. 05/31/22   Paseda, Dewaine Conger, FNP  DULoxetine (CYMBALTA) 30 MG capsule Take 1 capsule (30 mg total) by mouth daily for 7 days, THEN 2  capsules (60 mg total) daily. Patient not taking: Reported on 03/06/2022 12/05/21 12/12/22  Fenton Foy, NP  empagliflozin (JARDIANCE) 10 MG TABS tablet Take 1 tablet (10 mg total) by mouth daily before breakfast. 03/06/22   Paseda, Dewaine Conger, FNP  gabapentin (NEURONTIN) 300 MG capsule Take 1 capsule (300 mg total) by mouth 2 (two) times daily. 05/01/22   Paseda, Dewaine Conger, FNP  glipiZIDE (GLUCOTROL) 10 MG tablet TAKE 1 TABLET (10 MG TOTAL) BY MOUTH 2 (TWO) TIMES DAILY BEFORE A MEAL. 03/06/22   Paseda, Dewaine Conger, FNP  hydrochlorothiazide (HYDRODIURIL) 12.5 MG tablet Take 1 tablet (12.5 mg total) by mouth daily. 05/09/22   Renee Rival, FNP  hydrocortisone cream 0.5 % Apply 1 application topically 2 (two) times daily. Patient not taking: Reported on 09/06/2021 10/26/18   Azzie Glatter, FNP  losartan (COZAAR) 50 MG tablet Take 2 tablets (100 mg total) by mouth daily. 03/06/22   Renee Rival, FNP  meclizine (ANTIVERT) 25 MG tablet Take 1 tablet (25 mg total) by mouth 3 (three) times daily as needed for dizziness. Patient not taking: Reported on 03/06/2022 09/06/21   Fenton Foy, NP  metFORMIN (GLUCOPHAGE) 500 MG tablet Take 1 tablet (500 mg total) by mouth 2 (two) times daily with a meal. Patient not taking: Reported on 03/06/2022 12/05/21 12/05/22  Fenton Foy, NP  metoprolol tartrate (LOPRESSOR) 25 MG tablet Take 1 tablet (25 mg total) by mouth in the morning, at noon, and at bedtime. 03/06/22 07/04/22  Renee Rival, FNP  oxyCODONE (ROXICODONE) 5 MG immediate release tablet Take 0.5-1 tablets (2.5-5 mg total) by mouth every 6 (six) hours as needed for severe pain. Patient not taking: Reported on 12/05/2021 10/15/20   Margarita Mail, PA-C      Allergies    Patient has no known allergies.    Review of Systems   Review of Systems  Musculoskeletal:  Positive for back pain.    Physical Exam Updated Vital Signs BP 129/87   Pulse 75   Temp 98.6 F (37 C)  (Oral)   Resp 18   Wt 113.4 kg   SpO2 97%   BMI 33.91 kg/m  Physical Exam Vitals and nursing note reviewed.  Constitutional:      General: He is not in acute distress.    Appearance: He is well-developed.  HENT:     Head: Normocephalic and atraumatic.     Right Ear: External ear normal.     Left Ear: External ear normal.  Eyes:     General: No scleral icterus.       Right eye: No discharge.        Left eye: No discharge.     Conjunctiva/sclera: Conjunctivae normal.  Neck:     Trachea: No tracheal deviation.  Cardiovascular:     Rate and Rhythm: Normal rate.  Pulmonary:     Effort: Pulmonary effort is normal. No respiratory distress.     Breath sounds: No stridor.  Abdominal:     General: There is no distension.  Musculoskeletal:        General: No swelling or deformity.     Cervical back: Neck supple.     Lumbar back: Tenderness present. No deformity.  Skin:    General: Skin is warm and dry.     Findings: No rash.  Neurological:     Mental Status: He is alert. Mental status is at baseline.     Cranial Nerves: No dysarthria or facial asymmetry.     Motor: No seizure activity.     Comments: Normal strength and sensation lower extremities     ED Results / Procedures / Treatments   Labs (all labs ordered are listed, but only abnormal results are displayed) Labs Reviewed - No data to display  EKG None  Radiology DG Lumbar Spine Complete  Result Date: 06/13/2022 CLINICAL DATA:  Back pain.  Injury at work February EXAM: [facility] 5 VIEW COMPARISON:  X-ray 09/20/2020 FINDINGS: Five lumbar-type vertebral bodies. Preserved vertebral body height, disc height and alignment. No listhesis. Scattered endplate osteophytes. Preserved bone mineralization. No listhesis or spondylolysis. Mild lower lumbar facet degenerative change IMPRESSION: Mild degenerative changes. Electronically Signed   By: Jill Side M.D.   On: 06/13/2022 18:48    Procedures Procedures     Medications Ordered in ED Medications  HYDROmorphone (DILAUDID) injection 1 mg (1 mg Intramuscular Given 06/13/22 1754)    ED Course/ Medical Decision Making/ A&P Clinical Course as of 06/13/22 1910  Thu Jun 13, 2022  1742 PDMP reviewed.  Prescription for gabapentin noted back in March 22 of this year [JK]  1854 X-rays show mild degenerative changes [JK]    Clinical Course User Index [JK] Dorie Rank, MD  Medical Decision Making Problems Addressed: Acute right-sided low back pain with right-sided sciatica: acute illness or injury that poses a threat to life or bodily functions  Amount and/or Complexity of Data Reviewed Radiology: ordered and independent interpretation performed.  Risk Prescription drug management. Parenteral controlled substances.    Patient presented to ED for evaluation of persistent back pain.  Patient does have symptoms suggestive of sciatica.  He does not have any acute neurologic deficits including weakness numbness or incontinence.Finding to suggestInfection or cauda equina.  Patient was given a dose of pain medications here in the ED.  X-rays did not show any acute abnormalities.  I did review his outpatient records and the patient has been seeing Dr. Cyndy Freeze neurosurgery.  Patient states he supposed to have an MRI at some point.  Recommended follow-up with Dr. Christella Noa.    Evaluation and diagnostic testing in the emergency department does not suggest an emergent condition requiring admission or immediate intervention beyond what has been performed at this time.  The patient is safe for discharge and has been instructed to return immediately for worsening symptoms, change in symptoms or any other concerns.\        Final Clinical Impression(s) / ED Diagnoses Final diagnoses:  Acute right-sided low back pain with right-sided sciatica    Rx / DC Orders ED Discharge Orders          Ordered    HYDROcodone-acetaminophen  (NORCO/VICODIN) 5-325 MG tablet  Every 6 hours PRN        06/13/22 1909    naproxen (NAPROSYN) 500 MG tablet  2 times daily with meals        06/13/22 1909    cyclobenzaprine (FLEXERIL) 10 MG tablet  2 times daily PRN        06/13/22 1909              Dorie Rank, MD 06/13/22 1910

## 2022-06-14 ENCOUNTER — Telehealth: Payer: Self-pay

## 2022-06-14 NOTE — Transitions of Care (Post Inpatient/ED Visit) (Signed)
   06/14/2022  Name: Jon Harper MRN: [id number] DOB: Jan 29, 1956  Today's TOC FU Call Status: Today's TOC FU Call Status:: Successful TOC FU Call Competed TOC FU Call Complete Date: 06/14/22  Transition Care Management Follow-up Telephone Call Date of Discharge: 06/13/22 Discharge Facility: [facility] ([facility]) Type of Discharge: Emergency Department Reason for ED Visit: Orthopedic Conditions Orthopedic/Injury Diagnosis: Sprain or Strain How have you been since you were released from the hospital?: Same Any questions or concerns?: No  Items Reviewed: Did you receive and understand the discharge instructions provided?: Yes Medications obtained and verified?: Yes (Medications Reviewed) Any new allergies since your discharge?: No Dietary orders reviewed?: NA Do you have support at home?: Yes People in Home: friend(s)  Home Care and Equipment/Supplies: Were Home Health Services Ordered?: NA Any new equipment or medical supplies ordered?: NA  Functional Questionnaire: Do you need assistance with bathing/showering or dressing?: No Do you need assistance with meal preparation?: No Do you need assistance with eating?: No Do you have difficulty maintaining continence: No Do you need assistance with getting out of bed/getting out of a chair/moving?: No Do you have difficulty managing or taking your medications?: No  Follow up appointments reviewed: PCP Follow-up appointment confirmed?: Yes Date of PCP follow-up appointment?: 07/01/22 Follow-up Provider: [facility] Follow-up appointment confirmed?: NA Do you need transportation to your follow-up appointment?: Yes Transportation Need Intervention Addressed By:: Transportation Arranged Do you understand care options if your condition(s) worsen?: Yes-patient verbalized understanding    SIGNATURE Renelda Loma RMA

## 2022-06-14 NOTE — Telephone Encounter (Signed)
T is requesting to have a handicap placard . Pease advise if you will give him one before his 07/01/22 appt. KH

## 2022-06-14 NOTE — Telephone Encounter (Signed)
Form ready just need questions answers and to signed. KH

## 2022-06-17 ENCOUNTER — Telehealth: Payer: Self-pay

## 2022-06-17 NOTE — Telephone Encounter (Signed)
Left a message. Gh

## 2022-06-18 ENCOUNTER — Ambulatory Visit: Payer: Medicare Other | Admitting: Rehabilitative and Restorative Service Providers"

## 2022-06-18 ENCOUNTER — Encounter: Payer: Self-pay | Admitting: Rehabilitative and Restorative Service Providers"

## 2022-06-18 DIAGNOSIS — M6281 Muscle weakness (generalized): Secondary | ICD-10-CM

## 2022-06-18 DIAGNOSIS — M546 Pain in thoracic spine: Secondary | ICD-10-CM

## 2022-06-18 DIAGNOSIS — R252 Cramp and spasm: Secondary | ICD-10-CM

## 2022-06-18 DIAGNOSIS — M5459 Other low back pain: Secondary | ICD-10-CM | POA: Diagnosis not present

## 2022-06-18 NOTE — Therapy (Signed)
OUTPATIENT PHYSICAL THERAPY TREATMENT NOTE   Patient Name: Jon Harper MRN: [id number] DOB:06/27/1955, 67 y.o., male Today's Date: 06/18/2022  END OF SESSION:  PT End of Session - 06/18/22 1409     Visit Number 6    Date for PT Re-Evaluation 07/17/22    Authorization Type CCME    Authorization Time Period 05/29/2021-06/19/2022    Authorization - Visit Number 3    Authorization - Number of Visits 3    PT Start Time 1406    PT Stop Time 1445    PT Time Calculation (min) 39 min    Activity Tolerance Patient tolerated treatment well    Behavior During Therapy [facility] for tasks assessed/performed             Past Medical History:  Diagnosis Date   Chronic low back pain    Diabetes mellitus    Hypertension    Neck pain    Neck pain    Skin rash 10/2018   Tooth abscess 10/2018   Past Surgical History:  Procedure Laterality Date   right nephrectomy     Patient Active Problem List   Diagnosis Date Noted   Onychomycosis 03/06/2022   Need for immunization against influenza 03/06/2022   Neuropathy 03/06/2022   Hypertension 10/12/2021   Chest wall pain 06/25/2021   Renal mass, right 07/20/2019   Hemoglobin A1c less than 7.0% 02/04/2019   Proteinuria 02/04/2019   Chronic joint pain 02/04/2019   Overgrown toenails 02/04/2019   Tooth abscess 11/03/2018   Tooth pain 11/03/2018   Skin rash 11/03/2018   Chronic bilateral low back pain with bilateral sciatica 09/30/2018   Prediabetes 09/30/2018   PAC (premature atrial contraction) 07/07/2017   Type 2 diabetes mellitus without complication, without long-term current use of insulin 04/16/2017   Abnormal EKG 04/16/2017   Essential hypertension 04/16/2017   Hyperlipidemia 04/16/2017   Hx of renal cell cancer 04/16/2017   Stage 3 chronic kidney disease 04/16/2017   History of unilateral nephrectomy 04/16/2017    PCP: Donell Beers, FNP   REFERRING PROVIDER: Coletta Memos, MD   REFERRING DIAG: M54.41 Lumbago with  sciatica, right side  Rationale for Evaluation and Treatment: Rehabilitation  THERAPY DIAG:  Other low back pain  Pain in thoracic spine  Muscle weakness (generalized)  Cramp and spasm  ONSET DATE: a few years  SUBJECTIVE:                                                                                                                                                                                           SUBJECTIVE STATEMENT: Patient reports that he started noting  increased pain later in the day following PT session, states that the pain was not related to his PT session.  States that he has been having pain since getting injured on a job previously, which is why he was referred to PT.  States that overall, he has started feeling better overall since working with PT.    PERTINENT HISTORY:  DM, HTN, left foot fracture 2023, Cancer in R kidney and removed in 2019.   PAIN:  Are you having pain? Yes: NPRS scale: 7/10 Pain location: entire back but lower back is worse.  Pain description: sharp and throbbing Aggravating factors: sitting, standing, walking Relieving factors: heat  PRECAUTIONS: None  WEIGHT BEARING RESTRICTIONS: No  FALLS:  Has patient fallen in last 6 months? Yes. Number of falls 1 when he had a back spasm last week, couldn't get off the floor  LIVING ENVIRONMENT: Lives with: lives with an adult companion Lives in: House/apartment Stairs: No Has following equipment at home: Single point cane and Crutches  OCCUPATION: trying to get disability  PLOF: Independent  PATIENT GOALS: get to where I don't have much pain  NEXT MD VISIT: not sure  OBJECTIVE:   DIAGNOSTIC FINDINGS:  N/A  PATIENT SURVEYS:  Eval:  Modified Oswestry 38 / 50 = 76.0 %   06/11/2022:  Modified Oswestry Low Back Pain Disability Questionnaire: 28 / 50 = 56.0 %  SCREENING FOR RED FLAGS: Bowel or bladder incontinence: No Spinal tumors: No Cauda equina syndrome: No Compression  fracture: No Abdominal aneurysm: No  COGNITION: Overall cognitive status: Within functional limits for tasks assessed     SENSATION: Lower legs and toes intermittently from DM  MUSCLE LENGTH: HS: bil HS tightness Piriformis: R > L tightness Hip Flexors: NT Heelcords: R tight   POSTURE: rounded shoulders, forward head, decreased lumbar lordosis, and flexed trunk   PALPATION: Palpation: TTP at R thoracic and lumbar paraspinals, bil QL. Increased tissue tension in bil paraspinals Spinal Mobility: NT to due high pain    LUMBAR ROM:   AROM eval  Flexion Hands to shins and pushes up on knees to stand  Extension 50%  Right lateral flexion 15%  Left lateral flexion 50%  Right rotation 25%  Left rotation 25%   (Blank rows = not tested) Pain reported in all motions.    Pt was able to pick something up from the floor when he dropped it.  LOWER EXTREMITY ROM:    WFL for tasks performed today.  LOWER EXTREMITY MMT:    MMT Right eval Left eval  Hip flexion 3+ 3+  Hip extension Able to bridge Able to bridge  Hip abduction 4 then releases 4 in sitting  Hip adduction 4 in sitting 4+ in sitting  Knee flexion    Knee extension 4+ 4+  Ankle dorsiflexion 4+ 5   (Blank rows = not tested)  LUMBAR SPECIAL TESTS:  Straight leg raise test: Positive and Slump test: Negative SLR R  FUNCTIONAL ASSESSMENTS: 05/24/2022: 5 times sit to stand:  80.4 seconds with UE use from chair Timed up and Go (TUG):  27.7 sec without UE  GAIT: Distance walked: 15 Assistive device utilized: None Level of assistance: Complete Independence Comments: antalgic   TODAY'S TREATMENT:  DATE:  06/18/2022 Nustep level 3 x8 min with PT present to discuss status Seated TA contraction by pushing into thighs 2x10 Seated hip adduction with ball 2x10 Seated clamshells with yellow loop  2x10 Seated rows with red tband 2x10 Seated shoulder horizontal abduction with red tband 2x10 Standing hamstring stretch at stairs 2x20 sec bilat Seated green pball rollout 5x10 sec Seated cat/cow x10   DATE:  06/13/2022 Nustep level 3 x6 min with PT present to discuss status Discussion and education on pain control options: suggested possibly aquatic therapy and pain management Explained to patient that his focus should be on flexibility and core strength along with controlling his symptoms.  Encouraged patient to find some level of walking or exercise that he does consistently to take his focus off of his pain.   Trigger Point Dry-Needling  Treatment instructions: Expect mild to moderate muscle soreness. S/S of pneumothorax if dry needled over a lung field, and to seek immediate medical attention should they occur. Patient verbalized understanding of these instructions and education. Patient Consent Given: Yes Education handout provided: Yes Muscles treated: bilateral lumbar multifidi Electrical stimulation performed: No Parameters: N/A Treatment response/outcome: Utilized skilled palpation using bony landmarks to locate/identify trigger points.  Able to palpate twitch response and muscle looseness following. Manual Therapy:  soft tissue mobilization to lumbar and thoracic paraspinals utilizing cocoa butter.   DATE:  06/11/2022 Nustep level 3 x6 min with PT present to discuss status Oswestry Applied heating pad to back during supine activities Supine hamstring stretch with strap 2x20 sec bilat Lower trunk rotation 2x10 Supine posterior pelvic tilt 2x10 Supine TA contraction pushing through thighs 2x10 Trigger Point Dry-Needling  Treatment instructions: Expect mild to moderate muscle soreness. S/S of pneumothorax if dry needled over a lung field, and to seek immediate medical attention should they occur. Patient verbalized understanding of these instructions and education. Patient  Consent Given: Yes Education handout provided: Yes Muscles treated: bilateral lumbar multifidi Electrical stimulation performed: No Parameters: N/A Treatment response/outcome: Utilized skilled palpation using bony landmarks to locate/identify trigger points.  Able to palpate twitch response and muscle looseness following. Manual Therapy:  soft tissue mobilization to lumbar and thoracic paraspinals utilizing cocoa butter.     PATIENT EDUCATION:  Education details: Issued HEP Person educated: Patient Education method: Explanation, Facilities manager, and Handouts Education comprehension: verbalized understanding and returned demonstration  HOME EXERCISE PROGRAM: Access Code: [email or web address] URL: [email or web address] Date: 05/24/2022 Prepared by: Clydie Braun Alyze Lauf  Exercises - Seated Hamstring Stretch  - 1 x daily - 7 x weekly - 1 sets - 2 reps - 20 sec hold - Sit to Stand  - 1 x daily - 7 x weekly - 2 sets - 5 reps - Supine Posterior Pelvic Tilt  - 1 x daily - 7 x weekly - 2 sets - 10 reps - Supine Lower Trunk Rotation  - 1 x daily - 7 x weekly - 1 sets - 10 reps  ASSESSMENT:  CLINICAL IMPRESSION: Mr Doom continues to present with increased pain reporting that he is having a lot of tightness, but states overall feeling better since starting PT.  Patient does state that he has been trying to walk some since last session.  Patient reports pain decreases to 5-6/10 by end of session.  Patient able to progress with strengthening and flexibility exercises during session today with noted improved mobility throughout session.  Patient continues to remain limited secondary to his reports of pain, but he was able to progress during session today.  Will progress HEP next session if patient tolerated exercises during session today well.   OBJECTIVE IMPAIRMENTS: Abnormal gait, decreased activity tolerance, decreased ROM, decreased strength, increased muscle spasms, impaired flexibility, postural  dysfunction, and pain.   ACTIVITY LIMITATIONS: carrying, lifting, bending, sitting, standing, sleeping, stairs, and locomotion level  PARTICIPATION LIMITATIONS: cleaning, laundry, driving, shopping, community activity, and occupation  PERSONAL FACTORS: Fitness, Time since onset of injury/illness/exacerbation, and 1-2 comorbidities: DM, HTN  are also affecting patient's functional outcome.   REHAB POTENTIAL: Good  CLINICAL DECISION MAKING: Evolving/moderate complexity  EVALUATION COMPLEXITY: Low   GOALS: Goals reviewed with patient? Yes  SHORT TERM GOALS: Target date: 06/05/2022  Patient will be independent with initial HEP.  Baseline: no HEP Goal status: MET  2.  Patient will report decreased pain by 25% to tolerate increased exercise.  Baseline: 8-9/10 pain Goal status: MET   LONG TERM GOALS: Target date: 07/17/2022   Patient will be independent with advanced/ongoing HEP to improve outcomes and carryover.  Baseline: initial HEP Goal status: INITIAL  2.  Patient will report 75% improvement in low back pain to improve QOL.  Baseline: 8-9/10 Goal status: IN PROGRESS  3.  Patient will demonstrate full/functional pain free lumbar ROM to perform ADLs.   Baseline: pain in all planes Goal status: INITIAL  4.  Patient will demonstrate improved strength to 4+/5 to normalize functional activities. Baseline: 3+ to 4+/5 Goal status: INITIAL  5.  Patient will report <= 32/50 on lumbar Modified Oswestry to demonstrate improved functional ability.  Baseline: 38 / 50 = 76.0 % Goal status: MET   6.  Patient will tolerate 30 min of (standing/sitting/walking) to perform normal ADLS. Baseline: 15-20 min limit Goal status: INITIAL   PLAN:  PT FREQUENCY: 2x/week  PT DURATION:  12 sessions  PLANNED INTERVENTIONS: Therapeutic exercises, Therapeutic activity, Neuromuscular re-education, Gait training, Patient/Family education, Self Care, Joint mobilization, Aquatic Therapy, Dry  Needling, Electrical stimulation, Spinal mobilization, Cryotherapy, Moist heat, Taping, Traction, Manual therapy, and Re-evaluation.  PLAN FOR NEXT SESSION: assess and progress HEP as indicated, strengthening, core stability, flexibility, manual therapy/dry needling as indicated   Reather Laurence, PT 06/18/22 3:51 PM   [facility] [address], [address] [place], [place] [address] Phone # [phone] Fax [phone]

## 2022-06-20 ENCOUNTER — Ambulatory Visit: Payer: Medicare Other

## 2022-06-25 ENCOUNTER — Ambulatory Visit: Payer: Medicare Other | Admitting: Rehabilitative and Restorative Service Providers"

## 2022-06-25 ENCOUNTER — Encounter: Payer: Self-pay | Admitting: Rehabilitative and Restorative Service Providers"

## 2022-06-25 DIAGNOSIS — R252 Cramp and spasm: Secondary | ICD-10-CM

## 2022-06-25 DIAGNOSIS — M6281 Muscle weakness (generalized): Secondary | ICD-10-CM

## 2022-06-25 DIAGNOSIS — M546 Pain in thoracic spine: Secondary | ICD-10-CM

## 2022-06-25 DIAGNOSIS — M5459 Other low back pain: Secondary | ICD-10-CM | POA: Diagnosis not present

## 2022-06-25 NOTE — Therapy (Addendum)
OUTPATIENT PHYSICAL THERAPY TREATMENT NOTE AND LATE ENTRY DISCHARGE SUMMARY   Patient Name: Jon Harper. MRN: [id number] DOB:08-18-1955, 67 y.o., male Today's Date: 06/25/2022  END OF SESSION:  PT End of Session - 06/25/22 1405     Visit Number 7    Date for PT Re-Evaluation 07/17/22    Authorization Type Medicare A/Medicaid    Progress Note Due on Visit 10    PT Start Time 1401    PT Stop Time 1440    PT Time Calculation (min) 39 min    Activity Tolerance Patient tolerated treatment well    Behavior During Therapy [facility] for tasks assessed/performed             Past Medical History:  Diagnosis Date   Chronic low back pain    Diabetes mellitus    Hypertension    Neck pain    Neck pain    Skin rash 10/2018   Tooth abscess 10/2018   Past Surgical History:  Procedure Laterality Date   right nephrectomy     Patient Active Problem List   Diagnosis Date Noted   Onychomycosis 03/06/2022   Need for immunization against influenza 03/06/2022   Neuropathy 03/06/2022   Hypertension 10/12/2021   Chest wall pain 06/25/2021   Renal mass, right 07/20/2019   Hemoglobin A1c less than 7.0% 02/04/2019   Proteinuria 02/04/2019   Chronic joint pain 02/04/2019   Overgrown toenails 02/04/2019   Tooth abscess 11/03/2018   Tooth pain 11/03/2018   Skin rash 11/03/2018   Chronic bilateral low back pain with bilateral sciatica 09/30/2018   Prediabetes 09/30/2018   PAC (premature atrial contraction) 07/07/2017   Type 2 diabetes mellitus without complication, without long-term current use of insulin 04/16/2017   Abnormal EKG 04/16/2017   Essential hypertension 04/16/2017   Hyperlipidemia 04/16/2017   Hx of renal cell cancer 04/16/2017   Stage 3 chronic kidney disease 04/16/2017   History of unilateral nephrectomy 04/16/2017    PCP: Donell Beers, FNP   REFERRING PROVIDER: Coletta Memos, MD   REFERRING DIAG: M54.41 Lumbago with sciatica, right side  Rationale for  Evaluation and Treatment: Rehabilitation  THERAPY DIAG:  Other low back pain  Pain in thoracic spine  Muscle weakness (generalized)  Cramp and spasm  ONSET DATE: a few years  SUBJECTIVE:                                                                                                                                                                                           SUBJECTIVE STATEMENT: Patient reports that he is feeling "a lot better".  Pt continues to have  constant pain.   PERTINENT HISTORY:  DM, HTN, left foot fracture 2023, Cancer in R kidney and removed in 2019.   PAIN:  Are you having pain? Yes: NPRS scale: 5-6/10 Pain location: entire back but lower back is worse.  Pain description: sharp and throbbing Aggravating factors: sitting, standing, walking Relieving factors: heat  PRECAUTIONS: None  WEIGHT BEARING RESTRICTIONS: No  FALLS:  Has patient fallen in last 6 months? Yes. Number of falls 1 when he had a back spasm last week, couldn't get off the floor  LIVING ENVIRONMENT: Lives with: lives with an adult companion Lives in: House/apartment Stairs: No Has following equipment at home: Single point cane and Crutches  OCCUPATION: trying to get disability  PLOF: Independent  PATIENT GOALS: get to where I don't have much pain  NEXT MD VISIT: not sure  OBJECTIVE:   DIAGNOSTIC FINDINGS:  N/A  PATIENT SURVEYS:  Eval:  Modified Oswestry 38 / 50 = 76.0 %   06/11/2022:  Modified Oswestry Low Back Pain Disability Questionnaire: 28 / 50 = 56.0 %  SCREENING FOR RED FLAGS: Bowel or bladder incontinence: No Spinal tumors: No Cauda equina syndrome: No Compression fracture: No Abdominal aneurysm: No  COGNITION: Overall cognitive status: Within functional limits for tasks assessed     SENSATION: Lower legs and toes intermittently from DM  MUSCLE LENGTH: HS: bil HS tightness Piriformis: R > L tightness Hip Flexors: NT Heelcords: R tight   POSTURE:  rounded shoulders, forward head, decreased lumbar lordosis, and flexed trunk   PALPATION: Palpation: TTP at R thoracic and lumbar paraspinals, bil QL. Increased tissue tension in bil paraspinals Spinal Mobility: NT to due high pain    LUMBAR ROM:   AROM eval  Flexion Hands to shins and pushes up on knees to stand  Extension 50%  Right lateral flexion 15%  Left lateral flexion 50%  Right rotation 25%  Left rotation 25%   (Blank rows = not tested) Pain reported in all motions.    Pt was able to pick something up from the floor when he dropped it.  LOWER EXTREMITY ROM:    WFL for tasks performed today.  LOWER EXTREMITY MMT:    MMT Right eval Left eval  Hip flexion 3+ 3+  Hip extension Able to bridge Able to bridge  Hip abduction 4 then releases 4 in sitting  Hip adduction 4 in sitting 4+ in sitting  Knee flexion    Knee extension 4+ 4+  Ankle dorsiflexion 4+ 5   (Blank rows = not tested)  LUMBAR SPECIAL TESTS:  Straight leg raise test: Positive and Slump test: Negative SLR R  FUNCTIONAL ASSESSMENTS: 05/24/2022: 5 times sit to stand:  80.4 seconds with UE use from chair Timed up and Go (TUG):  27.7 sec without UE  GAIT: Distance walked: 15 Assistive device utilized: None Level of assistance: Complete Independence Comments: antalgic   TODAY'S TREATMENT:  DATE:  06/25/2022 Nustep level 5 x7 min with PT present to discuss status Seated TA contraction by pushing into thighs 2x10 Seated hip adduction with ball 2x10 Seated clamshells with yellow loop 2x10 Seated shoulder ER and horizontal abduction with red tband 2x10 each Seated side flex with arm overhead 2x5 Seated cat/cow x10 Standing rows and shoulder extension with red tband 2x10 each Standing "L" stretch at barre 2x20 sec Standing hamstring stretch at stairs 2x20 sec bilat Hip flexion  stretch at stairs with overhead reach x10 bilat   DATE:  06/18/2022 Nustep level 3 x8 min with PT present to discuss status Seated TA contraction by pushing into thighs 2x10 Seated hip adduction with ball 2x10 Seated clamshells with yellow loop 2x10 Seated rows with red tband 2x10 Seated shoulder horizontal abduction with red tband 2x10 Standing hamstring stretch at stairs 2x20 sec bilat Seated green pball rollout 5x10 sec Seated cat/cow x10   DATE:  06/13/2022 Nustep level 3 x6 min with PT present to discuss status Discussion and education on pain control options: suggested possibly aquatic therapy and pain management Explained to patient that his focus should be on flexibility and core strength along with controlling his symptoms.  Encouraged patient to find some level of walking or exercise that he does consistently to take his focus off of his pain.   Trigger Point Dry-Needling  Treatment instructions: Expect mild to moderate muscle soreness. S/S of pneumothorax if dry needled over a lung field, and to seek immediate medical attention should they occur. Patient verbalized understanding of these instructions and education. Patient Consent Given: Yes Education handout provided: Yes Muscles treated: bilateral lumbar multifidi Electrical stimulation performed: No Parameters: N/A Treatment response/outcome: Utilized skilled palpation using bony landmarks to locate/identify trigger points.  Able to palpate twitch response and muscle looseness following. Manual Therapy:  soft tissue mobilization to lumbar and thoracic paraspinals utilizing cocoa butter.      PATIENT EDUCATION:  Education details: Issued HEP Person educated: Patient Education method: Explanation, Facilities manager, and Handouts Education comprehension: verbalized understanding and returned demonstration  HOME EXERCISE PROGRAM: Access Code: [email or web address] URL: [email or web address] Date: 06/25/2022 Prepared by:  Clydie Braun Jabori Henegar  Exercises - Supine Posterior Pelvic Tilt  - 1 x daily - 7 x weekly - 2 sets - 10 reps - Supine Lower Trunk Rotation  - 1 x daily - 7 x weekly - 1 sets - 10 reps - Seated Hamstring Stretch  - 1 x daily - 7 x weekly - 1 sets - 2 reps - 20 sec hold - Sit to Stand  - 1 x daily - 7 x weekly - 2 sets - 5 reps - Seated Long Arc Quad  - 1 x daily - 7 x weekly - 2 sets - 10 reps - Seated March  - 1 x daily - 7 x weekly - 2 sets - 10 reps - Seated Cat Cow  - 1 x daily - 7 x weekly - 1 sets - 10 reps - Seated Alternating Side Stretch with Arm Overhead  - 1 x daily - 7 x weekly - 2 sets - 5 reps - Standing Shoulder Horizontal Abduction with Resistance  - 1 x daily - 7 x weekly - 2 sets - 10 reps - Shoulder External Rotation and Scapular Retraction with Resistance  - 1 x daily - 7 x weekly - 2 sets - 10 reps - Standing 'L' Stretch at Counter  - 1 x daily - 7 x weekly - 1 sets - 2 reps -  20 sec hold  ASSESSMENT:  CLINICAL IMPRESSION: Mr Plouff presents to skilled PT reporting that he is feeling at least 50% better since starting skilled PT and reports that he has been walking more at home.  Patient able to progress with continued strengthening and flexibility during session.  Able to provide patient with updated HEP and red tband for use at home.  Patient able to safely demonstrate new exercises and reports feeling muscle loosening with the stretches.  Patient required cuing during session for improved upright posture.  Patient reported feeling better by end of session and hopes that he will soon be able to sleep with less pain.   OBJECTIVE IMPAIRMENTS: Abnormal gait, decreased activity tolerance, decreased ROM, decreased strength, increased muscle spasms, impaired flexibility, postural dysfunction, and pain.   ACTIVITY LIMITATIONS: carrying, lifting, bending, sitting, standing, sleeping, stairs, and locomotion level  PARTICIPATION LIMITATIONS: cleaning, laundry, driving, shopping, community  activity, and occupation  PERSONAL FACTORS: Fitness, Time since onset of injury/illness/exacerbation, and 1-2 comorbidities: DM, HTN  are also affecting patient's functional outcome.   REHAB POTENTIAL: Good  CLINICAL DECISION MAKING: Evolving/moderate complexity  EVALUATION COMPLEXITY: Low   GOALS: Goals reviewed with patient? Yes  SHORT TERM GOALS: Target date: 06/05/2022  Patient will be independent with initial HEP.  Baseline: no HEP Goal status: MET  2.  Patient will report decreased pain by 25% to tolerate increased exercise.  Baseline: 8-9/10 pain Goal status: MET   LONG TERM GOALS: Target date: 07/17/2022   Patient will be independent with advanced/ongoing HEP to improve outcomes and carryover.  Baseline: initial HEP Goal status: IN PROGRESS  2.  Patient will report 75% improvement in low back pain to improve QOL.  Baseline: 8-9/10 Goal status: IN PROGRESS  3.  Patient will demonstrate full/functional pain free lumbar ROM to perform ADLs.   Baseline: pain in all planes Goal status: IN PROGRESS  4.  Patient will demonstrate improved strength to 4+/5 to normalize functional activities. Baseline: 3+ to 4+/5 Goal status: INITIAL  5.  Patient will report <= 32/50 on lumbar Modified Oswestry to demonstrate improved functional ability.  Baseline: 38 / 50 = 76.0 % Goal status: MET   6.  Patient will tolerate 30 min of (standing/sitting/walking) to perform normal ADLS. Baseline: 15-20 min limit Goal status: IN PROGRESS   PLAN:  PT FREQUENCY: 2x/week  PT DURATION:  12 sessions  PLANNED INTERVENTIONS: Therapeutic exercises, Therapeutic activity, Neuromuscular re-education, Gait training, Patient/Family education, Self Care, Joint mobilization, Aquatic Therapy, Dry Needling, Electrical stimulation, Spinal mobilization, Cryotherapy, Moist heat, Taping, Traction, Manual therapy, and Re-evaluation.  PLAN FOR NEXT SESSION: assess and progress HEP as indicated,  strengthening, core stability, flexibility, manual therapy/dry needling as indicated   Reather Laurence, PT 06/25/22 2:52 PM   [facility] [address] [place], [place] [address] Phone # [phone] Fax [phone]    PHYSICAL THERAPY DISCHARGE SUMMARY  As of 04/10/2023, patient has not returned for further visits and discharged at this time.  Patient agrees to discharge. Patient goals were not met. Patient is being discharged due to not returning since the last visit.  Clydie Braun Nethaniel Mattie, PT, DPT 04/10/23, 7:33 AM

## 2022-06-27 ENCOUNTER — Ambulatory Visit: Payer: Medicare Other

## 2022-06-28 ENCOUNTER — Ambulatory Visit: Payer: Medicare Other | Admitting: Physical Therapy

## 2022-07-01 ENCOUNTER — Ambulatory Visit: Payer: Medicaid Other | Admitting: Nurse Practitioner

## 2022-07-01 ENCOUNTER — Inpatient Hospital Stay: Payer: Medicaid Other | Admitting: Nurse Practitioner

## 2022-07-02 ENCOUNTER — Ambulatory Visit: Payer: Medicare Other

## 2022-07-04 ENCOUNTER — Ambulatory Visit: Payer: Medicare Other | Admitting: Rehabilitative and Restorative Service Providers"

## 2022-07-16 ENCOUNTER — Other Ambulatory Visit: Payer: Self-pay | Admitting: Neurosurgery

## 2022-07-16 DIAGNOSIS — M5441 Lumbago with sciatica, right side: Secondary | ICD-10-CM

## 2022-07-23 ENCOUNTER — Other Ambulatory Visit: Payer: Self-pay | Admitting: Neurosurgery

## 2022-07-23 DIAGNOSIS — M5441 Lumbago with sciatica, right side: Secondary | ICD-10-CM

## 2022-07-25 ENCOUNTER — Other Ambulatory Visit: Payer: Self-pay

## 2022-07-26 ENCOUNTER — Other Ambulatory Visit: Payer: Self-pay | Admitting: Nurse Practitioner

## 2022-07-26 ENCOUNTER — Other Ambulatory Visit: Payer: Self-pay

## 2022-07-26 DIAGNOSIS — I10 Essential (primary) hypertension: Secondary | ICD-10-CM

## 2022-07-26 MED ORDER — METOPROLOL TARTRATE 25 MG PO TABS
25.0000 mg | ORAL_TABLET | Freq: Three times a day (TID) | ORAL | 0 refills | Status: DC
Start: 2022-07-26 — End: 2022-07-30
  Filled 2022-07-26: qty 30, 10d supply, fill #0

## 2022-07-30 ENCOUNTER — Ambulatory Visit (INDEPENDENT_AMBULATORY_CARE_PROVIDER_SITE_OTHER): Payer: Medicare Other | Admitting: Nurse Practitioner

## 2022-07-30 ENCOUNTER — Encounter: Payer: Self-pay | Admitting: Nurse Practitioner

## 2022-07-30 ENCOUNTER — Other Ambulatory Visit: Payer: Self-pay

## 2022-07-30 VITALS — BP 140/92 | HR 98 | Ht 72.0 in | Wt 252.6 lb

## 2022-07-30 DIAGNOSIS — N1831 Chronic kidney disease, stage 3a: Secondary | ICD-10-CM | POA: Diagnosis not present

## 2022-07-30 DIAGNOSIS — M79602 Pain in left arm: Secondary | ICD-10-CM | POA: Diagnosis not present

## 2022-07-30 DIAGNOSIS — E785 Hyperlipidemia, unspecified: Secondary | ICD-10-CM | POA: Diagnosis not present

## 2022-07-30 DIAGNOSIS — M5442 Lumbago with sciatica, left side: Secondary | ICD-10-CM | POA: Diagnosis not present

## 2022-07-30 DIAGNOSIS — E1169 Type 2 diabetes mellitus with other specified complication: Secondary | ICD-10-CM

## 2022-07-30 DIAGNOSIS — M5441 Lumbago with sciatica, right side: Secondary | ICD-10-CM

## 2022-07-30 DIAGNOSIS — G8929 Other chronic pain: Secondary | ICD-10-CM | POA: Diagnosis not present

## 2022-07-30 DIAGNOSIS — I10 Essential (primary) hypertension: Secondary | ICD-10-CM

## 2022-07-30 DIAGNOSIS — I491 Atrial premature depolarization: Secondary | ICD-10-CM

## 2022-07-30 DIAGNOSIS — F32A Depression, unspecified: Secondary | ICD-10-CM

## 2022-07-30 LAB — POCT GLYCOSYLATED HEMOGLOBIN (HGB A1C): Hemoglobin A1C: 9.8 % — AB (ref 4.0–5.6)

## 2022-07-30 MED ORDER — EMPAGLIFLOZIN 25 MG PO TABS
25.0000 mg | ORAL_TABLET | Freq: Every day | ORAL | 1 refills | Status: DC
Start: 2022-07-30 — End: 2023-03-10
  Filled 2022-07-30 (×2): qty 90, 90d supply, fill #0
  Filled 2022-11-07 – 2022-11-18 (×2): qty 90, 90d supply, fill #1

## 2022-07-30 MED ORDER — LOSARTAN POTASSIUM 50 MG PO TABS
100.0000 mg | ORAL_TABLET | Freq: Every day | ORAL | 3 refills | Status: DC
Start: 2022-07-30 — End: 2023-01-27
  Filled 2022-07-30 – 2022-12-08 (×2): qty 180, 90d supply, fill #0

## 2022-07-30 MED ORDER — ONETOUCH VERIO FLEX SYSTEM W/DEVICE KIT
1.0000 | PACK | Freq: Once | 0 refills | Status: AC
Start: 2022-07-30 — End: 2022-07-31
  Filled 2022-07-30: qty 1, fill #0

## 2022-07-30 MED ORDER — BLOOD PRESSURE KIT
1.0000 | PACK | 0 refills | Status: AC
Start: 2022-07-30 — End: ?
  Filled 2022-07-30: qty 1, fill #0

## 2022-07-30 MED ORDER — GLUCOSE BLOOD VI STRP
ORAL_STRIP | 12 refills | Status: AC
Start: 2022-07-30 — End: ?
  Filled 2022-07-30: qty 200, 100d supply, fill #0

## 2022-07-30 MED ORDER — DULOXETINE HCL 30 MG PO CPEP
ORAL_CAPSULE | ORAL | 11 refills | Status: DC
Start: 2022-07-30 — End: 2023-01-25
  Filled 2022-07-30 – 2022-08-08 (×2): qty 60, 33d supply, fill #0
  Filled 2022-11-07 – 2022-11-18 (×2): qty 60, 30d supply, fill #1

## 2022-07-30 MED ORDER — ONETOUCH DELICA PLUS LANCET33G MISC
1.0000 | Freq: Two times a day (BID) | 11 refills | Status: AC
Start: 2022-07-30 — End: ?
  Filled 2022-07-30: qty 200, fill #0

## 2022-07-30 MED ORDER — HYDROCHLOROTHIAZIDE 12.5 MG PO TABS
12.5000 mg | ORAL_TABLET | Freq: Every day | ORAL | 0 refills | Status: DC
Start: 2022-07-30 — End: 2022-10-08
  Filled 2022-07-30 – 2022-08-08 (×2): qty 90, 90d supply, fill #0

## 2022-07-30 MED ORDER — METOPROLOL TARTRATE 25 MG PO TABS
25.0000 mg | ORAL_TABLET | Freq: Two times a day (BID) | ORAL | 1 refills | Status: DC
Start: 2022-07-30 — End: 2023-03-10
  Filled 2022-07-30 – 2022-08-08 (×3): qty 180, 90d supply, fill #0
  Filled 2022-11-07 – 2022-11-18 (×2): qty 180, 90d supply, fill #1

## 2022-07-30 MED ORDER — ATORVASTATIN CALCIUM 40 MG PO TABS
40.0000 mg | ORAL_TABLET | Freq: Every day | ORAL | 1 refills | Status: DC
Start: 2022-07-30 — End: 2023-01-25
  Filled 2022-07-30 – 2022-09-04 (×2): qty 90, 90d supply, fill #0

## 2022-07-30 MED ORDER — KETOROLAC TROMETHAMINE 60 MG/2ML IM SOLN
60.0000 mg | Freq: Once | INTRAMUSCULAR | Status: AC
Start: 2022-07-30 — End: 2022-07-30
  Administered 2022-07-30: 60 mg via INTRAMUSCULAR

## 2022-07-30 MED ORDER — CYCLOBENZAPRINE HCL 10 MG PO TABS
10.0000 mg | ORAL_TABLET | Freq: Two times a day (BID) | ORAL | 0 refills | Status: DC | PRN
Start: 2022-07-30 — End: 2023-01-25
  Filled 2022-07-30 – 2022-08-08 (×2): qty 20, 10d supply, fill #0

## 2022-07-30 NOTE — Assessment & Plan Note (Signed)
Lab Results  Component Value Date   HGBA1C 9.8 (A) 07/30/2022   Chronic uncontrolled condition I doubt his medication compliance Start Jardiance 25 mg daily continue glipizide 10 mg twice daily Patient counseled on low-carb modified diet Checking urine microalbumin labs today Referral sent to the pharmacist to assist with medication management Need to check blood sugar at home and reporting hypoglycemia discussed Continue atorvastatin 40 mg daily

## 2022-07-30 NOTE — Assessment & Plan Note (Signed)
Flexeril 10 mg twice daily refilled Checking x-ray of the arm pain elbow Take Tylenol 650 mg every 8 hours as needed He is on gabapentin 300 mg twice daily

## 2022-07-30 NOTE — Assessment & Plan Note (Signed)
Chronic condition followed by orthopedics Toradol 60 mg IM injection given in the office today Continue Flexeril 10 mg twice daily as needed, gabapentin 300 mg twice daily, Tylenol as needed Duloxetine 30 mg daily refilled Has upcoming MRI

## 2022-07-30 NOTE — Assessment & Plan Note (Deleted)
BP Readings from Last 3 Encounters:  07/30/22 (!) 140/92  06/13/22 129/87  05/01/22 (!) 154/92  Currently on metoprolol 25 mg , losartan 100 mg daily, hydrochlorothiazide 12.5 mg daily.  I doubt his medication compliance Patient encouraged to take metoprolol 25 mg twice daily, losartan 100 mg daily, hydrochlorothiazide 12.5 mg daily Need to take all medications daily as ordered discussed  Medications refilled no changes in management. Discussed DASH diet and dietary sodium restrictions Continue to increase dietary efforts and exercise. Follow-up in 4 weeks

## 2022-07-30 NOTE — Progress Notes (Signed)
Established Patient Office Visit  Subjective:  Patient ID: Jon Harper., male    DOB: 07/30/1955  Age: 67 y.o. MRN: [id number]  CC:  Chief Complaint  Patient presents with   Follow-up   Arm Pain    Left arm hurting.    HPI Jon Harper. is a 67 y.o.  has a past medical history of Chronic low back pain, Diabetes mellitus, Hyperlipidemia, Hypertension, Neck pain, Neck pain, PAC (premature atrial contraction), Skin rash (10/2018), and Tooth abscess (10/2018).  Patient presents for follow-up for his chronic medical conditions.   Left arm pain patient complains of left arm pain and swelling about a week ago.  He denies trauma.  Stated that the pain was so severe that he could not make a tight fist, pain is better today and swelling is much better.  States that he does a lot of heavy lifting at work currently has aching pain rated 8/10, has limited range of motion of the elbow and arm due to pain.  He has been taking Tylenol as needed  Hypertension.  Currently on hydrochlorothiazide 12.5 mg daily, losartan 100 mg daily, metoprolol 25 mg twice daily.  He denies chest pain, dizziness, edema.  It appears that the patient has not been taking hydrochlorothiazide.  Need to take all medications daily as ordered discussed   Type 2 diabetes.  Currently on Jardiance 10 mg daily, glipizide 10 mg twice daily.  He has not been checking his blood sugar regularly.  No complaints of polyuria, polyphagia, polydipsia.  He has started exercising again and plans on losing weight.  He has been followed by orthopedics for his chronic low back pain, has completed physical therapy but no improvement.  They have a plan of doing an MRI.     Past Medical History:  Diagnosis Date   Chronic low back pain    Diabetes mellitus    Hyperlipidemia    Hypertension    Neck pain    Neck pain    PAC (premature atrial contraction)    Skin rash 10/2018   Tooth abscess 10/2018    Past Surgical History:   Procedure Laterality Date   right nephrectomy      Family History  Problem Relation Age of Onset   Diabetes Mother        non-insulin    Diabetes Father        non-insulin    Diabetes Sister        non-insulin     Social History   Socioeconomic History   Marital status: Single    Spouse name: Not on file   Number of children: Not on file   Years of education: Not on file   Highest education level: Not on file  Occupational History   Not on file  Tobacco Use   Smoking status: Never   Smokeless tobacco: Never  Vaping Use   Vaping Use: Never used  Substance and Sexual Activity   Alcohol use: No   Drug use: Yes    Types: Marijuana    Comment: occ   Sexual activity: Yes    Birth control/protection: Condom  Other Topics Concern   Not on file  Social History Narrative   Divorced.   Does exercise as much as possible.  Tries to go to gym at least 3 days a week.   Social Determinants of Health   Financial Resource Strain: High Risk (12/24/2021)   Overall Financial Resource Strain (CARDIA)  Difficulty of Paying Living Expenses: Very hard  Food Insecurity: Not on file  Transportation Needs: Not on file  Physical Activity: Not on file  Stress: Not on file  Social Connections: Not on file  Intimate Partner Violence: Not on file    Outpatient Medications Prior to Visit  Medication Sig Dispense Refill   gabapentin (NEURONTIN) 300 MG capsule Take 1 capsule (300 mg total) by mouth 2 (two) times daily. 180 capsule 0   glipiZIDE (GLUCOTROL) 10 MG tablet TAKE 1 TABLET (10 MG TOTAL) BY MOUTH 2 (TWO) TIMES DAILY BEFORE A MEAL. 180 tablet 3   HYDROcodone-acetaminophen (NORCO/VICODIN) 5-325 MG tablet Take 1 tablet by mouth every 6 (six) hours as needed. 12 tablet 0   meclizine (ANTIVERT) 25 MG tablet Take 1 tablet (25 mg total) by mouth 3 (three) times daily as needed for dizziness. 30 tablet 0   naproxen (NAPROSYN) 500 MG tablet Take 1 tablet (500 mg total) by mouth 2 (two)  times daily with a meal. As needed for pain 20 tablet 0   atorvastatin (LIPITOR) 40 MG tablet Take 1 tablet (40 mg total) by mouth daily. 90 tablet 0   cyclobenzaprine (FLEXERIL) 10 MG tablet Take 1 tablet (10 mg total) by mouth 2 (two) times daily as needed for muscle spasms. 20 tablet 0   losartan (COZAAR) 50 MG tablet Take 2 tablets (100 mg total) by mouth daily. 180 tablet 3   metoprolol tartrate (LOPRESSOR) 25 MG tablet Take 1 tablet (25 mg total) by mouth in the morning, at noon, and at bedtime. 30 tablet 0   oxyCODONE (ROXICODONE) 5 MG immediate release tablet Take 0.5-1 tablets (2.5-5 mg total) by mouth every 6 (six) hours as needed for severe pain. 6 tablet 0   acetaminophen (TYLENOL 8 HOUR) 650 MG CR tablet Take 1 tablet (650 mg total) by mouth every 8 (eight) hours as needed for pain. (Patient not taking: Reported on 06/14/2022) 20 tablet 0   hydrocortisone cream 0.5 % Apply 1 application topically 2 (two) times daily. (Patient not taking: Reported on 09/06/2021) 30 g 3   DULoxetine (CYMBALTA) 30 MG capsule Take 1 capsule (30 mg total) by mouth daily for 7 days, THEN 2 capsules (60 mg total) daily. (Patient not taking: Reported on 07/30/2022) 60 capsule 11   empagliflozin (JARDIANCE) 10 MG TABS tablet Take 1 tablet (10 mg total) by mouth daily before breakfast. (Patient not taking: Reported on 07/30/2022) 90 tablet 0   hydrochlorothiazide (HYDRODIURIL) 12.5 MG tablet Take 1 tablet (12.5 mg total) by mouth daily. (Patient not taking: Reported on 07/30/2022) 90 tablet 0   metFORMIN (GLUCOPHAGE) 500 MG tablet Take 1 tablet (500 mg total) by mouth 2 (two) times daily with a meal. (Patient not taking: Reported on 03/06/2022) 60 tablet 11   No facility-administered medications prior to visit.    No Known Allergies  ROS Review of Systems  Constitutional:  Negative for activity change, appetite change, chills, diaphoresis, fatigue, fever and unexpected weight change.  HENT:  Negative for  congestion, dental problem, drooling and ear discharge.   Eyes:  Negative for pain, discharge, redness and itching.  Respiratory:  Negative for apnea, cough, choking, chest tightness, shortness of breath and wheezing.   Cardiovascular: Negative.  Negative for chest pain, palpitations and leg swelling.  Gastrointestinal:  Negative for abdominal distention, abdominal pain, anal bleeding, blood in stool, constipation, diarrhea and vomiting.  Endocrine: Negative for polydipsia, polyphagia and polyuria.  Genitourinary:  Negative for difficulty urinating, flank  pain, frequency and genital sores.  Musculoskeletal:  Positive for arthralgias and back pain. Negative for gait problem and joint swelling.  Skin:  Negative for color change, pallor and rash.  Neurological:  Negative for dizziness, facial asymmetry, light-headedness, numbness and headaches.  Psychiatric/Behavioral:  Negative for agitation, behavioral problems, confusion, hallucinations, self-injury, sleep disturbance and suicidal ideas.       Objective:    Physical Exam Vitals and nursing note reviewed.  Constitutional:      General: He is not in acute distress.    Appearance: Normal appearance. He is obese. He is not ill-appearing, toxic-appearing or diaphoretic.  HENT:     Mouth/Throat:     Mouth: Mucous membranes are moist.     Pharynx: Oropharynx is clear. No oropharyngeal exudate or posterior oropharyngeal erythema.  Eyes:     General: No scleral icterus.       Right eye: No discharge.        Left eye: No discharge.     Extraocular Movements: Extraocular movements intact.     Conjunctiva/sclera: Conjunctivae normal.  Cardiovascular:     Rate and Rhythm: Normal rate and regular rhythm.     Pulses: Normal pulses.     Heart sounds: Normal heart sounds. No murmur heard.    No friction rub. No gallop.  Pulmonary:     Effort: Pulmonary effort is normal. No respiratory distress.     Breath sounds: Normal breath sounds. No  stridor. No wheezing, rhonchi or rales.  Chest:     Chest wall: No tenderness.  Abdominal:     General: There is no distension.     Palpations: Abdomen is soft.     Tenderness: There is no abdominal tenderness. There is no right CVA tenderness, left CVA tenderness or guarding.  Musculoskeletal:        General: Tenderness present. No swelling, deformity or signs of injury.     Right lower leg: No edema.     Left lower leg: No edema.     Comments: Tenderness on palpation of left , tenderness on range of motion of left arm.  Skin warm and dry no redness or swelling noted, has palpable radial pulse  Chronic  Skin:    General: Skin is warm and dry.     Capillary Refill: Capillary refill takes less than 2 seconds.     Coloration: Skin is not jaundiced or pale.     Findings: No bruising, erythema or lesion.  Neurological:     Mental Status: He is alert and oriented to person, place, and time.     Motor: No weakness.     Coordination: Coordination normal.     Gait: Gait normal.  Psychiatric:        Mood and Affect: Mood normal.        Behavior: Behavior normal.        Thought Content: Thought content normal.        Judgment: Judgment normal.     BP (!) 140/92   Pulse 98   Ht 6' (1.829 m)   Wt 252 lb 9.6 oz (114.6 kg)   SpO2 99%   BMI 34.26 kg/m  Wt Readings from Last 3 Encounters:  07/30/22 252 lb 9.6 oz (114.6 kg)  06/13/22 250 lb (113.4 kg)  05/01/22 251 lb 15.8 oz (114.3 kg)    Lab Results  Component Value Date   TSH 1.170 01/25/2020   Lab Results  Component Value Date   WBC 7.0 09/06/2021  HGB 13.8 09/06/2021   HCT 41.0 09/06/2021   MCV 82 09/06/2021   PLT 179 09/06/2021   Lab Results  Component Value Date   NA 142 03/06/2022   K 4.7 03/06/2022   CO2 16 (L) 03/06/2022   GLUCOSE 172 (H) 03/06/2022   BUN 20 03/06/2022   CREATININE 1.58 (H) 03/06/2022   BILITOT 0.3 03/06/2022   ALKPHOS 69 03/06/2022   AST 29 03/06/2022   ALT 58 (H) 03/06/2022   PROT 7.5  03/06/2022   ALBUMIN 4.5 03/06/2022   CALCIUM 10.0 03/06/2022   ANIONGAP 9 07/23/2021   EGFR 49 (L) 03/06/2022   Lab Results  Component Value Date   CHOL 141 03/29/2021   Lab Results  Component Value Date   HDL 64 03/29/2021   Lab Results  Component Value Date   LDLCALC 66 03/29/2021   Lab Results  Component Value Date   TRIG 46 03/29/2021   Lab Results  Component Value Date   CHOLHDL 2.2 03/29/2021   Lab Results  Component Value Date   HGBA1C 9.8 (A) 07/30/2022      Assessment & Plan:   Problem List Items Addressed This Visit       Cardiovascular and Mediastinum   PAC (premature atrial contraction)    Continue metoprolol 25 mg twice daily Medication refilled No complaints today      Relevant Medications   atorvastatin (LIPITOR) 40 MG tablet   losartan (COZAAR) 50 MG tablet   hydrochlorothiazide (HYDRODIURIL) 12.5 MG tablet   metoprolol tartrate (LOPRESSOR) 25 MG tablet   Hypertension    BP Readings from Last 3 Encounters:  07/30/22 (!) 140/92  06/13/22 129/87  05/01/22 (!) 154/92  Currently on metoprolol 25 mg , losartan 100 mg daily, hydrochlorothiazide 12.5 mg daily.  I doubt his medication compliance Patient encouraged to take metoprolol 25 mg twice daily, losartan 100 mg daily, hydrochlorothiazide 12.5 mg daily Need to take all medications daily as ordered discussed  Medications refilled no changes in management. Discussed DASH diet and dietary sodium restrictions Continue to increase dietary efforts and exercise. Follow-up in 4 weeks        Relevant Medications   atorvastatin (LIPITOR) 40 MG tablet   losartan (COZAAR) 50 MG tablet   hydrochlorothiazide (HYDRODIURIL) 12.5 MG tablet   metoprolol tartrate (LOPRESSOR) 25 MG tablet   Blood Pressure KIT     Endocrine   Hyperlipidemia associated with type 2 diabetes mellitus (HCC)    Lab Results  Component Value Date   HGBA1C 9.8 (A) 07/30/2022   Chronic uncontrolled condition I doubt his  medication compliance Start Jardiance 25 mg daily continue glipizide 10 mg twice daily Patient counseled on low-carb modified diet Checking urine microalbumin labs today Referral sent to the pharmacist to assist with medication management Need to check blood sugar at home and reporting hypoglycemia discussed Continue atorvastatin 40 mg daily      Relevant Medications   empagliflozin (JARDIANCE) 25 MG TABS tablet   atorvastatin (LIPITOR) 40 MG tablet   losartan (COZAAR) 50 MG tablet   hydrochlorothiazide (HYDRODIURIL) 12.5 MG tablet   metoprolol tartrate (LOPRESSOR) 25 MG tablet   glucose blood test strip   Lancets (ONETOUCH DELICA PLUS LANCET33G) MISC   Blood Glucose Monitoring Suppl (ONETOUCH VERIO FLEX SYSTEM) w/Device KIT   Other Relevant Orders   POCT glycosylated hemoglobin (Hb A1C) (Completed)   CMP14+EGFR   Microalbumin / creatinine urine ratio   AMB Referral to Pharmacy Medication Management     Nervous  and Auditory   Chronic bilateral low back pain with bilateral sciatica - Primary    Chronic condition followed by orthopedics Toradol 60 mg IM injection given in the office today Continue Flexeril 10 mg twice daily as needed, gabapentin 300 mg twice daily, Tylenol as needed Duloxetine 30 mg daily refilled Has upcoming MRI      Relevant Medications   cyclobenzaprine (FLEXERIL) 10 MG tablet   DULoxetine (CYMBALTA) 30 MG capsule     Genitourinary   Stage 3 chronic kidney disease (HCC)    Lab Results  Component Value Date   NA 142 03/06/2022   K 4.7 03/06/2022   CO2 16 (L) 03/06/2022   BUN 20 03/06/2022   CREATININE 1.58 (H) 03/06/2022   CALCIUM 10.0 03/06/2022   GLUCOSE 172 (H) 03/06/2022  Patient encouraged to drink at least 64 ounces of water daily to maintain hydration Currently on losartan 100 mg daily CMP today      Relevant Medications   empagliflozin (JARDIANCE) 25 MG TABS tablet   Other Relevant Orders   CMP14+EGFR     Other   Left arm pain     Flexeril 10 mg twice daily refilled Checking x-ray of the arm pain elbow Take Tylenol 650 mg every 8 hours as needed He is on gabapentin 300 mg twice daily      Relevant Medications   cyclobenzaprine (FLEXERIL) 10 MG tablet   Other Relevant Orders   DG Forearm Left   DG Elbow 2 Views Left   Other Visit Diagnoses     Depression, unspecified depression type       Relevant Medications   DULoxetine (CYMBALTA) 30 MG capsule       Meds ordered this encounter  Medications   cyclobenzaprine (FLEXERIL) 10 MG tablet    Sig: Take 1 tablet (10 mg total) by mouth 2 (two) times daily as needed for muscle spasms.    Dispense:  20 tablet    Refill:  0   empagliflozin (JARDIANCE) 25 MG TABS tablet    Sig: Take 1 tablet (25 mg total) by mouth daily before breakfast.    Dispense:  90 tablet    Refill:  1   DULoxetine (CYMBALTA) 30 MG capsule    Sig: Take 1 capsule (30 mg total) by mouth daily for 7 days, THEN 2 capsules (60 mg total) daily.    Dispense:  60 capsule    Refill:  11   atorvastatin (LIPITOR) 40 MG tablet    Sig: Take 1 tablet (40 mg total) by mouth daily.    Dispense:  90 tablet    Refill:  1   losartan (COZAAR) 50 MG tablet    Sig: Take 2 tablets (100 mg total) by mouth daily.    Dispense:  180 tablet    Refill:  3   hydrochlorothiazide (HYDRODIURIL) 12.5 MG tablet    Sig: Take 1 tablet (12.5 mg total) by mouth daily.    Dispense:  90 tablet    Refill:  0   metoprolol tartrate (LOPRESSOR) 25 MG tablet    Sig: Take 1 tablet (25 mg total) by mouth 2 (two) times daily.    Dispense:  180 tablet    Refill:  1   glucose blood test strip    Sig: Use as instructed to check blood sugars twice a day.    Dispense:  200 each    Refill:  12   Lancets (ONETOUCH DELICA PLUS LANCET33G) MISC    Sig: 1  each by Does not apply route in the morning and at bedtime.    Dispense:  200 each    Refill:  11   Blood Glucose Monitoring Suppl (ONETOUCH VERIO FLEX SYSTEM) w/Device KIT    Sig:  1 each by Does not apply route once for 1 dose.    Dispense:  1 kit    Refill:  0   Blood Pressure KIT    Sig: 1 each by Does not apply route once a week.    Dispense:  1 kit    Refill:  0   ketorolac (TORADOL) injection 60 mg    Follow-up: Return in about 4 weeks (around 08/27/2022) for HTN, DM.    Donell Beers, FNP

## 2022-07-30 NOTE — Assessment & Plan Note (Signed)
BP Readings from Last 3 Encounters:  07/30/22 (!) 140/92  06/13/22 129/87  05/01/22 (!) 154/92  Currently on metoprolol 25 mg , losartan 100 mg daily, hydrochlorothiazide 12.5 mg daily.  I doubt his medication compliance Patient encouraged to take metoprolol 25 mg twice daily, losartan 100 mg daily, hydrochlorothiazide 12.5 mg daily Need to take all medications daily as ordered discussed  Medications refilled no changes in management. Discussed DASH diet and dietary sodium restrictions Continue to increase dietary efforts and exercise. Follow-up in 4 weeks   

## 2022-07-30 NOTE — Patient Instructions (Addendum)
  1. Type 2 diabetes mellitus without complication, without long-term current use of insulin (HCC) - POCT glycosylated hemoglobin (Hb A1C) - CMP14+EGFR - Microalbumin / creatinine urine ratio - AMB Referral to Pharmacy Medication Management - empagliflozin (JARDIANCE) 25 MG TABS tablet; Take 1 tablet (25 mg total) by mouth daily before breakfast.  Dispense: 90 tablet; Refill: 1  2. Chronic bilateral low back pain with bilateral sciatica  - cyclobenzaprine (FLEXERIL) 10 MG tablet; Take 1 tablet (10 mg total) by mouth 2 (two) times daily as needed for muscle spasms.  Dispense: 20 tablet; Refill: 0 - DULoxetine (CYMBALTA) 30 MG capsule; Take 1 capsule (30 mg total) by mouth daily for 7 days, THEN 2 capsules (60 mg total) daily.  Dispense: 60 capsule; Refill: 11  3. Stage 3a chronic kidney disease (HCC)  - CMP14+EGFR - empagliflozin (JARDIANCE) 25 MG TABS tablet; Take 1 tablet (25 mg total) by mouth daily before breakfast.  Dispense: 90 tablet; Refill: 1  4. Hyperlipidemia, unspecified hyperlipidemia type   5. Left arm pain  - cyclobenzaprine (FLEXERIL) 10 MG tablet; Take 1 tablet (10 mg total) by mouth 2 (two) times daily as needed for muscle spasms.  Dispense: 20 tablet; Refill: 0  6. Hypertension, unspecified type  - losartan (COZAAR) 50 MG tablet; Take 2 tablets (100 mg total) by mouth daily.  Dispense: 180 tablet; Refill: 3 - hydrochlorothiazide (HYDRODIURIL) 12.5 MG tablet; Take 1 tablet (12.5 mg total) by mouth daily.  Dispense: 90 tablet; Refill: 0 - metoprolol tartrate (LOPRESSOR) 25 MG tablet; Take 1 tablet (25 mg total) by mouth 2 (two) times daily.  Dispense: 180 tablet; Refill: 1    8. Depression, unspecified depression type  - DULoxetine (CYMBALTA) 30 MG capsule; Take 1 capsule (30 mg total) by mouth daily for 7 days, THEN 2 capsules (60 mg total) daily.  Dispense: 60 capsule; Refill: 11  9. Hyperlipidemia associated with type 2 diabetes mellitus (HCC)  - atorvastatin  (LIPITOR) 40 MG tablet; Take 1 tablet (40 mg total) by mouth daily.  Dispense: 90 tablet; Refill: 1    It is important that you exercise regularly at least 30 minutes 5 times a week as tolerated  Think about what you will eat, plan ahead. Choose " clean, green, fresh or frozen" over canned, processed or packaged foods which are more sugary, salty and fatty. 70 to 75% of food eaten should be vegetables and fruit. Three meals at set times with snacks allowed between meals, but they must be fruit or vegetables. Aim to eat over a 12 hour period , example 7 am to 7 pm, and STOP after  your last meal of the day. Drink water,generally about 64 ounces per day, no other drink is as healthy. Fruit juice is best enjoyed in a healthy way, by EATING the fruit.  Thanks for choosing [facility] we consider it a privelige to serve you.

## 2022-07-30 NOTE — Assessment & Plan Note (Signed)
Continue metoprolol 25 mg twice daily Medication refilled No complaints today

## 2022-07-30 NOTE — Assessment & Plan Note (Addendum)
Lab Results  Component Value Date   NA 142 03/06/2022   K 4.7 03/06/2022   CO2 16 (L) 03/06/2022   BUN 20 03/06/2022   CREATININE 1.58 (H) 03/06/2022   CALCIUM 10.0 03/06/2022   GLUCOSE 172 (H) 03/06/2022  Patient encouraged to drink at least 64 ounces of water daily to maintain hydration Currently on losartan 100 mg daily CMP today

## 2022-07-30 NOTE — Assessment & Plan Note (Addendum)
Lab Results  Component Value Date   HGBA1C 9.8 (A) 07/30/2022

## 2022-07-31 ENCOUNTER — Other Ambulatory Visit: Payer: Self-pay

## 2022-07-31 ENCOUNTER — Ambulatory Visit (HOSPITAL_COMMUNITY)
Admission: RE | Admit: 2022-07-31 | Discharge: 2022-07-31 | Disposition: A | Discharge status: Home / Self Care | Payer: Medicare Other | Source: Ambulatory Visit | Attending: Nurse Practitioner | Admitting: Nurse Practitioner

## 2022-07-31 DIAGNOSIS — M79602 Pain in left arm: Secondary | ICD-10-CM | POA: Diagnosis present

## 2022-07-31 LAB — CMP14+EGFR
ALT: 41 IU/L (ref 0–44)
AST: 23 IU/L (ref 0–40)
Albumin/Globulin Ratio: 1.4 (ref 1.2–2.2)
Albumin: 4.2 g/dL (ref 3.9–4.9)
Alkaline Phosphatase: 80 IU/L (ref 44–121)
BUN/Creatinine Ratio: 13 (ref 10–24)
BUN: 18 mg/dL (ref 8–27)
Bilirubin Total: 0.5 mg/dL (ref 0.0–1.2)
CO2: 20 mmol/L (ref 20–29)
Calcium: 9.6 mg/dL (ref 8.6–10.2)
Chloride: 102 mmol/L (ref 96–106)
Creatinine, Ser: 1.38 mg/dL — ABNORMAL HIGH (ref 0.76–1.27)
Globulin, Total: 3.1 g/dL (ref 1.5–4.5)
Glucose: 250 mg/dL — ABNORMAL HIGH (ref 70–99)
Potassium: 4.3 mmol/L (ref 3.5–5.2)
Sodium: 137 mmol/L (ref 134–144)
Total Protein: 7.3 g/dL (ref 6.0–8.5)
eGFR: 57 mL/min/{1.73_m2} — ABNORMAL LOW (ref >59)

## 2022-08-01 ENCOUNTER — Other Ambulatory Visit: Payer: Self-pay

## 2022-08-02 ENCOUNTER — Telehealth: Payer: Self-pay

## 2022-08-02 NOTE — Progress Notes (Unsigned)
   Care Guide Note  08/02/2022 Name: Jon Harper. MRN: [id number] DOB: 1955/09/13  Referred by: Donell Beers, FNP Reason for referral : Care Coordination (Outreach to schedule with pharm d )   [name] is a 67 y.o. year old male who is a primary care patient of Donell Beers, FNP. [name] was referred to the pharmacist for assistance related to HTN and DM.    An unsuccessful telephone outreach was attempted today to contact the patient who was referred to the pharmacy team for assistance with medication management. Additional attempts will be made to contact the patient.   Penne Lash, RMA Care Guide [facility]  [place], [place] [address] Direct Dial: [phone] [email or web address]

## 2022-08-06 ENCOUNTER — Telehealth: Payer: Self-pay

## 2022-08-06 NOTE — Telephone Encounter (Signed)
Left a message. GH

## 2022-08-06 NOTE — Progress Notes (Unsigned)
   Care Guide Note  08/06/2022 Name: Jon Harper. MRN: [id number] DOB: 09-Mar-1956  Referred by: Donell Beers, FNP Reason for referral : Care Coordination (Outreach to schedule with pharm d )   Jon Lias. is a 67 y.o. year old male who is a primary care patient of Donell Beers, FNP. Jon Lias. was referred to the pharmacist for assistance related to HTN and DM.    A second unsuccessful telephone outreach was attempted today to contact the patient who was referred to the pharmacy team for assistance with medication management. Additional attempts will be made to contact the patient.  Penne Lash, RMA Care Guide [facility]  [place], [place] [address] Direct Dial: [phone] [email or web address]

## 2022-08-07 ENCOUNTER — Other Ambulatory Visit: Payer: Self-pay

## 2022-08-08 ENCOUNTER — Other Ambulatory Visit: Payer: Self-pay

## 2022-08-08 NOTE — Progress Notes (Signed)
   Care Guide Note  08/08/2022 Name: [name]. MRN: [id number] DOB: 07-15-1955  Referred by: Donell Beers, FNP Reason for referral : Care Coordination (Outreach to schedule with pharm d )   Jon Harper. is a 67 y.o. year old male who is a primary care patient of Donell Beers, FNP. Jon Harper. was referred to the pharmacist for assistance related to HTN and DM.    Successful contact was made with the patient to discuss pharmacy services including being ready for the pharmacist to call at least 5 minutes before the scheduled appointment time, to have medication bottles and any blood sugar or blood pressure readings ready for review. The patient agreed to meet with the pharmacist via with the pharmacist via telephone visit on (date/time).  09/02/2022  Penne Lash, RMA Care Guide [facility]  [place], [place] [address] Direct Dial: [phone] [email or web address]

## 2022-08-26 ENCOUNTER — Other Ambulatory Visit: Payer: Self-pay

## 2022-08-26 ENCOUNTER — Emergency Department (HOSPITAL_BASED_OUTPATIENT_CLINIC_OR_DEPARTMENT_OTHER): Payer: Medicare Other | Admitting: Radiology

## 2022-08-26 ENCOUNTER — Encounter (HOSPITAL_BASED_OUTPATIENT_CLINIC_OR_DEPARTMENT_OTHER): Payer: Self-pay | Admitting: Emergency Medicine

## 2022-08-26 ENCOUNTER — Emergency Department (HOSPITAL_BASED_OUTPATIENT_CLINIC_OR_DEPARTMENT_OTHER)
Admission: EM | Admit: 2022-08-26 | Discharge: 2022-08-26 | Disposition: A | Discharge status: Home / Self Care | Payer: Medicare Other | Attending: Emergency Medicine | Admitting: Emergency Medicine

## 2022-08-26 DIAGNOSIS — I10 Essential (primary) hypertension: Secondary | ICD-10-CM | POA: Diagnosis not present

## 2022-08-26 DIAGNOSIS — R0782 Intercostal pain: Secondary | ICD-10-CM | POA: Diagnosis present

## 2022-08-26 DIAGNOSIS — Z7984 Long term (current) use of oral hypoglycemic drugs: Secondary | ICD-10-CM | POA: Insufficient documentation

## 2022-08-26 DIAGNOSIS — E119 Type 2 diabetes mellitus without complications: Secondary | ICD-10-CM | POA: Diagnosis not present

## 2022-08-26 DIAGNOSIS — Z794 Long term (current) use of insulin: Secondary | ICD-10-CM | POA: Insufficient documentation

## 2022-08-26 DIAGNOSIS — Z79899 Other long term (current) drug therapy: Secondary | ICD-10-CM | POA: Diagnosis not present

## 2022-08-26 DIAGNOSIS — R0789 Other chest pain: Secondary | ICD-10-CM | POA: Insufficient documentation

## 2022-08-26 MED ORDER — LIDOCAINE 5 % EX PTCH
1.0000 | MEDICATED_PATCH | CUTANEOUS | Status: DC
  Administered 2022-08-26: 1 via TRANSDERMAL
  Filled 2022-08-26: qty 1

## 2022-08-26 MED ORDER — METHOCARBAMOL 500 MG PO TABS
1000.0000 mg | ORAL_TABLET | Freq: Four times a day (QID) | ORAL | 0 refills | Status: DC
  Filled 2022-08-26: qty 30, 4d supply, fill #0

## 2022-08-26 MED ORDER — ACETAMINOPHEN 500 MG PO TABS
1000.0000 mg | ORAL_TABLET | Freq: Once | ORAL | Status: AC
  Administered 2022-08-26: 1000 mg via ORAL
  Filled 2022-08-26: qty 2

## 2022-08-26 MED ORDER — ACETAMINOPHEN 325 MG PO TABS
650.0000 mg | ORAL_TABLET | Freq: Four times a day (QID) | ORAL | 0 refills | Status: AC | PRN
  Filled 2022-08-26: qty 30, 4d supply, fill #0

## 2022-08-26 NOTE — ED Notes (Signed)
Patient verbalizes understanding of discharge instructions. Opportunity for questioning and answers were provided. Patient discharged from ED.  °

## 2022-08-26 NOTE — ED Triage Notes (Signed)
Pt arrives to ED with c/o right sided flank, back, and ribcage pain x2 weeks. He notes it feels like a pulled muscle from lifting something heavy. He notes he tried lifting today and the pain worsened.

## 2022-08-26 NOTE — ED Provider Notes (Signed)
[place] EMERGENCY DEPARTMENT AT [facility] Provider Note   CSN: [id number] Arrival date & time: 08/26/22  1534     History  Chief Complaint  Patient presents with   Flank Pain   Back Pain   Rib Injury    Jon Harper. is a 67 y.o. male.  Patient with history of hypertension, diabetes, high cholesterol -- presents to the emergency department today for evaluation of right sided rib cage pain.  Patient states that the pain developed a couple of weeks ago.  He denies any known injuries but states that he thinks he "pulled a muscle".  Symptoms were worse today prompting emergency department visit.  He has extreme pain with movement of his upper body or palpation of his chest wall.  No fevers or cough.  No shortness of breath.  No nausea, vomiting, or diarrhea.  He has tried Tylenol and aspirin "a couple of times" without improvement.  No hemoptysis.  Symptoms are nonexertional.  No associated diaphoresis.       Home Medications Prior to Admission medications   Medication Sig Start Date End Date Taking? Authorizing Provider  acetaminophen (TYLENOL 8 HOUR) 650 MG CR tablet Take 1 tablet (650 mg total) by mouth every 8 (eight) hours as needed for pain. Patient not taking: Reported on 06/14/2022 12/05/21   Ivonne Andrew, NP  atorvastatin (LIPITOR) 40 MG tablet Take 1 tablet (40 mg total) by mouth daily. 07/30/22   Donell Beers, FNP  Blood Pressure KIT 1 each by Does not apply route once a week. 07/30/22   Paseda, Baird Kay, FNP  cyclobenzaprine (FLEXERIL) 10 MG tablet Take 1 tablet (10 mg total) by mouth 2 (two) times daily as needed for muscle spasms. 07/30/22   Paseda, Baird Kay, FNP  DULoxetine (CYMBALTA) 30 MG capsule Take 1 capsule (30 mg total) by mouth daily for 7 days, THEN 2 capsules (60 mg total) daily. 07/30/22 07/29/23  Donell Beers, FNP  empagliflozin (JARDIANCE) 25 MG TABS tablet Take 1 tablet (25 mg total) by mouth daily before breakfast. 07/30/22    Paseda, Baird Kay, FNP  gabapentin (NEURONTIN) 300 MG capsule Take 1 capsule (300 mg total) by mouth 2 (two) times daily. 05/01/22   Paseda, Baird Kay, FNP  glipiZIDE (GLUCOTROL) 10 MG tablet TAKE 1 TABLET (10 MG TOTAL) BY MOUTH 2 (TWO) TIMES DAILY BEFORE A MEAL. 03/06/22   Paseda, Phillips Grout R, FNP  glucose blood test strip Use as instructed to check blood sugars twice a day. 07/30/22   Paseda, Baird Kay, FNP  hydrochlorothiazide (HYDRODIURIL) 12.5 MG tablet Take 1 tablet (12.5 mg total) by mouth daily. 07/30/22   Donell Beers, FNP  HYDROcodone-acetaminophen (NORCO/VICODIN) 5-325 MG tablet Take 1 tablet by mouth every 6 (six) hours as needed. 06/13/22   Linwood Dibbles, MD  hydrocortisone cream 0.5 % Apply 1 application topically 2 (two) times daily. Patient not taking: Reported on 09/06/2021 10/26/18   Kallie Locks, FNP  Lancets [facility] DELICA PLUS [place]) MISC 1 each by Does not apply route in the morning and at bedtime. 07/30/22   Paseda, Baird Kay, FNP  losartan (COZAAR) 50 MG tablet Take 2 tablets (100 mg total) by mouth daily. 07/30/22   Donell Beers, FNP  meclizine (ANTIVERT) 25 MG tablet Take 1 tablet (25 mg total) by mouth 3 (three) times daily as needed for dizziness. 09/06/21   Ivonne Andrew, NP  metoprolol tartrate (LOPRESSOR) 25 MG tablet Take 1 tablet (25  mg total) by mouth 2 (two) times daily. 07/30/22 11/27/22  Donell Beers, FNP  naproxen (NAPROSYN) 500 MG tablet Take 1 tablet (500 mg total) by mouth 2 (two) times daily with a meal. As needed for pain 06/13/22   Linwood Dibbles, MD      Allergies    Patient has no known allergies.    Review of Systems   Review of Systems  Physical Exam Updated Vital Signs BP (!) 147/103 (BP Location: Right Arm)   Pulse 95   Temp 97.8 F (36.6 C)   Resp 18   Ht 6' (1.829 m)   Wt 113.4 kg   SpO2 100%   BMI 33.91 kg/m   Physical Exam Vitals and nursing note reviewed.  Constitutional:      Appearance: He is  well-developed. He is not diaphoretic.  HENT:     Head: Normocephalic and atraumatic.     Mouth/Throat:     Mouth: Mucous membranes are not dry.  Eyes:     Conjunctiva/sclera: Conjunctivae normal.  Neck:     Vascular: Normal carotid pulses. No carotid bruit or JVD.     Trachea: Trachea normal. No tracheal deviation.  Cardiovascular:     Rate and Rhythm: Normal rate and regular rhythm.     Pulses: No decreased pulses.          Radial pulses are 2+ on the right side and 2+ on the left side.     Heart sounds: Normal heart sounds, S1 normal and S2 normal. Heart sounds not distant. No murmur heard. Pulmonary:     Effort: Pulmonary effort is normal. No respiratory distress.     Breath sounds: Normal breath sounds. No wheezing.  Chest:     Chest wall: Tenderness present.     Comments: Patient winces when I palpate over the anterior and lateral inferior ribs.  No significant pain over the right upper or lower abdomen.  No signs of ecchymosis or skin rash. Abdominal:     General: Bowel sounds are normal.     Palpations: Abdomen is soft.     Tenderness: There is no abdominal tenderness. There is no guarding or rebound.  Musculoskeletal:     Cervical back: Normal range of motion and neck supple. No muscular tenderness.     Right lower leg: No edema.     Left lower leg: No edema.  Skin:    General: Skin is warm and dry.     Coloration: Skin is not pale.  Neurological:     Mental Status: He is alert. Mental status is at baseline.  Psychiatric:        Mood and Affect: Mood normal.     ED Results / Procedures / Treatments   Labs (all labs ordered are listed, but only abnormal results are displayed) Labs Reviewed - No data to display  EKG EKG Interpretation  Date/Time:  Monday August 26 2022 16:47:54 EDT Ventricular Rate:  84 PR Interval:  196 QRS Duration: 74 QT Interval:  338 QTC Calculation: 399 R Axis:   19 Text Interpretation: Normal sinus rhythm Cannot rule out Anterior  infarct , age undetermined Abnormal ECG When compared with ECG of 23-Jul-2021 10:46, No significant change was found Confirmed by Vanetta Mulders [id number]) on 08/26/2022 4:53:35 PM  Radiology DG Ribs Unilateral W/Chest Right  Result Date: 08/26/2022 CLINICAL DATA:  Flank, back and ribcage pain for 2 weeks. No history of trauma EXAM: RIGHT RIBS AND CHEST - 3 VIEW COMPARISON:  X-ray  07/23/2021 FINDINGS: No consolidation, pneumothorax or effusion. No edema. Tortuous aorta. Normal cardiopericardial silhouette. IMPRESSION: No rib fracture seen.  No pneumothorax. Electronically Signed   By: Karen Kays M.D.   On: 08/26/2022 18:12    Procedures Procedures    Medications Ordered in ED Medications  lidocaine (LIDODERM) 5 % 1 patch (1 patch Transdermal Patch Applied 08/26/22 1648)  acetaminophen (TYLENOL) tablet 1,000 mg (1,000 mg Oral Given 08/26/22 1841)    ED Course/ Medical Decision Making/ A&P    Patient seen and examined. History obtained directly from patient.   Labs/EKG: EKG ordered, although symptoms are very reproducible  Imaging: Ordered chest x-ray with ribs.  Medications/Fluids: Ordered: Lidoderm patch.   Most recent vital signs reviewed and are as follows: BP (!) 147/103 (BP Location: Right Arm)   Pulse 95   Temp 97.8 F (36.6 C)   Resp 18   Ht 6' (1.829 m)   Wt 113.4 kg   SpO2 100%   BMI 33.91 kg/m   Initial impression: Chest wall pain pain  6:48 PM Reassessment performed. Patient appears stable.  Appears comfortable.  Labs personally reviewed and interpreted including: EKG personally reviewed as above.  Imaging personally visualized and interpreted including: Chest x-ray, agree negative  Reviewed pertinent lab work and imaging with patient at bedside. Questions answered.   Most current vital signs reviewed and are as follows: BP (!) 147/103 (BP Location: Right Arm)   Pulse 95   Temp 97.8 F (36.6 C)   Resp 18   Ht 6' (1.829 m)   Wt 113.4 kg   SpO2 100%    BMI 33.91 kg/m   Plan: Discharge to home.   Prescriptions written for: Robaxin and Tylenol  Patient counseled on proper use of muscle relaxant medication.  They were told not to drink alcohol, drive any vehicle, or do any dangerous activities while taking this medication.  Patient verbalized understanding.  Other home care instructions discussed: Use of ice and heat, rest, no heavy lifting, pushing, pulling  ED return instructions discussed: Worsening or changing chest pain, shortness of breath, vomiting, abdominal pain.  Follow-up instructions discussed: Patient encouraged to follow-up with their PCP in 3 days.                             Medical Decision Making Amount and/or Complexity of Data Reviewed Radiology: ordered.  Risk OTC drugs. Prescription drug management.   Patient with right-sided chest wall pain easily reproducible with palpation and movement.  X-ray does not show any signs of problems with the lungs, pneumothorax.  No rib fractures.  EKG stable.  Low concern for ACS, PE, dissection at this time.  He is not appreciably tender over the abdomen and I have low concern for intra-abdominal etiology such as hepatitis, gallbladder disease, traumatic injury.  The patient's vital signs, pertinent lab work and imaging were reviewed and interpreted as discussed in the ED course. Hospitalization was considered for further testing, treatments, or serial exams/observation. However as patient is well-appearing, has a stable exam, and reassuring studies today, I do not feel that they warrant admission at this time. This plan was discussed with the patient who verbalizes agreement and comfort with this plan and seems reliable and able to return to the Emergency Department with worsening or changing symptoms.          Final Clinical Impression(s) / ED Diagnoses Final diagnoses:  Chest wall pain    Rx /  DC Orders ED Discharge Orders     None         Renne Crigler,  Cordelia Poche 08/26/22 1849    Vanetta Mulders, MD 08/29/22 1359

## 2022-08-26 NOTE — Discharge Instructions (Signed)
Please read and follow all provided instructions.  Your diagnoses today include:  1. Chest wall pain     Tests performed today include: An EKG of your heart A chest x-ray: No sign of rib or lung problems Vital signs. See below for your results today.   Medications prescribed:  Robaxin (methocarbamol) - muscle relaxer medication  DO NOT drive or perform any activities that require you to be awake and alert because this medicine can make you drowsy.   Take any prescribed medications only as directed.  Follow-up instructions: Please follow-up with your primary care provider as soon as you can for further evaluation of your symptoms.   Return instructions:  SEEK IMMEDIATE MEDICAL ATTENTION IF: You have severe chest pain, especially if the pain is crushing or pressure-like and spreads to the arms, back, neck, or jaw, or if you have sweating, nausea or vomiting, or trouble with breathing. THIS IS AN EMERGENCY. Do not wait to see if the pain will go away. Get medical help at once. Call 911. DO NOT drive yourself to the hospital.  Your chest pain gets worse and does not go away after a few minutes of rest.  You have an attack of chest pain lasting longer than what you usually experience.  You have significant dizziness, if you pass out, or have trouble walking.  You have chest pain not typical of your usual pain for which you originally saw your caregiver.  You have any other emergent concerns regarding your health.  Additional Information: Chest pain comes from many different causes. Your caregiver has diagnosed you as having chest pain that is not specific for one problem, but does not require admission.  You are at low risk for an acute heart condition or other serious illness.   Your vital signs today were: BP (!) 147/103 (BP Location: Right Arm)   Pulse 95   Temp 97.8 F (36.6 C)   Resp 18   Ht 6' (1.829 m)   Wt 113.4 kg   SpO2 100%   BMI 33.91 kg/m  If your blood pressure  (BP) was elevated above 135/85 this visit, please have this repeated by your doctor within one month. --------------

## 2022-08-27 ENCOUNTER — Other Ambulatory Visit: Payer: Self-pay

## 2022-08-27 ENCOUNTER — Telehealth: Payer: Self-pay

## 2022-08-27 NOTE — Transitions of Care (Post Inpatient/ED Visit) (Signed)
   08/27/2022  Name: Jon Harper. MRN: [id number] DOB: [birth year]  Today's TOC FU Call Status: Today's TOC FU Call Status:: Unsuccessul Call (1st Attempt) Unsuccessful Call (1st Attempt) Date: 08/27/22  Attempted to reach the patient regarding the most recent Inpatient/ED visit.  Follow Up Plan: Additional outreach attempts will be made to reach the patient to complete the Transitions of Care (Post Inpatient/ED visit) call.   Signature Renelda Loma RMA

## 2022-08-29 ENCOUNTER — Telehealth: Payer: Self-pay

## 2022-08-29 NOTE — Transitions of Care (Post Inpatient/ED Visit) (Signed)
   08/29/2022  Name: Jon Harper. MRN: [id number] DOB: [birth year]  Today's TOC FU Call Status: Today's TOC FU Call Status:: Unsuccessful Call (2nd Attempt) Unsuccessful Call (2nd Attempt) Date: 08/29/22  Attempted to reach the patient regarding the most recent Inpatient/ED visit.  Follow Up Plan: Additional outreach attempts will be made to reach the patient to complete the Transitions of Care (Post Inpatient/ED visit) call.   Signature Renelda Loma RMA

## 2022-08-30 ENCOUNTER — Telehealth: Payer: Self-pay

## 2022-08-30 NOTE — Transitions of Care (Post Inpatient/ED Visit) (Cosign Needed)
   08/30/2022  Name: Jon Harper. MRN: [id number] DOB: 03-Oct-1955  Today's TOC FU Call Status: Today's TOC FU Call Status:: Unsuccessful Call (3rd Attempt) Unsuccessful Call (3rd Attempt) Date: 08/30/22  Attempted to reach the patient regarding the most recent Inpatient/ED visit.  Follow Up Plan: No further outreach attempts will be made at this time. We have been unable to contact the patient.  Signature Renelda Loma RMA

## 2022-09-02 ENCOUNTER — Other Ambulatory Visit: Payer: Medicare Other | Admitting: Pharmacist

## 2022-09-02 ENCOUNTER — Telehealth: Payer: Self-pay | Admitting: Pharmacist

## 2022-09-02 NOTE — Progress Notes (Unsigned)
Attempted to contact patient for scheduled appointment for medication management. Left HIPAA compliant message for patient to return my call at their convenience.   Catie T. Orry Sigl, PharmD, BCACP, CPP Clinical Pharmacist [facility] [phone]  

## 2022-09-03 ENCOUNTER — Other Ambulatory Visit: Payer: Medicare Other

## 2022-09-03 ENCOUNTER — Ambulatory Visit: Payer: Self-pay | Admitting: Nurse Practitioner

## 2022-09-04 ENCOUNTER — Ambulatory Visit
Admission: RE | Admit: 2022-09-04 | Discharge: 2022-09-04 | Disposition: A | Discharge status: Home / Self Care | Payer: Medicare Other | Source: Ambulatory Visit | Attending: Neurosurgery | Admitting: Neurosurgery

## 2022-09-04 ENCOUNTER — Other Ambulatory Visit: Payer: Self-pay

## 2022-09-04 DIAGNOSIS — M5441 Lumbago with sciatica, right side: Secondary | ICD-10-CM

## 2022-09-09 ENCOUNTER — Ambulatory Visit: Payer: Self-pay | Admitting: Nurse Practitioner

## 2022-09-09 NOTE — Progress Notes (Deleted)
Jon Harper. is a 67 y.o. male who presents for annual wellness visit and follow-up on chronic medical conditions.  He has the following concerns:   Immunizations and Health Maintenance Immunization History  Administered Date(s) Administered   Influenza,inj,Quad PF,6+ Mos 05/14/2017, 04/01/2018, 12/27/2020, 03/06/2022   Health Maintenance Due  Topic Date Due   Medicare Annual Wellness (AWV)  Never done   COVID-19 Vaccine (1) Never done   OPHTHALMOLOGY EXAM  Never done   DTaP/Tdap/Td (1 - Tdap) Never done   Zoster Vaccines- Shingrix (1 of 2) Never done   Colonoscopy  Never done   Pneumonia Vaccine 72+ Years old (1 of 1 - PCV) Never done    Last colonoscopy: Last PSA: Dentist: Ophtho: Exercise:  Other doctors caring for patient include:  Advanced Directives:    Depression screen:  See questionnaire below.        07/30/2022    2:13 PM 03/06/2022    3:31 PM 12/05/2021    2:36 PM 09/06/2021    3:31 PM 03/29/2021    3:09 PM  Depression screen PHQ 2/9  Decreased Interest 0 0 0 0 0  Down, Depressed, Hopeless 0 0 0 0 0  PHQ - 2 Score 0 0 0 0 0  Altered sleeping   1    Tired, decreased energy   3    Change in appetite   3    Trouble concentrating   2    Moving slowly or fidgety/restless   2    Suicidal thoughts   0    PHQ-9 Score   11      Fall Screen: See Questionaire below.      07/30/2022    2:12 PM 12/05/2021    2:36 PM 09/06/2021    3:31 PM 03/29/2021    3:09 PM 03/07/2021    2:22 PM  Fall Risk   Falls in the past year? 1 0 0 0 0  Number falls in past yr: 1 0 0 0 0  Injury with Fall? 1 0 0 0 0  Comment Back injure      Risk for fall due to : History of fall(s) No Fall Risks No Fall Risks    Follow up Education provided Falls evaluation completed Falls evaluation completed      ADL screen:  See questionnaire below.  Functional Status Survey:     Review of Systems  Constitutional: -, -unexpected weight change, -anorexia, -fatigue Allergy:  -sneezing, -itching, -congestion Dermatology: denies changing moles, rash, lumps ENT: -runny nose, -ear pain, -sore throat,  Cardiology:  -chest pain, -palpitations, -orthopnea, Respiratory: -cough, -shortness of breath, -dyspnea on exertion, -wheezing,  Gastroenterology: -abdominal pain, -nausea, -vomiting, -diarrhea, -constipation, -dysphagia Hematology: -bleeding or bruising problems Musculoskeletal: -arthralgias, -myalgias, -joint swelling, -back pain, - Ophthalmology: -vision changes,  Urology: -dysuria, -difficulty urinating,  -urinary frequency, -urgency, incontinence Neurology: -, -numbness, , -memory loss, -falls, -dizziness    PHYSICAL EXAM:  There were no vitals taken for this visit.  General Appearance: Alert, cooperative, no distress, appears stated age Head: Normocephalic, without obvious abnormality, atraumatic Eyes: PERRL, conjunctiva/corneas clear, EOM's intact, fundi benign Ears: Normal TM's and external ear canals Nose: Nares normal, mucosa normal, no drainage or sinus   tenderness Throat: Lips, mucosa, and tongue normal; teeth and gums normal Neck: Supple, no lymphadenopathy, thyroid:no enlargement/tenderness/nodules; no carotid bruit or JVD Lungs: Clear to auscultation bilaterally without wheezes, rales or ronchi; respirations unlabored Heart: Regular rate and rhythm, S1 and S2 normal, no murmur, rub  or gallop Abdomen: Soft, non-tender, nondistended, normoactive bowel sounds, no masses, no hepatosplenomegaly Extremities: No clubbing, cyanosis or edema Pulses: 2+ and symmetric all extremities Skin: Skin color, texture, turgor normal, no rashes or lesions Lymph nodes: Cervical, supraclavicular, and axillary nodes normal Neurologic: CNII-XII intact, normal strength, sensation and gait; reflexes 2+ and symmetric throughout   Psych: Normal mood, affect, hygiene and grooming  ASSESSMENT/PLAN: No diagnosis found.    Discussed PSA screening (risks/benefits),  recommended at least 30 minutes of aerobic activity at least 5 days/week; proper sunscreen use reviewed; healthy diet and alcohol recommendations (less than or equal to 2 drinks/day) reviewed; regular seatbelt use; changing batteries in smoke detectors. Immunization recommendations discussed.  Colonoscopy recommendations reviewed.   Medicare Attestation I have personally reviewed: The patient's medical and social history Their use of alcohol, tobacco or illicit drugs Their current medications and supplements The patient's functional ability including ADLs,fall risks, home safety risks, cognitive, and hearing and visual impairment Diet and physical activities Evidence for depression or mood disorders  The patient's weight, height, and BMI have been recorded in the chart.  I have made referrals, counseling, and provided education to the patient based on review of the above and I have provided the patient with a written personalized care plan for preventive services.     Donell Beers, FNP   09/09/2022

## 2022-09-10 ENCOUNTER — Other Ambulatory Visit (HOSPITAL_COMMUNITY): Payer: Self-pay

## 2022-09-10 ENCOUNTER — Telehealth: Payer: Self-pay

## 2022-09-10 MED ORDER — GABAPENTIN 300 MG PO CAPS
300.0000 mg | ORAL_CAPSULE | Freq: Two times a day (BID) | ORAL | 3 refills | Status: DC
  Filled 2022-09-10: qty 180, 90d supply, fill #0

## 2022-09-10 MED ORDER — GLIPIZIDE 10 MG PO TABS
10.0000 mg | ORAL_TABLET | Freq: Two times a day (BID) | ORAL | 3 refills | Status: DC
  Filled 2022-09-10: qty 180, 90d supply, fill #0

## 2022-09-10 MED ORDER — TRAMADOL HCL 50 MG PO TABS
50.0000 mg | ORAL_TABLET | Freq: Two times a day (BID) | ORAL | 1 refills | Status: DC
  Filled 2022-09-10: qty 20, 10d supply, fill #0

## 2022-09-10 MED ORDER — LOSARTAN POTASSIUM 50 MG PO TABS
50.0000 mg | ORAL_TABLET | Freq: Two times a day (BID) | ORAL | 3 refills | Status: DC
  Filled 2022-09-10: qty 180, 90d supply, fill #0

## 2022-09-10 MED ORDER — TAMSULOSIN HCL 0.4 MG PO CAPS
0.4000 mg | ORAL_CAPSULE | Freq: Every day | ORAL | 3 refills | Status: DC
  Filled 2022-09-10: qty 90, 90d supply, fill #0
  Filled 2023-07-01: qty 90, 90d supply, fill #1

## 2022-09-10 NOTE — Progress Notes (Signed)
   Care Guide Note  09/10/2022 Name: Jon Harper. MRN: [id number] DOB: Apr 26, 1955  Referred by: Donell Beers, FNP Reason for referral : Care Coordination (Outreach to reschedule with Pharm d )   Jon Harper. is a 67 y.o. year old male who is a primary care patient of Donell Beers, FNP. Jon Harper. was referred to the pharmacist for assistance related to DM.    An unsuccessful telephone outreach was attempted today to contact the patient who was referred to the pharmacy team for assistance with medication management. Additional attempts will be made to contact the patient.   Jon Harper, RMA Care Guide [facility]  [place], [place] [address] Direct Dial: [phone] [email or web address]

## 2022-09-11 ENCOUNTER — Other Ambulatory Visit: Payer: Self-pay

## 2022-09-11 ENCOUNTER — Other Ambulatory Visit (HOSPITAL_COMMUNITY): Payer: Self-pay

## 2022-09-16 NOTE — Progress Notes (Signed)
   Care Guide Note  09/16/2022 Name: Jon Harper. MRN: [id number] DOB: 1955/10/11  Referred by: Donell Beers, FNP Reason for referral : Care Coordination (Outreach to reschedule with Pharm d )   [name] is a 67 y.o. year old male who is a primary care patient of Donell Beers, FNP. [name] was referred to the pharmacist for assistance related to DM.    A second unsuccessful telephone outreach was attempted today to contact the patient who was referred to the pharmacy team for assistance with medication management. Additional attempts will be made to contact the patient.  Penne Lash, RMA Care Guide [facility]  [place], [place] [address] Direct Dial: [phone] [name]

## 2022-09-19 NOTE — Progress Notes (Signed)
Pt advised me to call back 09/23/2022

## 2022-09-24 NOTE — Progress Notes (Signed)
   Care Guide Note  09/24/2022 Name: Jon Harper. MRN: [id number] DOB: 1955-09-24  Referred by: Donell Beers, FNP Reason for referral : Care Coordination (Outreach to reschedule with Pharm d )   Jon Harper. is a 67 y.o. year old male who is a primary care patient of Donell Beers, FNP. Jon Harper. was referred to the pharmacist for assistance related to DM.    A third unsuccessful telephone outreach was attempted today to contact the patient who was referred to the pharmacy team for assistance with medication management. The Population Health team is pleased to engage with this patient at any time in the future upon receipt of referral and should he/she be interested in assistance from the [facility] team.   Jon Harper, RMA Care Guide [facility]  [place], [place] [address] Direct Dial: [phone] [email or web address]

## 2022-10-08 ENCOUNTER — Other Ambulatory Visit: Payer: Self-pay

## 2022-10-08 MED ORDER — TRAMADOL HCL 50 MG PO TABS
50.0000 mg | ORAL_TABLET | Freq: Two times a day (BID) | ORAL | 1 refills | Status: DC | PRN
  Filled 2022-10-08: qty 30, 15d supply, fill #0

## 2022-10-08 MED ORDER — ATORVASTATIN CALCIUM 40 MG PO TABS
40.0000 mg | ORAL_TABLET | Freq: Every evening | ORAL | 3 refills | Status: DC
  Filled 2022-10-08: qty 90, 90d supply, fill #0

## 2022-10-08 MED ORDER — METOPROLOL TARTRATE 25 MG PO TABS
25.0000 mg | ORAL_TABLET | Freq: Two times a day (BID) | ORAL | 3 refills | Status: DC
  Filled 2022-10-08: qty 180, 90d supply, fill #0

## 2022-10-08 MED ORDER — ASPIRIN 81 MG PO CHEW
81.0000 mg | CHEWABLE_TABLET | Freq: Every day | ORAL | 3 refills | Status: DC
  Filled 2022-10-08: qty 90, 90d supply, fill #0

## 2022-10-08 MED ORDER — GLIPIZIDE 10 MG PO TABS
10.0000 mg | ORAL_TABLET | Freq: Two times a day (BID) | ORAL | 3 refills | Status: DC
  Filled 2022-10-08 – 2023-04-14 (×2): qty 180, 90d supply, fill #0
  Filled 2023-06-09 – 2023-07-21 (×3): qty 180, 90d supply, fill #1
  Filled 2023-09-16 – 2023-09-29 (×2): qty 180, 90d supply, fill #2

## 2022-10-08 MED ORDER — HYDROCHLOROTHIAZIDE 12.5 MG PO TABS
12.5000 mg | ORAL_TABLET | Freq: Every day | ORAL | 3 refills | Status: DC
  Filled 2022-10-08 – 2022-11-18 (×3): qty 90, 90d supply, fill #0

## 2022-10-08 MED ORDER — DULOXETINE HCL 30 MG PO CPEP
30.0000 mg | ORAL_CAPSULE | Freq: Every day | ORAL | 3 refills | Status: AC
  Filled 2022-10-08 – 2023-07-01 (×2): qty 90, 90d supply, fill #0

## 2022-10-15 ENCOUNTER — Other Ambulatory Visit: Payer: Self-pay

## 2022-11-07 ENCOUNTER — Other Ambulatory Visit: Payer: Self-pay

## 2022-11-07 MED ORDER — TRAMADOL HCL 50 MG PO TABS
50.0000 mg | ORAL_TABLET | Freq: Two times a day (BID) | ORAL | 1 refills | Status: AC | PRN
Start: 2022-11-06 — End: ?
  Filled 2022-11-07: qty 30, 15d supply, fill #0
  Filled 2022-12-08: qty 30, 15d supply, fill #1

## 2022-11-14 ENCOUNTER — Other Ambulatory Visit: Payer: Self-pay

## 2022-11-18 ENCOUNTER — Other Ambulatory Visit: Payer: Self-pay

## 2022-11-19 ENCOUNTER — Other Ambulatory Visit: Payer: Self-pay

## 2022-11-22 ENCOUNTER — Other Ambulatory Visit: Payer: Self-pay

## 2022-11-25 ENCOUNTER — Other Ambulatory Visit: Payer: Self-pay

## 2022-11-28 ENCOUNTER — Telehealth: Payer: Self-pay

## 2022-11-28 NOTE — Patient Outreach (Signed)
Care Guide Note  11/28/2022 Name: Jon Harper. MRN: [id number] DOB: 1955/07/17  Referred by: Jon Beers, FNP Reason for referral : patient outreach  (Outreach to schedule with RPH.)   Jon Lias. is a 67 y.o. year old male who is a primary care patient of Jon Beers, FNP. Jon Lias. was referred to the pharmacist for assistance related to HTN.    An unsuccessful telephone outreach was attempted today to contact the patient who was referred to the pharmacy team for assistance with HTN. Additional attempts will be made to contact the patient.   Jon Harper [facility] Assistant-Population Health [phone]

## 2022-12-08 ENCOUNTER — Other Ambulatory Visit: Payer: Self-pay | Admitting: Nurse Practitioner

## 2022-12-08 DIAGNOSIS — G8929 Other chronic pain: Secondary | ICD-10-CM

## 2022-12-09 ENCOUNTER — Other Ambulatory Visit: Payer: Self-pay | Admitting: Nurse Practitioner

## 2022-12-09 ENCOUNTER — Other Ambulatory Visit: Payer: Self-pay

## 2022-12-09 MED ORDER — GABAPENTIN 300 MG PO CAPS
300.0000 mg | ORAL_CAPSULE | Freq: Two times a day (BID) | ORAL | 0 refills | Status: DC
  Filled 2022-12-09: qty 180, 90d supply, fill #0

## 2022-12-22 DIAGNOSIS — S39012A Strain of muscle, fascia and tendon of lower back, initial encounter: Secondary | ICD-10-CM | POA: Diagnosis not present

## 2022-12-22 DIAGNOSIS — S161XXA Strain of muscle, fascia and tendon at neck level, initial encounter: Secondary | ICD-10-CM | POA: Diagnosis not present

## 2022-12-22 DIAGNOSIS — M549 Dorsalgia, unspecified: Secondary | ICD-10-CM | POA: Diagnosis not present

## 2023-01-08 ENCOUNTER — Emergency Department (HOSPITAL_BASED_OUTPATIENT_CLINIC_OR_DEPARTMENT_OTHER)
Admission: EM | Admit: 2023-01-08 | Discharge: 2023-01-08 | Discharge status: Against Medical Advice | Payer: Medicare (Managed Care) | Attending: Emergency Medicine | Admitting: Emergency Medicine

## 2023-01-08 ENCOUNTER — Other Ambulatory Visit: Payer: Self-pay

## 2023-01-08 ENCOUNTER — Encounter (HOSPITAL_BASED_OUTPATIENT_CLINIC_OR_DEPARTMENT_OTHER): Payer: Self-pay

## 2023-01-08 DIAGNOSIS — K0889 Other specified disorders of teeth and supporting structures: Secondary | ICD-10-CM | POA: Insufficient documentation

## 2023-01-08 DIAGNOSIS — Z5321 Procedure and treatment not carried out due to patient leaving prior to being seen by health care provider: Secondary | ICD-10-CM | POA: Insufficient documentation

## 2023-01-08 MED ORDER — PENICILLIN V POTASSIUM 500 MG PO TABS
500.0000 mg | ORAL_TABLET | Freq: Two times a day (BID) | ORAL | 0 refills | Status: DC
  Filled 2023-01-08: qty 14, 7d supply, fill #0

## 2023-01-08 MED ORDER — OXYCODONE-ACETAMINOPHEN 5-325 MG PO TABS
1.0000 | ORAL_TABLET | ORAL | Status: DC | PRN
  Administered 2023-01-08: 1 via ORAL
  Filled 2023-01-08: qty 1

## 2023-01-08 NOTE — ED Triage Notes (Signed)
Pt reporting severe dental pain last couple weeks, can't be seen by dentist soon enough.

## 2023-01-25 ENCOUNTER — Other Ambulatory Visit: Payer: Self-pay

## 2023-01-25 ENCOUNTER — Emergency Department (HOSPITAL_BASED_OUTPATIENT_CLINIC_OR_DEPARTMENT_OTHER): Payer: Medicare (Managed Care)

## 2023-01-25 ENCOUNTER — Observation Stay (HOSPITAL_BASED_OUTPATIENT_CLINIC_OR_DEPARTMENT_OTHER)
Admission: EM | Admit: 2023-01-25 | Discharge: 2023-01-27 | Disposition: A | Discharge status: Home / Self Care | Payer: Medicare (Managed Care) | Attending: Emergency Medicine | Admitting: Emergency Medicine

## 2023-01-25 DIAGNOSIS — E1140 Type 2 diabetes mellitus with diabetic neuropathy, unspecified: Secondary | ICD-10-CM | POA: Insufficient documentation

## 2023-01-25 DIAGNOSIS — N182 Chronic kidney disease, stage 2 (mild): Secondary | ICD-10-CM | POA: Diagnosis present

## 2023-01-25 DIAGNOSIS — G8929 Other chronic pain: Secondary | ICD-10-CM | POA: Insufficient documentation

## 2023-01-25 DIAGNOSIS — R109 Unspecified abdominal pain: Secondary | ICD-10-CM | POA: Diagnosis present

## 2023-01-25 DIAGNOSIS — Z905 Acquired absence of kidney: Secondary | ICD-10-CM

## 2023-01-25 DIAGNOSIS — N179 Acute kidney failure, unspecified: Secondary | ICD-10-CM | POA: Diagnosis not present

## 2023-01-25 DIAGNOSIS — M79602 Pain in left arm: Secondary | ICD-10-CM | POA: Insufficient documentation

## 2023-01-25 DIAGNOSIS — E66811 Obesity, class 1: Secondary | ICD-10-CM | POA: Insufficient documentation

## 2023-01-25 DIAGNOSIS — K5732 Diverticulitis of large intestine without perforation or abscess without bleeding: Secondary | ICD-10-CM | POA: Diagnosis present

## 2023-01-25 DIAGNOSIS — K5900 Constipation, unspecified: Secondary | ICD-10-CM | POA: Diagnosis not present

## 2023-01-25 DIAGNOSIS — Z7982 Long term (current) use of aspirin: Secondary | ICD-10-CM | POA: Insufficient documentation

## 2023-01-25 DIAGNOSIS — Z85520 Personal history of malignant carcinoid tumor of kidney: Secondary | ICD-10-CM | POA: Diagnosis not present

## 2023-01-25 DIAGNOSIS — N400 Enlarged prostate without lower urinary tract symptoms: Secondary | ICD-10-CM | POA: Diagnosis not present

## 2023-01-25 DIAGNOSIS — I10 Essential (primary) hypertension: Secondary | ICD-10-CM | POA: Diagnosis present

## 2023-01-25 DIAGNOSIS — E119 Type 2 diabetes mellitus without complications: Secondary | ICD-10-CM

## 2023-01-25 DIAGNOSIS — I129 Hypertensive chronic kidney disease with stage 1 through stage 4 chronic kidney disease, or unspecified chronic kidney disease: Secondary | ICD-10-CM | POA: Diagnosis not present

## 2023-01-25 DIAGNOSIS — Z85528 Personal history of other malignant neoplasm of kidney: Secondary | ICD-10-CM

## 2023-01-25 DIAGNOSIS — G629 Polyneuropathy, unspecified: Secondary | ICD-10-CM

## 2023-01-25 DIAGNOSIS — K920 Hematemesis: Secondary | ICD-10-CM | POA: Insufficient documentation

## 2023-01-25 DIAGNOSIS — Z6833 Body mass index (BMI) 33.0-33.9, adult: Secondary | ICD-10-CM | POA: Insufficient documentation

## 2023-01-25 DIAGNOSIS — K5792 Diverticulitis of intestine, part unspecified, without perforation or abscess without bleeding: Principal | ICD-10-CM

## 2023-01-25 DIAGNOSIS — K8020 Calculus of gallbladder without cholecystitis without obstruction: Secondary | ICD-10-CM | POA: Diagnosis not present

## 2023-01-25 DIAGNOSIS — E1122 Type 2 diabetes mellitus with diabetic chronic kidney disease: Secondary | ICD-10-CM | POA: Diagnosis not present

## 2023-01-25 DIAGNOSIS — E875 Hyperkalemia: Secondary | ICD-10-CM | POA: Diagnosis present

## 2023-01-25 DIAGNOSIS — K5733 Diverticulitis of large intestine without perforation or abscess with bleeding: Principal | ICD-10-CM | POA: Insufficient documentation

## 2023-01-25 LAB — COMPREHENSIVE METABOLIC PANEL
ALT: 33 U/L (ref 0–44)
ALT: 32 U/L (ref 0–44)
AST: 36 U/L (ref 15–41)
AST: 18 U/L (ref 15–41)
Albumin: 4.2 g/dL (ref 3.5–5.0)
Albumin: 3.7 g/dL (ref 3.5–5.0)
Alkaline Phosphatase: 56 U/L (ref 38–126)
Alkaline Phosphatase: 59 U/L (ref 38–126)
Anion gap: 9 (ref 5–15)
Anion gap: 10 (ref 5–15)
BUN: 28 mg/dL — ABNORMAL HIGH (ref 8–23)
BUN: 28 mg/dL — ABNORMAL HIGH (ref 8–23)
CO2: 26 mmol/L (ref 22–32)
CO2: 25 mmol/L (ref 22–32)
Calcium: 9.8 mg/dL (ref 8.9–10.3)
Calcium: 9.5 mg/dL (ref 8.9–10.3)
Chloride: 103 mmol/L (ref 98–111)
Chloride: 102 mmol/L (ref 98–111)
Creatinine, Ser: 1.61 mg/dL — ABNORMAL HIGH (ref 0.61–1.24)
Creatinine, Ser: 1.6 mg/dL — ABNORMAL HIGH (ref 0.61–1.24)
GFR, Estimated: 47 mL/min — ABNORMAL LOW (ref >60)
GFR, Estimated: 48 mL/min — ABNORMAL LOW (ref >60)
Glucose, Bld: 171 mg/dL — ABNORMAL HIGH (ref 70–99)
Glucose, Bld: 169 mg/dL — ABNORMAL HIGH (ref 70–99)
Potassium: 5.3 mmol/L — ABNORMAL HIGH (ref 3.5–5.1)
Potassium: 3.8 mmol/L (ref 3.5–5.1)
Sodium: 138 mmol/L (ref 135–145)
Sodium: 137 mmol/L (ref 135–145)
Total Bilirubin: 0.6 mg/dL (ref <1.2)
Total Bilirubin: 0.7 mg/dL (ref <1.2)
Total Protein: 8.3 g/dL — ABNORMAL HIGH (ref 6.5–8.1)
Total Protein: 7.9 g/dL (ref 6.5–8.1)

## 2023-01-25 LAB — CBC WITH DIFFERENTIAL/PLATELET
Abs Immature Granulocytes: 0.04 10*3/uL (ref 0.00–0.07)
Basophils Absolute: 0 10*3/uL (ref 0.0–0.1)
Basophils Relative: 0 %
Eosinophils Absolute: 0.2 10*3/uL (ref 0.0–0.5)
Eosinophils Relative: 2 %
HCT: 39.6 % (ref 39.0–52.0)
Hemoglobin: 13.5 g/dL (ref 13.0–17.0)
Immature Granulocytes: 0 %
Lymphocytes Relative: 22 %
Lymphs Abs: 2.2 10*3/uL (ref 0.7–4.0)
MCH: 27.7 pg (ref 26.0–34.0)
MCHC: 34.1 g/dL (ref 30.0–36.0)
MCV: 81.3 fL (ref 80.0–100.0)
Monocytes Absolute: 0.7 10*3/uL (ref 0.1–1.0)
Monocytes Relative: 7 %
Neutro Abs: 6.8 10*3/uL (ref 1.7–7.7)
Neutrophils Relative %: 69 %
Platelets: 197 10*3/uL (ref 150–400)
RBC: 4.87 MIL/uL (ref 4.22–5.81)
RDW: 14.4 % (ref 11.5–15.5)
WBC: 10.1 10*3/uL (ref 4.0–10.5)
nRBC: 0 % (ref 0.0–0.2)

## 2023-01-25 LAB — HIV ANTIBODY (ROUTINE TESTING W REFLEX): HIV Screen 4th Generation wRfx: NONREACTIVE

## 2023-01-25 LAB — LIPASE, BLOOD: Lipase: 112 U/L — ABNORMAL HIGH (ref 11–51)

## 2023-01-25 LAB — GLUCOSE, CAPILLARY
Glucose-Capillary: 146 mg/dL — ABNORMAL HIGH (ref 70–99)
Glucose-Capillary: 189 mg/dL — ABNORMAL HIGH (ref 70–99)

## 2023-01-25 MED ORDER — SODIUM CHLORIDE 0.9 % IV SOLN
3.0000 g | Freq: Four times a day (QID) | INTRAVENOUS | Status: DC
  Administered 2023-01-26 – 2023-01-27 (×6): 3 g via INTRAVENOUS
  Filled 2023-01-25 (×7): qty 8

## 2023-01-25 MED ORDER — PIPERACILLIN-TAZOBACTAM 3.375 G IVPB 30 MIN
3.3750 g | Freq: Once | INTRAVENOUS | Status: AC
  Administered 2023-01-25: 3.375 g via INTRAVENOUS
  Filled 2023-01-25: qty 50

## 2023-01-25 MED ORDER — GABAPENTIN 100 MG PO CAPS
300.0000 mg | ORAL_CAPSULE | Freq: Two times a day (BID) | ORAL | Status: DC
  Administered 2023-01-25 – 2023-01-27 (×4): 300 mg via ORAL
  Filled 2023-01-25 (×4): qty 3

## 2023-01-25 MED ORDER — ONDANSETRON HCL 4 MG PO TABS: 4.0000 mg | ORAL_TABLET | Freq: Four times a day (QID) | ORAL | Status: DC | PRN

## 2023-01-25 MED ORDER — ONDANSETRON HCL 4 MG/2ML IJ SOLN
4.0000 mg | Freq: Once | INTRAMUSCULAR | Status: AC
  Administered 2023-01-25: 4 mg via INTRAVENOUS
  Filled 2023-01-25: qty 2

## 2023-01-25 MED ORDER — ONDANSETRON HCL 4 MG/2ML IJ SOLN: 4.0000 mg | Freq: Four times a day (QID) | INTRAMUSCULAR | Status: DC | PRN

## 2023-01-25 MED ORDER — SODIUM CHLORIDE 0.9 % IV BOLUS
500.0000 mL | Freq: Once | INTRAVENOUS | Status: AC
  Administered 2023-01-25: 500 mL via INTRAVENOUS

## 2023-01-25 MED ORDER — SODIUM CHLORIDE 0.9 % IV SOLN
INTRAVENOUS | Status: DC
Start: 2023-01-25 — End: 2023-01-26

## 2023-01-25 MED ORDER — INSULIN ASPART 100 UNIT/ML IJ SOLN
0.0000 [IU] | INTRAMUSCULAR | Status: DC
  Administered 2023-01-25: 1 [IU] via SUBCUTANEOUS
  Administered 2023-01-25: 2 [IU] via SUBCUTANEOUS
  Administered 2023-01-26: 1 [IU] via SUBCUTANEOUS

## 2023-01-25 MED ORDER — PANTOPRAZOLE SODIUM 40 MG IV SOLR
40.0000 mg | Freq: Two times a day (BID) | INTRAVENOUS | Status: DC
  Administered 2023-01-25 – 2023-01-27 (×4): 40 mg via INTRAVENOUS
  Filled 2023-01-25 (×4): qty 10

## 2023-01-25 MED ORDER — ACETAMINOPHEN 650 MG RE SUPP: 650.0000 mg | Freq: Four times a day (QID) | RECTAL | Status: DC | PRN

## 2023-01-25 MED ORDER — HEPARIN SODIUM (PORCINE) 5000 UNIT/ML IJ SOLN: 5000.0000 [IU] | Freq: Three times a day (TID) | INTRAMUSCULAR | Status: DC

## 2023-01-25 MED ORDER — POLYETHYLENE GLYCOL 3350 17 G PO PACK: 17.0000 g | PACK | Freq: Every day | ORAL | Status: DC | PRN

## 2023-01-25 MED ORDER — IOHEXOL 350 MG/ML SOLN
80.0000 mL | Freq: Once | INTRAVENOUS | Status: AC | PRN
  Administered 2023-01-25: 80 mL via INTRAVENOUS

## 2023-01-25 MED ORDER — TAMSULOSIN HCL 0.4 MG PO CAPS
0.4000 mg | ORAL_CAPSULE | Freq: Every day | ORAL | Status: DC
  Administered 2023-01-26 – 2023-01-27 (×2): 0.4 mg via ORAL
  Filled 2023-01-25 (×2): qty 1

## 2023-01-25 MED ORDER — DULOXETINE HCL 60 MG PO CPEP: 60.0000 mg | ORAL_CAPSULE | Freq: Every day | ORAL | Status: DC

## 2023-01-25 MED ORDER — OXYCODONE HCL 5 MG PO TABS
5.0000 mg | ORAL_TABLET | ORAL | Status: DC | PRN
  Administered 2023-01-25 – 2023-01-27 (×7): 5 mg via ORAL
  Filled 2023-01-25 (×8): qty 1

## 2023-01-25 MED ORDER — HYDROMORPHONE HCL 1 MG/ML IJ SOLN
0.5000 mg | INTRAMUSCULAR | Status: DC | PRN
  Administered 2023-01-25: 0.5 mg via INTRAVENOUS
  Filled 2023-01-25: qty 0.5

## 2023-01-25 MED ORDER — ACETAMINOPHEN 325 MG PO TABS
650.0000 mg | ORAL_TABLET | Freq: Four times a day (QID) | ORAL | Status: DC | PRN
  Filled 2023-01-25: qty 2

## 2023-01-25 MED ORDER — ASPIRIN 81 MG PO TBEC: 81.0000 mg | DELAYED_RELEASE_TABLET | Freq: Every day | ORAL | Status: DC

## 2023-01-25 MED ORDER — DULOXETINE HCL 60 MG PO CPEP
60.0000 mg | ORAL_CAPSULE | Freq: Every day | ORAL | Status: DC
  Administered 2023-01-26 – 2023-01-27 (×2): 60 mg via ORAL
  Filled 2023-01-25 (×2): qty 1

## 2023-01-25 MED ORDER — HYDROMORPHONE HCL 1 MG/ML IJ SOLN
1.0000 mg | Freq: Once | INTRAMUSCULAR | Status: AC
  Administered 2023-01-25: 1 mg via INTRAVENOUS
  Filled 2023-01-25: qty 1

## 2023-01-25 MED ORDER — METOPROLOL TARTRATE 25 MG PO TABS
25.0000 mg | ORAL_TABLET | Freq: Two times a day (BID) | ORAL | Status: DC
  Administered 2023-01-25 – 2023-01-27 (×4): 25 mg via ORAL
  Filled 2023-01-25 (×4): qty 1

## 2023-01-25 MED ORDER — SUCRALFATE 1 GM/10ML PO SUSP
1.0000 g | Freq: Three times a day (TID) | ORAL | Status: DC
  Administered 2023-01-25 – 2023-01-27 (×7): 1 g via ORAL
  Filled 2023-01-25 (×8): qty 10

## 2023-01-25 MED ORDER — GABAPENTIN 100 MG PO CAPS: 300.0000 mg | ORAL_CAPSULE | Freq: Two times a day (BID) | ORAL | Status: DC

## 2023-01-25 NOTE — H&P (Signed)
History and Physical  Patient: Jon Harper. ZOX:[id number] DOB: [birth year] DOA: 01/25/2023 DOS: the patient was seen and examined on 01/25/2023 Patient coming from: Home  Chief Complaint: Abdominal pain and vomiting of blood ED TRIAGE note : "No notes on file " HPI: Jon Beachler. is a 67 y.o. male with PMH significant of type II DM, neuropathy, BPH, HTN, chronic pain present to the hospital with complaints of abdominal pain and vomiting of blood. Reports that for last 3 days he has been having some abdominal pain which is diffuse in nature, continues with intermittent worsening.  He has been passing gas but no BM since last 2 days. He denies any fever or chills. When he started having vomiting on 11/15.  He reports to vomiting episode both with bright red blood without any clots.  He denies any vomiting prior to this. He denies any vomiting today. No nausea at the time of my evaluation. He is passing yesterday as well. Denies any difficulty urination or bleeding anywhere else. Denies taking any blood thinner. Takes a full dose aspirin on a daily basis. Denies any cigarette smoking.  Occasional marijuana use reported. Denies any alcohol abuse. Denies any prior similar episode of vomiting of blood. Reports that he had some abdominal pain similar symptoms a few years ago but denies any prior history of diverticulitis. Does not remember if he had a colonoscopy or not. He is compliant with all his medications.  Review of Systems: As mentioned in the history of present illness. All other systems reviewed and are negative.  Prior to Admission medications   Medication Sig Start Date End Date Taking? Authorizing Provider  aspirin 325 MG tablet Take 325 mg by mouth daily.   Yes [provider]  acetaminophen (TYLENOL) 325 MG tablet Take 2 tablets (650 mg total) by mouth every 6 (six) hours as needed. 08/26/22   Renne Crigler, PA-C  Blood Pressure KIT 1 each by Does not apply  route once a week. 07/30/22   Donell Beers, FNP  DULoxetine (CYMBALTA) 30 MG capsule Take 1 capsule (30 mg total) by mouth daily. Patient taking differently: Take 60 mg by mouth daily. 10/08/22     empagliflozin (JARDIANCE) 25 MG TABS tablet Take 1 tablet (25 mg total) by mouth daily before breakfast. 07/30/22   Paseda, Baird Kay, FNP  gabapentin (NEURONTIN) 300 MG capsule Take 1 capsule (300 mg total) by mouth 2 (two) times daily. 12/09/22   Paseda, Baird Kay, FNP  glipiZIDE (GLUCOTROL) 10 MG tablet Take 1 tablet (10 mg total) by mouth 2 (two) times daily before a meal. 10/08/22     glucose blood test strip Use as instructed to check blood sugars twice a day. 07/30/22   Paseda, Baird Kay, FNP  hydrochlorothiazide (HYDRODIURIL) 12.5 MG tablet Take 1 tablet (12.5 mg total) by mouth daily. 10/08/22     hydrocortisone cream 0.5 % Apply 1 application topically 2 (two) times daily. 10/26/18   Kallie Locks, FNP  Lancets [facility] DELICA PLUS [place]) MISC 1 each by Does not apply route in the morning and at bedtime. 07/30/22   Paseda, Baird Kay, FNP  losartan (COZAAR) 50 MG tablet Take 2 tablets (100 mg total) by mouth daily. 07/30/22   Paseda, Baird Kay, FNP  losartan (COZAAR) 50 MG tablet Take 1 tablet (50 mg total) by mouth 2 (two) times daily for high blood pressure 09/10/22     metoprolol tartrate (LOPRESSOR) 25 MG tablet Take 1 tablet (25  mg total) by mouth 2 (two) times daily. 07/30/22 02/23/23  Donell Beers, FNP  metoprolol tartrate (LOPRESSOR) 25 MG tablet Take 1 tablet (25 mg total) by mouth 2 (two) times daily. 10/08/22     naproxen (NAPROSYN) 500 MG tablet Take 1 tablet (500 mg total) by mouth 2 (two) times daily with a meal. As needed for pain 06/13/22   Linwood Dibbles, MD  penicillin v potassium (VEETID) 500 MG tablet Take 1 tablet by mouth in the morning and 1 tablet by mouth in the evening for 7 days for tooth infection 01/08/23     tamsulosin (FLOMAX) 0.4 MG CAPS capsule Take 1  capsule (0.4 mg total) by mouth daily for BPH 09/10/22     traMADol (ULTRAM) 50 MG tablet Take 1 tablet (50 mg total) by mouth 2 (two) times daily for breakthrough pain 09/10/22     traMADol (ULTRAM) 50 MG tablet Take 1 tablet (50 mg total) by mouth 2 (two) times daily as needed for breakthrough pain. 10/08/22     traMADol (ULTRAM) 50 MG tablet Take 1 tablet (50 mg total) by mouth 2 (two) times daily as needed for breakthrough pain. 11/06/22       Past Medical History:  Diagnosis Date   Chronic low back pain    Diabetes mellitus    Hyperlipidemia    Hypertension    Neck pain    Neck pain    PAC (premature atrial contraction)    Skin rash 10/2018   Tooth abscess 10/2018   Past Surgical History:  Procedure Laterality Date   right nephrectomy     Social History:  reports that he has never smoked. He has never used smokeless tobacco. He reports current drug use. Drug: Marijuana. He reports that he does not drink alcohol. No Known Allergies Family History  Problem Relation Age of Onset   Diabetes Mother        non-insulin    Diabetes Father        non-insulin    Diabetes Sister        non-insulin    Physical Exam: Vitals:   01/25/23 1430 01/25/23 1500 01/25/23 1530 01/25/23 1651  BP: (!) 129/95 135/79 121/71 133/88  Pulse: 78 90 86 71  Resp:   18 18  Temp: 98.9 F (37.2 C)   99.3 F (37.4 C)  TempSrc: Oral   Oral  SpO2: 100% 100% 97% 100%   General: Appear in mild distress; no visible Abnormal Neck Mass Or lumps, Conjunctiva normal Cardiovascular: S1 and S2 Present, no Murmur, Respiratory: good respiratory effort, Bilateral Air entry present and CTA, no Crackles, no wheezes Abdomen: Bowel Sound present, mild diffuse tenderness, no distention Extremities: no Pedal edema Neurology: alert and oriented to time, place, and person Gait not checked due to patient safety concerns   Data Reviewed: I have reviewed ED notes, Vitals, Lab results and outpatient records. Since last  encounter, pertinent lab results CBC and BMP   . I have ordered test including CBC and BMP  .   Assessment and Plan Sigmoid diverticulitis Mild acute. No perforation seen no abscess. Received IV Zosyn at ER.  Will initiate IV Unasyn. Continue with IV hydration. Since his symptoms are already improving we will initiate on clear liquid diet and if tolerates it well then advance to full liquid diet tomorrow.  Hematemesis. Patient reports 2 episodes of vomiting of blood on 11/15. So far no bleeding reported by the patient today. Hemoglobin at 13.5 appears to  be adequate in nature. Will recheck tomorrow. At present avoiding chemical DVT prophylaxis and holding his aspirin. Adding PPI twice daily as well as Carafate. If has reoccurrence of the symptoms or has further anemia will discuss with GI for further assistance.  Acute kidney injury superimposed on stage 2 chronic kidney disease (HCC) Hyperkalemia Baseline serum creatinine around 1.3. On admission serum creatinine 1.61. BUN elevated at 28. Potassium 5.3. Currently potassium correcting. Will provide IV hydration. Monitor renal function. Hold HCTZ and losartan.  Type 2 diabetes mellitus without complication, without long-term current use of insulin (HCC) Hemoglobin A1c 9.8 in May 24. Patient is concerned that since he is diabetic he needs to eat something. Reassured that he will remain on clear liquid diet and we will be monitoring his CBGs every 4 hours with sliding scale insulin. Holding his Jardiance and glipizide.  Hx of renal cell cancer History of unilateral nephrectomy Noted.  Monitor for now.  Hypertension Blood pressure is actually stable but Continuing metoprolol. Holding rest of the medication.  Neuropathy On Cymbalta and gabapentin which I will continue.  BPH. Continue Flomax.  Chronic pain. On tramadol chronically. Currently on oxycodone and Dilaudid.  (Dilaudid selected secondary to renal  dysfunction).  Constipation. Stool burden seen on CT scan.  MiraLAX as needed.   Advance Care Planning:   Code Status: Full Code  Consults: none Family Communication: none  Author: Lynden Oxford, MD 01/25/2023 5:42 PM For on call review [email or web address].

## 2023-01-25 NOTE — ED Provider Notes (Signed)
[place] EMERGENCY DEPARTMENT AT [facility] Provider Note   CSN: [id number] Arrival date & time: 01/25/23  1035     History  Chief Complaint  Patient presents with   Abdominal Pain    Jon Harper. is a 67 y.o. male.   Abdominal Pain Patient with abdominal pain for the last few days.  Has had some nausea and vomiting.  States he vomited up some blood.  Also has had difficulty having bowel movements.  States he not had a bowel movement 3 days.  States he has passed a little of white mucus.  Pain is diffuse.  No fevers.  No cough.  Not on blood thinners.     Home Medications Prior to Admission medications   Medication Sig Start Date End Date Taking? Authorizing Provider  acetaminophen (TYLENOL) 325 MG tablet Take 2 tablets (650 mg total) by mouth every 6 (six) hours as needed. 08/26/22   Renne Crigler, PA-C  aspirin (GOODSENSE ASPIRIN) 81 MG chewable tablet Chew 1 tablet (81 mg total) by mouth daily. 10/08/22     atorvastatin (LIPITOR) 40 MG tablet Take 1 tablet (40 mg total) by mouth daily. 07/30/22   Donell Beers, FNP  atorvastatin (LIPITOR) 40 MG tablet Take 1 tablet (40 mg total) by mouth at bedtime. 10/08/22     Blood Pressure KIT 1 each by Does not apply route once a week. 07/30/22   Paseda, Baird Kay, FNP  cyclobenzaprine (FLEXERIL) 10 MG tablet Take 1 tablet (10 mg total) by mouth 2 (two) times daily as needed for muscle spasms. 07/30/22   Paseda, Baird Kay, FNP  DULoxetine (CYMBALTA) 30 MG capsule Take 1 capsule (30 mg total) by mouth daily for 7 days, THEN 2 capsules (60 mg total) daily. 07/30/22 07/29/23  Donell Beers, FNP  DULoxetine (CYMBALTA) 30 MG capsule Take 1 capsule (30 mg total) by mouth daily. 10/08/22     empagliflozin (JARDIANCE) 25 MG TABS tablet Take 1 tablet (25 mg total) by mouth daily before breakfast. 07/30/22   Paseda, Baird Kay, FNP  gabapentin (NEURONTIN) 300 MG capsule Take 1 capsule (300 mg total) by mouth 2 (two) times  daily. 12/09/22   Paseda, Baird Kay, FNP  glipiZIDE (GLUCOTROL) 10 MG tablet TAKE 1 TABLET (10 MG TOTAL) BY MOUTH 2 (TWO) TIMES DAILY BEFORE A MEAL. 03/06/22   Paseda, Baird Kay, FNP  glipiZIDE (GLUCOTROL) 10 MG tablet Take 1 tablet (10 mg total) by mouth 2 (two) times daily before meals for diabetes 09/10/22     glipiZIDE (GLUCOTROL) 10 MG tablet Take 1 tablet (10 mg total) by mouth 2 (two) times daily before a meal. 10/08/22     glucose blood test strip Use as instructed to check blood sugars twice a day. 07/30/22   Paseda, Baird Kay, FNP  hydrochlorothiazide (HYDRODIURIL) 12.5 MG tablet Take 1 tablet (12.5 mg total) by mouth daily. 10/08/22     hydrocortisone cream 0.5 % Apply 1 application topically 2 (two) times daily. Patient not taking: Reported on 09/06/2021 10/26/18   Kallie Locks, FNP  Lancets [facility] DELICA PLUS [place]) MISC 1 each by Does not apply route in the morning and at bedtime. 07/30/22   Paseda, Baird Kay, FNP  losartan (COZAAR) 50 MG tablet Take 2 tablets (100 mg total) by mouth daily. 07/30/22   Paseda, Baird Kay, FNP  losartan (COZAAR) 50 MG tablet Take 1 tablet (50 mg total) by mouth 2 (two) times daily for high blood pressure 09/10/22  meclizine (ANTIVERT) 25 MG tablet Take 1 tablet (25 mg total) by mouth 3 (three) times daily as needed for dizziness. 09/06/21   Ivonne Andrew, NP  methocarbamol (ROBAXIN) 500 MG tablet Take 2 tablets (1,000 mg total) by mouth 4 (four) times daily. 08/26/22   Renne Crigler, PA-C  metoprolol tartrate (LOPRESSOR) 25 MG tablet Take 1 tablet (25 mg total) by mouth 2 (two) times daily. 07/30/22 02/23/23  Donell Beers, FNP  metoprolol tartrate (LOPRESSOR) 25 MG tablet Take 1 tablet (25 mg total) by mouth 2 (two) times daily. 10/08/22     naproxen (NAPROSYN) 500 MG tablet Take 1 tablet (500 mg total) by mouth 2 (two) times daily with a meal. As needed for pain 06/13/22   Linwood Dibbles, MD  penicillin v potassium (VEETID) 500 MG tablet  Take 1 tablet by mouth in the morning and 1 tablet by mouth in the evening for 7 days for tooth infection 01/08/23     tamsulosin (FLOMAX) 0.4 MG CAPS capsule Take 1 capsule (0.4 mg total) by mouth daily for BPH 09/10/22     traMADol (ULTRAM) 50 MG tablet Take 1 tablet (50 mg total) by mouth 2 (two) times daily for breakthrough pain 09/10/22     traMADol (ULTRAM) 50 MG tablet Take 1 tablet (50 mg total) by mouth 2 (two) times daily as needed for breakthrough pain. 10/08/22     traMADol (ULTRAM) 50 MG tablet Take 1 tablet (50 mg total) by mouth 2 (two) times daily as needed for breakthrough pain. 11/06/22         Allergies    Patient has no known allergies.    Review of Systems   Review of Systems  Gastrointestinal:  Positive for abdominal pain.    Physical Exam Updated Vital Signs BP 128/87 (BP Location: Right Arm)   Pulse 88   Temp (!) 97.2 F (36.2 C)   Resp 18   SpO2 100%  Physical Exam Vitals and nursing note reviewed.  HENT:     Head: Atraumatic.  Cardiovascular:     Rate and Rhythm: Normal rate.  Abdominal:     Tenderness: There is abdominal tenderness.     Comments: Diffuse abdominal tenderness, worse in lower abdomen.  No rebound or guarding.  No hernia palpated.  Neurological:     Mental Status: He is alert.     ED Results / Procedures / Treatments   Labs (all labs ordered are listed, but only abnormal results are displayed) Labs Reviewed  COMPREHENSIVE METABOLIC PANEL - Abnormal; Notable for the following components:      Result Value   Potassium 5.3 (*)    Glucose, Bld 171 (*)    BUN 28 (*)    Creatinine, Ser 1.61 (*)    Total Protein 8.3 (*)    GFR, Estimated 47 (*)    All other components within normal limits  LIPASE, BLOOD - Abnormal; Notable for the following components:   Lipase 112 (*)    All other components within normal limits  CBC WITH DIFFERENTIAL/PLATELET    EKG None  Radiology CT ABDOMEN PELVIS W CONTRAST  Result Date:  01/25/2023 CLINICAL DATA:  Acute abdominal pain. Previous right nephrectomy for renal cell carcinoma. * Tracking Code: BO * EXAM: CT ABDOMEN AND PELVIS WITH CONTRAST TECHNIQUE: Multidetector CT imaging of the abdomen and pelvis was performed using the standard protocol following bolus administration of intravenous contrast. RADIATION DOSE REDUCTION: This exam was performed according to the departmental dose-optimization program  which includes automated exposure control, adjustment of the mA and/or kV according to patient size and/or use of iterative reconstruction technique. CONTRAST:  80mL OMNIPAQUE IOHEXOL 350 MG/ML SOLN COMPARISON:  07/20/2020 FINDINGS: Lower Chest: No acute findings. Hepatobiliary: No suspicious hepatic masses identified. Gallstones are seen, however there is no evidence of cholecystitis or biliary dilatation. Pancreas:  No mass or inflammatory changes. Spleen: Within normal limits in size and appearance. Adrenals/Urinary Tract: Prior right nephrectomy again noted. No suspicious left renal masses identified. No evidence of ureteral calculi or hydronephrosis. Stomach/Bowel: Mild sigmoid diverticulitis is seen with pericolonic inflammatory changes in the sigmoid mesentery. No No evidence of perforation or abscess. Vascular/Lymphatic: No pathologically enlarged lymph nodes. No acute vascular findings. Reproductive:  Mildly enlarged prostate. Other:  None. Musculoskeletal:  No suspicious bone lesions identified. IMPRESSION: Mild uncomplicated sigmoid diverticulitis. No evidence of perforation or abscess. Prior right nephrectomy. No evidence of recurrent or metastatic carcinoma. Cholelithiasis. No radiographic evidence of cholecystitis. Electronically Signed   By: Danae Orleans M.D.   On: 01/25/2023 14:02    Procedures Procedures    Medications Ordered in ED Medications  ondansetron (ZOFRAN) injection 4 mg (has no administration in time range)  sodium chloride 0.9 % bolus 500 mL (has no  administration in time range)  piperacillin-tazobactam (ZOSYN) IVPB 3.375 g (has no administration in time range)  iohexol (OMNIPAQUE) 350 MG/ML injection 80 mL (80 mLs Intravenous Contrast Given 01/25/23 1304)    ED Course/ Medical Decision Making/ A&P                                 Medical Decision Making Amount and/or Complexity of Data Reviewed Labs: ordered. Radiology: ordered.  Risk Prescription drug management.   Patient abdominal pain.  Nausea vomiting.  Reported hematemesis.  Well-appearing.  Will get basic blood work.  Differential diagnosis includes causes such as colitis.  Will get CT scan to further evaluate.  Reviewed previous CT scans from 2 and half years ago.  White count reassuring.  Creatinine mildly elevated.  Potassium also slightly elevated.  May have a component of dehydration.  Hemoglobin is reassuring.  CT scan and shows diverticulitis.  However patient has not been able to tolerate orals.  Also creatinine elevated.  I think he would benefit from admission to the hospital for IV antibiotics.  Will discuss with hospitalist.  Does not appear septic at this time.        Final Clinical Impression(s) / ED Diagnoses Final diagnoses:  Diverticulitis    Rx / DC Orders ED Discharge Orders     None         Benjiman Core, MD 01/25/23 1441

## 2023-01-25 NOTE — Progress Notes (Signed)
Pharmacy Antibiotic Note  Jon Harper. is a 67 y.o. male admitted on 01/25/2023 with intra-abdominal infection.  Pharmacy has been consulted for Unasyn dosing.  Plan: Unasyn 3g IV q6h  Follow up renal function, culture results, and clinical course.      Temp (24hrs), Avg:98.5 F (36.9 C), Min:97.2 F (36.2 C), Max:99.3 F (37.4 C)  Recent Labs  Lab 01/25/23 1150  WBC 10.1  CREATININE 1.61*    CrCl cannot be calculated (Unknown ideal weight.).    No Known Allergies  Antimicrobials this admission: 11/16 Zosyn x1 11/16 Unasyn >>   Dose adjustments this admission: N/A  Microbiology results: none  Thank you for allowing pharmacy to be a part of this patient's care.  Lynann Beaver PharmD, BCPS [facility] main pharmacy [phone] 01/25/2023 5:04 PM

## 2023-01-25 NOTE — ED Notes (Signed)
[facility] called for transport, pt bed assignment is ready

## 2023-01-26 DIAGNOSIS — K5732 Diverticulitis of large intestine without perforation or abscess without bleeding: Secondary | ICD-10-CM | POA: Diagnosis not present

## 2023-01-26 DIAGNOSIS — K5733 Diverticulitis of large intestine without perforation or abscess with bleeding: Secondary | ICD-10-CM | POA: Diagnosis not present

## 2023-01-26 LAB — GLUCOSE, CAPILLARY
Glucose-Capillary: 101 mg/dL — ABNORMAL HIGH (ref 70–99)
Glucose-Capillary: 120 mg/dL — ABNORMAL HIGH (ref 70–99)
Glucose-Capillary: 134 mg/dL — ABNORMAL HIGH (ref 70–99)
Glucose-Capillary: 159 mg/dL — ABNORMAL HIGH (ref 70–99)
Glucose-Capillary: 169 mg/dL — ABNORMAL HIGH (ref 70–99)
Glucose-Capillary: 178 mg/dL — ABNORMAL HIGH (ref 70–99)

## 2023-01-26 LAB — CBC
HCT: 36.3 % — ABNORMAL LOW (ref 39.0–52.0)
Hemoglobin: 12.1 g/dL — ABNORMAL LOW (ref 13.0–17.0)
MCH: 27.9 pg (ref 26.0–34.0)
MCHC: 33.3 g/dL (ref 30.0–36.0)
MCV: 83.8 fL (ref 80.0–100.0)
Platelets: 163 10*3/uL (ref 150–400)
RBC: 4.33 MIL/uL (ref 4.22–5.81)
RDW: 13.9 % (ref 11.5–15.5)
WBC: 9.7 10*3/uL (ref 4.0–10.5)
nRBC: 0 % (ref 0.0–0.2)

## 2023-01-26 LAB — COMPREHENSIVE METABOLIC PANEL
ALT: 27 U/L (ref 0–44)
AST: 14 U/L — ABNORMAL LOW (ref 15–41)
Albumin: 3.3 g/dL — ABNORMAL LOW (ref 3.5–5.0)
Alkaline Phosphatase: 54 U/L (ref 38–126)
Anion gap: 8 (ref 5–15)
BUN: 22 mg/dL (ref 8–23)
CO2: 25 mmol/L (ref 22–32)
Calcium: 9.1 mg/dL (ref 8.9–10.3)
Chloride: 105 mmol/L (ref 98–111)
Creatinine, Ser: 1.44 mg/dL — ABNORMAL HIGH (ref 0.61–1.24)
GFR, Estimated: 54 mL/min — ABNORMAL LOW (ref >60)
Glucose, Bld: 120 mg/dL — ABNORMAL HIGH (ref 70–99)
Potassium: 4 mmol/L (ref 3.5–5.1)
Sodium: 138 mmol/L (ref 135–145)
Total Bilirubin: 0.6 mg/dL (ref <1.2)
Total Protein: 7.3 g/dL (ref 6.5–8.1)

## 2023-01-26 MED ORDER — SODIUM CHLORIDE 0.9 % IV SOLN: INTRAVENOUS | Status: AC

## 2023-01-26 MED ORDER — INSULIN ASPART 100 UNIT/ML IJ SOLN
0.0000 [IU] | Freq: Three times a day (TID) | INTRAMUSCULAR | Status: DC
  Administered 2023-01-26: 1 [IU] via SUBCUTANEOUS
  Administered 2023-01-26 – 2023-01-27 (×2): 2 [IU] via SUBCUTANEOUS
  Administered 2023-01-27: 1 [IU] via SUBCUTANEOUS

## 2023-01-26 MED ORDER — INSULIN ASPART 100 UNIT/ML IJ SOLN: 0.0000 [IU] | Freq: Every day | INTRAMUSCULAR | Status: DC

## 2023-01-26 NOTE — Progress Notes (Signed)
[facility] Progress Note Patient: Jon Harper. NWG:[id number] DOB: [birth year] DOA: 01/25/2023  DOS: the patient was seen and examined on 01/26/2023  Brief hospital course:  Jon Harper. is a 67 y.o. male with PMH significant of type II DM, neuropathy, BPH, HTN, chronic pain present to the hospital with complaints of abdominal pain and vomiting of blood. Reports that for last 3 days he has been having some abdominal pain which is diffuse in nature, continues with intermittent worsening.  He has been passing gas but no BM since last 2 days. He denies any fever or chills. Currently being treated for diverticulitis.  Assessment and Plan: Sigmoid diverticulitis Mild acute. No perforation seen no abscess. Received IV Zosyn at ER.  Will continue IV Unasyn. Continue with IV hydration. Monitor diet tolerance today.  If stable likely home tomorrow.   Hematemesis. Patient reports 2 episodes of vomiting of blood on 11/15. So far no bleeding reported by the patient today. Hemoglobin at 13.5 appears to be adequate in nature. At present avoiding chemical DVT prophylaxis and holding his aspirin. Adding PPI twice daily as well as Carafate. If has reoccurrence of the symptoms or has further anemia will discuss with GI for further assistance.   Acute kidney injury superimposed on stage 2 chronic kidney disease (HCC) Hyperkalemia Baseline serum creatinine around 1.3. On admission serum creatinine 1.61. BUN elevated at 28. Potassium 5.3. Corrected. For now monitor. Hold HCTZ and losartan.   Type 2 diabetes mellitus without complication, without long-term current use of insulin (HCC) Hemoglobin A1c 9.8 in May 24. Holding his Jardiance and glipizide. Switching to ACHS coverage.   Hx of renal cell cancer History of unilateral nephrectomy Noted.  Monitor for now.   Hypertension Blood pressure is actually stable but Continuing metoprolol. Holding rest of the medication.    Neuropathy On Cymbalta and gabapentin which I will continue.   BPH. Continue Flomax.   Chronic pain. On tramadol chronically. Currently on oxycodone and Dilaudid.  (Dilaudid selected secondary to renal dysfunction).   Constipation. Stool burden seen on CT scan.  MiraLAX as needed.  Subjective: No nausea no vomiting.  Minimal oral intake.  Abdominal pain still present 6 out of 10.  Physical Exam: General: in Mild distress, No Rash Cardiovascular: S1 and S2 Present, No Murmur Respiratory: Good respiratory effort, Bilateral Air entry present. No Crackles, No wheezes Abdomen: Bowel Sound present, mild right-sided tenderness Extremities: No edema Neuro: Alert and oriented x3, no new focal deficit  Data Reviewed: I have Reviewed nursing notes, Vitals, and Lab results. Since last encounter, pertinent lab results CBC and BMP   . I have ordered test including CBC and BMP  .   Disposition: Status is: Observation  Place and maintain sequential compression device Start: 01/26/23 1037   Family Communication: Monitor for improvement in oral intake Level of care: Med-Surg continue Vitals:   01/25/23 1935 01/26/23 0019 01/26/23 0406 01/26/23 0941  BP: 133/77 128/85 114/72 (!) 150/80  Pulse: 75 65 66 69  Resp: 17 16 18    Temp: 98.1 F (36.7 C) 99.1 F (37.3 C) 98.4 F (36.9 C) 98.8 F (37.1 C)  TempSrc: Oral Oral Oral Oral  SpO2: 97% 95% 95% 99%     Author: Lynden Oxford, MD 01/26/2023 7:01 PM  Please look on [email or web address] to find out who is on call.

## 2023-01-26 NOTE — Plan of Care (Signed)
  Problem: Education: Goal: Knowledge of General Education information will improve Description Including pain rating scale, medication(s)/side effects and non-pharmacologic comfort measures Outcome: Progressing   

## 2023-01-26 NOTE — Progress Notes (Signed)
   01/26/23 1501  TOC Brief Assessment  Insurance and Status Reviewed  Patient has primary care physician Yes  Home environment has been reviewed Patient will return home at discharge  Prior level of function: Independent at baseline  Prior/Current Home Services No current home services  Social Determinants of Health Reivew SDOH reviewed no interventions necessary  Readmission risk has been reviewed Yes  Transition of care needs no transition of care needs at this time

## 2023-01-27 ENCOUNTER — Observation Stay (HOSPITAL_COMMUNITY): Payer: Medicare (Managed Care)

## 2023-01-27 ENCOUNTER — Encounter (HOSPITAL_COMMUNITY): Payer: Self-pay | Admitting: Family Medicine

## 2023-01-27 ENCOUNTER — Other Ambulatory Visit: Payer: Self-pay

## 2023-01-27 DIAGNOSIS — K5732 Diverticulitis of large intestine without perforation or abscess without bleeding: Secondary | ICD-10-CM | POA: Diagnosis not present

## 2023-01-27 DIAGNOSIS — M25522 Pain in left elbow: Secondary | ICD-10-CM | POA: Diagnosis not present

## 2023-01-27 DIAGNOSIS — K5733 Diverticulitis of large intestine without perforation or abscess with bleeding: Secondary | ICD-10-CM | POA: Diagnosis not present

## 2023-01-27 DIAGNOSIS — M25422 Effusion, left elbow: Secondary | ICD-10-CM | POA: Diagnosis not present

## 2023-01-27 DIAGNOSIS — M25532 Pain in left wrist: Secondary | ICD-10-CM | POA: Diagnosis not present

## 2023-01-27 DIAGNOSIS — M1812 Unilateral primary osteoarthritis of first carpometacarpal joint, left hand: Secondary | ICD-10-CM | POA: Diagnosis not present

## 2023-01-27 LAB — C-REACTIVE PROTEIN: CRP: 7.7 mg/dL — ABNORMAL HIGH (ref <1.0)

## 2023-01-27 LAB — SEDIMENTATION RATE: Sed Rate: 26 mm/h — ABNORMAL HIGH (ref 0–16)

## 2023-01-27 LAB — BASIC METABOLIC PANEL
Anion gap: 8 (ref 5–15)
BUN: 19 mg/dL (ref 8–23)
CO2: 24 mmol/L (ref 22–32)
Calcium: 9.4 mg/dL (ref 8.9–10.3)
Chloride: 106 mmol/L (ref 98–111)
Creatinine, Ser: 1.63 mg/dL — ABNORMAL HIGH (ref 0.61–1.24)
GFR, Estimated: 46 mL/min — ABNORMAL LOW (ref >60)
Glucose, Bld: 133 mg/dL — ABNORMAL HIGH (ref 70–99)
Potassium: 4.3 mmol/L (ref 3.5–5.1)
Sodium: 138 mmol/L (ref 135–145)

## 2023-01-27 LAB — GLUCOSE, CAPILLARY
Glucose-Capillary: 134 mg/dL — ABNORMAL HIGH (ref 70–99)
Glucose-Capillary: 197 mg/dL — ABNORMAL HIGH (ref 70–99)

## 2023-01-27 LAB — CBC
HCT: 35.3 % — ABNORMAL LOW (ref 39.0–52.0)
Hemoglobin: 11.9 g/dL — ABNORMAL LOW (ref 13.0–17.0)
MCH: 27.8 pg (ref 26.0–34.0)
MCHC: 33.7 g/dL (ref 30.0–36.0)
MCV: 82.5 fL (ref 80.0–100.0)
Platelets: 154 10*3/uL (ref 150–400)
RBC: 4.28 MIL/uL (ref 4.22–5.81)
RDW: 13.6 % (ref 11.5–15.5)
WBC: 9.2 10*3/uL (ref 4.0–10.5)
nRBC: 0 % (ref 0.0–0.2)

## 2023-01-27 LAB — URIC ACID: Uric Acid, Serum: 7.4 mg/dL (ref 3.7–8.6)

## 2023-01-27 MED ORDER — OXYCODONE-ACETAMINOPHEN 10-325 MG PO TABS
1.0000 | ORAL_TABLET | Freq: Four times a day (QID) | ORAL | 0 refills | Status: DC | PRN
  Filled 2023-01-27: qty 20, 5d supply, fill #0

## 2023-01-27 MED ORDER — METHOCARBAMOL 500 MG PO TABS
500.0000 mg | ORAL_TABLET | Freq: Three times a day (TID) | ORAL | Status: DC
  Administered 2023-01-27: 500 mg via ORAL
  Filled 2023-01-27: qty 1

## 2023-01-27 MED ORDER — POLYETHYLENE GLYCOL 3350 17 G PO PACK
17.0000 g | PACK | Freq: Every day | ORAL | 0 refills | Status: AC
  Filled 2023-01-27: qty 14, 14d supply, fill #0

## 2023-01-27 MED ORDER — GABAPENTIN 300 MG PO CAPS
300.0000 mg | ORAL_CAPSULE | Freq: Three times a day (TID) | ORAL | 0 refills | Status: AC
  Filled 2023-01-27 – 2023-03-10 (×2): qty 180, 60d supply, fill #0

## 2023-01-27 MED ORDER — WRIST BRACE MISC
1.0000 | 0 refills | Status: AC | PRN
  Filled 2023-01-27: qty 1, fill #0

## 2023-01-27 MED ORDER — LOSARTAN POTASSIUM 50 MG PO TABS
100.0000 mg | ORAL_TABLET | Freq: Every day | ORAL | 3 refills | Status: AC
  Filled 2023-01-27 – 2023-03-10 (×2): qty 180, 90d supply, fill #0
  Filled 2023-06-09 (×2): qty 180, 90d supply, fill #1
  Filled 2023-09-16: qty 180, 90d supply, fill #2
  Filled 2023-12-28: qty 180, 90d supply, fill #3

## 2023-01-27 MED ORDER — GABAPENTIN 100 MG PO CAPS: 300.0000 mg | ORAL_CAPSULE | Freq: Three times a day (TID) | ORAL | Status: DC

## 2023-01-27 MED ORDER — METHOCARBAMOL 500 MG PO TABS
500.0000 mg | ORAL_TABLET | Freq: Three times a day (TID) | ORAL | 0 refills | Status: DC
  Filled 2023-01-27: qty 60, 20d supply, fill #0

## 2023-01-27 MED ORDER — AMOXICILLIN-POT CLAVULANATE 875-125 MG PO TABS
1.0000 | ORAL_TABLET | Freq: Two times a day (BID) | ORAL | 0 refills | Status: AC
  Filled 2023-01-27: qty 10, 5d supply, fill #0

## 2023-01-27 MED ORDER — AMOXICILLIN-POT CLAVULANATE 875-125 MG PO TABS
1.0000 | ORAL_TABLET | Freq: Two times a day (BID) | ORAL | Status: DC
  Administered 2023-01-27: 1 via ORAL
  Filled 2023-01-27: qty 1

## 2023-01-27 NOTE — Plan of Care (Signed)

## 2023-01-27 NOTE — Progress Notes (Signed)
Orthopedic Tech Progress Note Patient Details:  Jon Harper 1955/11/17 [id number]  Ortho Devices Type of Ortho Device: Ace wrap, Sling immobilizer Ortho Device/Splint Location: LUE Ortho Device/Splint Interventions: Ordered, Application, Adjustment   Post Interventions Patient Tolerated: Well Instructions Provided: Care of device Patient refused wrist brace, requested sling instead. Compressive ace applied to left wrist and sling for support. Darleen Crocker 01/27/2023, 5:14 PM

## 2023-01-27 NOTE — Care Management (Addendum)
Received call from bedside RN that patient's car is at [employer] and he does not have anyone who can come to pick him up. This RNCM provided a taxi voucher and instructed Rosey Bath RN to call taxi vendor once patient is ready to be discharged.    No additional TOC needs

## 2023-01-27 NOTE — Progress Notes (Signed)
Discharge instructions given to patient and all questions were answered.  

## 2023-01-28 ENCOUNTER — Other Ambulatory Visit: Payer: Self-pay

## 2023-01-28 NOTE — Discharge Summary (Signed)
Physician Discharge Summary   Patient: Jon Harper. MRN: [id number] DOB: 1955-05-10  Admit date:     01/25/2023  Discharge date: 01/27/2023  Discharge Physician: Lynden Oxford  PCP: Donell Beers, FNP  Recommendations at discharge:  Follow up with PCP in 1 week   Follow-up Information     Paseda, Baird Kay, FNP. Schedule an appointment as soon as possible for a visit in 1 week(s).   Specialty: Nurse Practitioner Why: request GI referral for colonoscopy due to diverticulitis. Contact information: [address] [place], [place] [address] [phone]                Discharge Diagnoses: Principal Problem:   Sigmoid diverticulitis Active Problems:   Type 2 diabetes mellitus without complication, without long-term current use of insulin (HCC)   Hx of renal cell cancer   History of unilateral nephrectomy   Hypertension   Neuropathy   Acute kidney injury superimposed on stage 2 chronic kidney disease (HCC)   Hyperkalemia  Hospital Course: Brief hospital course:  [name] is a 67 y.o. male with PMH significant of type II DM, neuropathy, BPH, HTN, chronic pain present to the hospital with complaints of abdominal pain and vomiting of blood. Reports that for last 3 days he has been having some abdominal pain which is diffuse in nature, continues with intermittent worsening.  He has been passing gas but no BM since last 2 days. He denies any fever or chills. Currently being treated for diverticulitis.   Assessment and Plan: Sigmoid diverticulitis Mild acute. No perforation seen no abscess. Received IV Zosyn at ER. And later on IV Unasyn. Switch to oral.  Tolerating oral diet, BM without any blood.    Hematemesis. Patient reports 2 episodes of vomiting of blood on 11/15. So far no bleeding reported in the hospital.  Hemoglobin at 13.5 appears to be adequate in nature. Given PPI twice daily as well as Carafate. Since no bleeding seen here  at present dont think PPI are indicated outpt. And can increase the risk of C diff like infection. For now would recommend to stop NSAIDS If has reoccurrence of the symptoms or has further anemia will need referral to GI for further assistance.  Acute kidney injury superimposed on stage 2 chronic kidney disease (HCC) Hyperkalemia Baseline serum creatinine around 1.3. On admission serum creatinine 1.61. BUN elevated at 28. Potassium 5.3. Corrected. Hold hydrochlorothiazide.  Continue losartan.   Type 2 diabetes mellitus without complication, without long-term current use of insulin (HCC) Hemoglobin A1c 9.8 in May 24. Held his Jardiance and glipizide. Resume    Hx of renal cell cancer History of unilateral nephrectomy Noted.  Monitor for now. Avoid NSAIDS   Hypertension Blood pressure is actually stable   Neuropathy On Cymbalta and gabapentin which I will continue. Dose increased    BPH. Continue Flomax.   Chronic pain. Left arm pain On tramadol chronically. Reported severe left arm pain. More on wrist. X rays are negative for any fracture. No evidence of thrombophlebitis.  For now would recommend pain medicines and outpt follow up.   Constipation. Stool burden seen on CT scan.  MiraLAX  Obesity Class 1 Body mass index is 33.19 kg/m.  Placing the pt at higher risk of poor outcomes.   Pain control - [employer] Controlled Substance Reporting System database was reviewed. and patient was instructed, not to drive, operate heavy machinery, perform activities at heights, swimming or participation in water activities or  provide baby-sitting services while on Pain, Sleep and Anxiety Medications; until their outpatient Physician has advised to do so again. Also recommended to not to take more than prescribed Pain, Sleep and Anxiety Medications.  Consultants:  none  Procedures performed:  none  DISCHARGE MEDICATION: Allergies as of 01/27/2023       Reactions   Advil  [ibuprofen] Other (See Comments)   Patient has only 1 kidney (right one was removed in 2016)   Aleve [naproxen] Other (See Comments)   Patient has only 1 kidney (right one was removed in 2016)        Medication List     STOP taking these medications    hydrochlorothiazide 12.5 MG tablet Commonly known as: HYDRODIURIL   hydrocortisone cream 0.5 %   naproxen 500 MG tablet Commonly known as: Naprosyn   penicillin v potassium 500 MG tablet Commonly known as: VEETID       TAKE these medications    acetaminophen 325 MG tablet Commonly known as: Tylenol Take 2 tablets (650 mg total) by mouth every 6 (six) hours as needed. What changed: reasons to take this   amoxicillin-clavulanate 875-125 MG tablet Commonly known as: AUGMENTIN Take 1 tablet by mouth 2 (two) times daily for 5 days.   aspirin EC 325 MG tablet Take 325 mg by mouth daily.   Blood Pressure Kit 1 each by Does not apply route once a week.   DULoxetine 30 MG capsule Commonly known as: CYMBALTA Take 1 capsule (30 mg total) by mouth daily. What changed: when to take this   gabapentin 300 MG capsule Commonly known as: NEURONTIN Take 1 capsule (300 mg total) by mouth 3 (three) times daily. What changed: when to take this   glipiZIDE 10 MG tablet Commonly known as: GLUCOTROL Take 1 tablet (10 mg total) by mouth 2 (two) times daily before a meal.   glucose blood test strip Use as instructed to check blood sugars twice a day.   Jardiance 25 MG Tabs tablet Generic drug: empagliflozin Take 1 tablet (25 mg total) by mouth daily before breakfast.   losartan 50 MG tablet Commonly known as: COZAAR Take 2 tablets (100 mg total) by mouth daily. What changed:  how much to take when to take this Another medication with the same name was removed. Continue taking this medication, and follow the directions you see here.   methocarbamol 500 MG tablet Commonly known as: ROBAXIN Take 1 tablet (500 mg total) by  mouth 3 (three) times daily.   metoprolol tartrate 25 MG tablet Commonly known as: LOPRESSOR Take 1 tablet (25 mg total) by mouth 2 (two) times daily. What changed: Another medication with the same name was removed. Continue taking this medication, and follow the directions you see here.   One-A-Day Mens 50+ Tabs Take 1 tablet by mouth daily with breakfast.   OneTouch Delica Plus Lancet33G Misc 1 each by Does not apply route in the morning and at bedtime.   oxyCODONE-acetaminophen 10-325 MG tablet Commonly known as: Percocet Take 1 tablet by mouth every 6 (six) hours as needed for pain.   polyethylene glycol 17 g packet Commonly known as: MIRALAX / GLYCOLAX Take 17 g by mouth daily.   tamsulosin 0.4 MG Caps capsule Commonly known as: FLOMAX Take 1 capsule (0.4 mg total) by mouth daily for BPH   traMADol 50 MG tablet Commonly known as: ULTRAM Take 1 tablet (50 mg total) by mouth 2 (two) times daily as needed for breakthrough pain.  What changed: Another medication with the same name was removed. Continue taking this medication, and follow the directions you see here.   Wrist Brace Misc 1 each by Does not apply route as needed (for pain in left wrist).       Disposition: Home Diet recommendation: low fiber soft diet  Discharge Exam: Vitals:   01/26/23 1939 01/27/23 0533 01/27/23 0900 01/27/23 1331  BP: 137/87 139/82  (!) 162/110  Pulse: 87 76  91  Resp: 18 18  18   Temp: 99.3 F (37.4 C) 99 F (37.2 C)  99 F (37.2 C)  TempSrc: Oral Oral  Oral  SpO2: 98% 95%  100%  Weight:   111 kg   Height:   6' (1.829 m)    General: Appear in mild distress; no visible Abnormal Neck Mass Or lumps, Conjunctiva normal Cardiovascular: S1 and S2 Present, no Murmur, Respiratory: good respiratory effort, Bilateral Air entry present and CTA, no Crackles, no wheezes Abdomen: Bowel Sound present, Non tender  Extremities: left arm pain with all ROM, no  Pedal edema Neurology: alert and  oriented to time, place, and person  Filed Weights   01/27/23 0900  Weight: 111 kg   Condition at discharge: stable  The results of significant diagnostics from this hospitalization (including imaging, microbiology, ancillary and laboratory) are listed below for reference.   Imaging Studies: DG Elbow 2 Views Left  Result Date: 01/27/2023 CLINICAL DATA:  Left wrist and elbow pain. EXAM: LEFT ELBOW - 2 VIEW COMPARISON:  Left elbow radiographs 07/31/2022, 10/15/2020 FINDINGS: Minimal chronic enthesopathic change at the common extensor tendon origin off of the distal humeral lateral epicondyle is unchanged. Mild chronic enthesopathic change at the triceps insertion on the olecranon. There is elevation of the distal anterior humeral fat pad and visualization of the distal posterior humeral fat pad indicating an elbow joint effusion. No definite acute fracture is seen. No dislocation. IMPRESSION: 1. Elbow joint effusion. No definite acute fracture is seen. This is similar to 10/15/2020 prior radiographs. 2. Mild chronic enthesopathic change at the common extensor tendon origin and the triceps insertion. Electronically Signed   By: Neita Garnet M.D.   On: 01/27/2023 17:16   DG Wrist Complete Left  Result Date: 01/27/2023 CLINICAL DATA:  Left wrist pain. EXAM: LEFT WRIST - COMPLETE 3+ VIEW COMPARISON:  Left forearm radiographs 07/31/2022 FINDINGS: Normal bone mineralization. Mild thumb carpometacarpal joint space narrowing and peripheral osteophytosis. No acute fracture or dislocation. IMPRESSION: Mild thumb carpometacarpal osteoarthritis. Electronically Signed   By: Neita Garnet M.D.   On: 01/27/2023 17:13   CT ABDOMEN PELVIS W CONTRAST  Result Date: 01/25/2023 CLINICAL DATA:  Acute abdominal pain. Previous right nephrectomy for renal cell carcinoma. * Tracking Code: BO * EXAM: CT ABDOMEN AND PELVIS WITH CONTRAST TECHNIQUE: Multidetector CT imaging of the abdomen and pelvis was performed using the  standard protocol following bolus administration of intravenous contrast. RADIATION DOSE REDUCTION: This exam was performed according to the departmental dose-optimization program which includes automated exposure control, adjustment of the mA and/or kV according to patient size and/or use of iterative reconstruction technique. CONTRAST:  80mL OMNIPAQUE IOHEXOL 350 MG/ML SOLN COMPARISON:  07/20/2020 FINDINGS: Lower Chest: No acute findings. Hepatobiliary: No suspicious hepatic masses identified. Gallstones are seen, however there is no evidence of cholecystitis or biliary dilatation. Pancreas:  No mass or inflammatory changes. Spleen: Within normal limits in size and appearance. Adrenals/Urinary Tract: Prior right nephrectomy again noted. No suspicious left renal masses identified. No evidence of  ureteral calculi or hydronephrosis. Stomach/Bowel: Mild sigmoid diverticulitis is seen with pericolonic inflammatory changes in the sigmoid mesentery. No No evidence of perforation or abscess. Vascular/Lymphatic: No pathologically enlarged lymph nodes. No acute vascular findings. Reproductive:  Mildly enlarged prostate. Other:  None. Musculoskeletal:  No suspicious bone lesions identified. IMPRESSION: Mild uncomplicated sigmoid diverticulitis. No evidence of perforation or abscess. Prior right nephrectomy. No evidence of recurrent or metastatic carcinoma. Cholelithiasis. No radiographic evidence of cholecystitis. Electronically Signed   By: Danae Orleans M.D.   On: 01/25/2023 14:02    Microbiology: Results for orders placed or performed during the hospital encounter of 12/19/20  Resp Panel by RT-PCR (Flu A&B, Covid) Nasopharyngeal Swab     Status: None   Collection Time: 12/19/20  8:51 PM   Specimen: Nasopharyngeal Swab; Nasopharyngeal(NP) swabs in vial transport medium  Result Value Ref Range Status   SARS Coronavirus 2 by RT PCR NEGATIVE NEGATIVE Final    Comment: (NOTE) SARS-CoV-2 target nucleic acids are NOT  DETECTED.  The SARS-CoV-2 RNA is generally detectable in upper respiratory specimens during the acute phase of infection. The lowest concentration of SARS-CoV-2 viral copies this assay can detect is 138 copies/mL. A negative result does not preclude SARS-Cov-2 infection and should not be used as the sole basis for treatment or other patient management decisions. A negative result may occur with  improper specimen collection/handling, submission of specimen other than nasopharyngeal swab, presence of viral mutation(s) within the areas targeted by this assay, and inadequate number of viral copies(<138 copies/mL). A negative result must be combined with clinical observations, patient history, and epidemiological information. The expected result is Negative.  Fact Sheet for Patients:  BloggerCourse.com  Fact Sheet for Healthcare Providers:  SeriousBroker.it  This test is no t yet approved or cleared by the [place] FDA and  has been authorized for detection and/or diagnosis of SARS-CoV-2 by FDA under an Emergency Use Authorization (EUA). This EUA will remain  in effect (meaning this test can be used) for the duration of the COVID-19 declaration under Section 564(b)(1) of the Act, 21 U.S.C.section 360bbb-3(b)(1), unless the authorization is terminated  or revoked sooner.       Influenza A by PCR NEGATIVE NEGATIVE Final   Influenza B by PCR NEGATIVE NEGATIVE Final    Comment: (NOTE) The Xpert Xpress SARS-CoV-2/FLU/RSV plus assay is intended as an aid in the diagnosis of influenza from Nasopharyngeal swab specimens and should not be used as a sole basis for treatment. Nasal washings and aspirates are unacceptable for Xpert Xpress SARS-CoV-2/FLU/RSV testing.  Fact Sheet for Patients: BloggerCourse.com  Fact Sheet for Healthcare Providers: SeriousBroker.it  This test is not yet  approved or cleared by the [place] FDA and has been authorized for detection and/or diagnosis of SARS-CoV-2 by FDA under an Emergency Use Authorization (EUA). This EUA will remain in effect (meaning this test can be used) for the duration of the COVID-19 declaration under Section 564(b)(1) of the Act, 21 U.S.C. section 360bbb-3(b)(1), unless the authorization is terminated or revoked.  Performed at [facility], [address], [place], [place] [address]    Labs: CBC: Recent Labs  Lab 01/25/23 1150 01/26/23 0512 01/27/23 0639  WBC 10.1 9.7 9.2  NEUTROABS 6.8  --   --   HGB 13.5 12.1* 11.9*  HCT 39.6 36.3* 35.3*  MCV 81.3 83.8 82.5  PLT 197 163 154   Basic Metabolic Panel: Recent Labs  Lab 01/25/23 1150 01/25/23 1708 01/26/23 0512 01/27/23 0639  NA  138 137 138 138  K 5.3* 3.8 4.0 4.3  CL 103 102 105 106  CO2 26 25 25 24   GLUCOSE 171* 169* 120* 133*  BUN 28* 28* 22 19  CREATININE 1.61* 1.60* 1.44* 1.63*  CALCIUM 9.8 9.5 9.1 9.4   Liver Function Tests: Recent Labs  Lab 01/25/23 1150 01/25/23 1708 01/26/23 0512  AST 36 18 14*  ALT 33 32 27  ALKPHOS 56 59 54  BILITOT 0.6 0.7 0.6  PROT 8.3* 7.9 7.3  ALBUMIN 4.2 3.7 3.3*   CBG: Recent Labs  Lab 01/26/23 1131 01/26/23 1713 01/26/23 2121 01/27/23 0736 01/27/23 1140  GLUCAP 159* 178* 169* 134* 197*    Discharge time spent: greater than 30 minutes.  Author: Lynden Oxford, MD  [facility] 01/27/2023

## 2023-01-30 DIAGNOSIS — E669 Obesity, unspecified: Secondary | ICD-10-CM | POA: Diagnosis not present

## 2023-01-30 DIAGNOSIS — Z7189 Other specified counseling: Secondary | ICD-10-CM | POA: Diagnosis not present

## 2023-01-30 DIAGNOSIS — E1140 Type 2 diabetes mellitus with diabetic neuropathy, unspecified: Secondary | ICD-10-CM | POA: Diagnosis not present

## 2023-01-30 DIAGNOSIS — Z79899 Other long term (current) drug therapy: Secondary | ICD-10-CM | POA: Diagnosis not present

## 2023-01-30 DIAGNOSIS — K5732 Diverticulitis of large intestine without perforation or abscess without bleeding: Secondary | ICD-10-CM | POA: Diagnosis not present

## 2023-01-30 DIAGNOSIS — R011 Cardiac murmur, unspecified: Secondary | ICD-10-CM | POA: Diagnosis not present

## 2023-01-30 DIAGNOSIS — Z6831 Body mass index (BMI) 31.0-31.9, adult: Secondary | ICD-10-CM | POA: Diagnosis not present

## 2023-03-10 ENCOUNTER — Other Ambulatory Visit: Payer: Self-pay

## 2023-03-10 ENCOUNTER — Other Ambulatory Visit: Payer: Self-pay | Admitting: Nurse Practitioner

## 2023-03-10 DIAGNOSIS — N1831 Chronic kidney disease, stage 3a: Secondary | ICD-10-CM

## 2023-03-10 DIAGNOSIS — I10 Essential (primary) hypertension: Secondary | ICD-10-CM

## 2023-03-10 DIAGNOSIS — E1169 Type 2 diabetes mellitus with other specified complication: Secondary | ICD-10-CM

## 2023-03-10 MED ORDER — EMPAGLIFLOZIN 25 MG PO TABS
25.0000 mg | ORAL_TABLET | Freq: Every day | ORAL | 1 refills | Status: DC
  Filled 2023-03-10: qty 90, 90d supply, fill #0
  Filled 2023-07-22: qty 90, 90d supply, fill #1

## 2023-03-10 MED ORDER — METOPROLOL TARTRATE 25 MG PO TABS
25.0000 mg | ORAL_TABLET | Freq: Two times a day (BID) | ORAL | 1 refills | Status: DC
  Filled 2023-03-10: qty 180, 90d supply, fill #0
  Filled 2023-06-09 (×2): qty 180, 90d supply, fill #1

## 2023-03-11 ENCOUNTER — Other Ambulatory Visit: Payer: Self-pay

## 2023-04-01 ENCOUNTER — Emergency Department (HOSPITAL_COMMUNITY)
Admission: EM | Admit: 2023-04-01 | Discharge: 2023-04-01 | Disposition: A | Discharge status: Home / Self Care | Payer: Medicare (Managed Care) | Attending: Emergency Medicine | Admitting: Emergency Medicine

## 2023-04-01 ENCOUNTER — Encounter (HOSPITAL_COMMUNITY): Payer: Self-pay

## 2023-04-01 ENCOUNTER — Other Ambulatory Visit: Payer: Self-pay

## 2023-04-01 DIAGNOSIS — G629 Polyneuropathy, unspecified: Secondary | ICD-10-CM | POA: Diagnosis not present

## 2023-04-01 DIAGNOSIS — G5793 Unspecified mononeuropathy of bilateral lower limbs: Secondary | ICD-10-CM | POA: Insufficient documentation

## 2023-04-01 DIAGNOSIS — M79671 Pain in right foot: Secondary | ICD-10-CM | POA: Diagnosis present

## 2023-04-01 MED ORDER — ACETAMINOPHEN 325 MG PO TABS
650.0000 mg | ORAL_TABLET | Freq: Once | ORAL | Status: AC
  Administered 2023-04-01: 650 mg via ORAL
  Filled 2023-04-01: qty 2

## 2023-04-01 NOTE — ED Triage Notes (Signed)
Pt arrives with c/o right foot pain that has been ongoing for weeks. Pt describes pain as pins and needles sensation. Pt does have diabetes.

## 2023-04-01 NOTE — ED Provider Notes (Signed)
[place] EMERGENCY DEPARTMENT AT [facility] Provider Note   CSN: [id number] Arrival date & time: 04/01/23  0006     History  Chief Complaint  Patient presents with   Foot Pain    Jon Harper. is a 68 y.o. male.  Patient to ED with 3-4 days of R>L foot pain and swelling. This has been an ongoing issue for him for months, managed by his doctor with gabapentin that helped initially. No recent injury. No SOB, chest pain, redness, fever.   The history is provided by the patient. No language interpreter was used.  Foot Pain       Home Medications Prior to Admission medications   Medication Sig Start Date End Date Taking? Authorizing Provider  acetaminophen (TYLENOL) 325 MG tablet Take 2 tablets (650 mg total) by mouth every 6 (six) hours as needed. Patient taking differently: Take 650 mg by mouth every 6 (six) hours as needed for mild pain (pain score 1-3) or headache. 08/26/22   Renne Crigler, PA-C  aspirin EC 325 MG tablet Take 325 mg by mouth daily.    [provider]  Blood Pressure KIT 1 each by Does not apply route once a week. 07/30/22   Donell Beers, FNP  DULoxetine (CYMBALTA) 30 MG capsule Take 1 capsule (30 mg total) by mouth daily. Patient taking differently: Take 30 mg by mouth in the morning. 10/08/22     empagliflozin (JARDIANCE) 25 MG TABS tablet Take 1 tablet (25 mg total) by mouth daily before breakfast. 03/10/23   Paseda, Baird Kay, FNP  gabapentin (NEURONTIN) 300 MG capsule Take 1 capsule (300 mg total) by mouth 3 (three) times daily. 01/27/23   Rolly Salter, MD  glipiZIDE (GLUCOTROL) 10 MG tablet Take 1 tablet (10 mg total) by mouth 2 (two) times daily before a meal. 10/08/22     glucose blood test strip Use as instructed to check blood sugars twice a day. 07/30/22   Paseda, Baird Kay, FNP  Lancets (ONETOUCH DELICA PLUS LANCET33G) MISC 1 each by Does not apply route in the morning and at bedtime. 07/30/22   Paseda, Baird Kay,  FNP  losartan (COZAAR) 50 MG tablet Take 2 tablets (100 mg total) by mouth daily. 01/27/23   Rolly Salter, MD  methocarbamol (ROBAXIN) 500 MG tablet Take 1 tablet (500 mg total) by mouth 3 (three) times daily. 01/27/23   Rolly Salter, MD  metoprolol tartrate (LOPRESSOR) 25 MG tablet Take 1 tablet (25 mg total) by mouth 2 (two) times daily. 03/10/23 07/08/23  Donell Beers, FNP  Misc. Devices (WRIST BRACE) MISC 1 each by Does not apply route as needed (for pain in left wrist). 01/27/23   Rolly Salter, MD  Multiple Vitamins-Minerals (ONE-A-DAY MENS 50+) TABS Take 1 tablet by mouth daily with breakfast.    [provider]  oxyCODONE-acetaminophen (PERCOCET) 10-325 MG tablet Take 1 tablet by mouth every 6 (six) hours as needed for pain. 01/27/23 01/27/24  Rolly Salter, MD  polyethylene glycol (MIRALAX / GLYCOLAX) 17 g packet Take 17 g by mouth daily. 01/27/23   Rolly Salter, MD  tamsulosin (FLOMAX) 0.4 MG CAPS capsule Take 1 capsule (0.4 mg total) by mouth daily for BPH Patient not taking: Reported on 01/25/2023 09/10/22     traMADol (ULTRAM) 50 MG tablet Take 1 tablet (50 mg total) by mouth 2 (two) times daily as needed for breakthrough pain. 11/06/22  Allergies    Advil [ibuprofen] and Aleve [naproxen]    Review of Systems   Review of Systems  Physical Exam Updated Vital Signs BP 135/66 (BP Location: Right Arm)   Pulse 73   Temp 98 F (36.7 C) (Oral)   Resp 18   Wt 110.7 kg   SpO2 99%   BMI 33.09 kg/m  Physical Exam Vitals and nursing note reviewed.  Constitutional:      Appearance: He is well-developed.  Pulmonary:     Effort: Pulmonary effort is normal.  Musculoskeletal:        General: Normal range of motion.     Cervical back: Normal range of motion.     Comments: Feet do not appear swollen. No redness. Feet are diffusely tender, warm to touch. No skin breakdown. No interphalangeal rash, ulceration. DP and PT pulse present.   Skin:     General: Skin is warm and dry.  Neurological:     Mental Status: He is alert and oriented to person, place, and time.     ED Results / Procedures / Treatments   Labs (all labs ordered are listed, but only abnormal results are displayed) Labs Reviewed - No data to display  EKG None  Radiology No results found.  Procedures Procedures    Medications Ordered in ED Medications  acetaminophen (TYLENOL) tablet 650 mg (650 mg Oral Given 04/01/23 0035)    ED Course/ Medical Decision Making/ A&P Clinical Course as of 04/01/23 0730  Tue Apr 01, 2023  0726 Mr. Sunde is having "pins and needles" type R>L foot pain and reported swelling. No evidence tonight of infection, swelling, vascular compromise. Most likely secondary to neuropathy for which he is taking 300 mg TID gabapentin. Discussed with pharmacy. Will double the night dose to 600 mg. Patient encouraged to follow up with PCP for recheck to insure symptoms are improving at that dose.  [SU]    Clinical Course User Index [SU] Elpidio Anis, PA-C                                 Medical Decision Making Risk OTC drugs.           Final Clinical Impression(s) / ED Diagnoses Final diagnoses:  Neuropathy    Rx / DC Orders ED Discharge Orders     None         Elpidio Anis, PA-C 04/01/23 0730    Zadie Rhine, MD 04/02/23 [phone]

## 2023-04-01 NOTE — Discharge Instructions (Signed)
You are currently taking 300 mg Gabapentin three times a day. The pain in your feet is likely related to the neuropathy this medication is treating. You can increase your nightly dose from 300 mg to 600 mg every night.   Please follow up with your doctor in one week to recheck for improvement.

## 2023-04-02 ENCOUNTER — Ambulatory Visit: Payer: Self-pay | Admitting: Nurse Practitioner

## 2023-04-08 ENCOUNTER — Telehealth: Payer: Self-pay

## 2023-04-08 NOTE — Telephone Encounter (Signed)
TOC call

## 2023-04-09 ENCOUNTER — Telehealth: Payer: Self-pay

## 2023-04-09 NOTE — Transitions of Care (Post Inpatient/ED Visit) (Cosign Needed)
   04/09/2023  Name: Jon Harper. MRN: [id number] DOB: April 25, 1955  Today's TOC FU Call Status: Today's TOC FU Call Status:: Unsuccessful Call (2nd Attempt) Unsuccessful Call (2nd Attempt) Date: 04/09/23  Attempted to reach the patient regarding the most recent Inpatient/ED visit.  Follow Up Plan: Additional outreach attempts will be made to reach the patient to complete the Transitions of Care (Post Inpatient/ED visit) call.   Signature .kh

## 2023-04-14 ENCOUNTER — Other Ambulatory Visit: Payer: Self-pay

## 2023-04-14 ENCOUNTER — Emergency Department (HOSPITAL_COMMUNITY): Payer: Medicare (Managed Care)

## 2023-04-14 ENCOUNTER — Emergency Department (HOSPITAL_COMMUNITY)
Admission: EM | Admit: 2023-04-14 | Discharge: 2023-04-14 | Disposition: A | Discharge status: Home / Self Care | Payer: Medicare (Managed Care) | Attending: Emergency Medicine | Admitting: Emergency Medicine

## 2023-04-14 DIAGNOSIS — Z7982 Long term (current) use of aspirin: Secondary | ICD-10-CM | POA: Diagnosis not present

## 2023-04-14 DIAGNOSIS — M25569 Pain in unspecified knee: Secondary | ICD-10-CM | POA: Diagnosis present

## 2023-04-14 DIAGNOSIS — Y990 Civilian activity done for income or pay: Secondary | ICD-10-CM | POA: Diagnosis not present

## 2023-04-14 DIAGNOSIS — M25561 Pain in right knee: Secondary | ICD-10-CM | POA: Diagnosis not present

## 2023-04-14 DIAGNOSIS — W010XXA Fall on same level from slipping, tripping and stumbling without subsequent striking against object, initial encounter: Secondary | ICD-10-CM | POA: Diagnosis not present

## 2023-04-14 DIAGNOSIS — M25562 Pain in left knee: Secondary | ICD-10-CM | POA: Diagnosis not present

## 2023-04-14 DIAGNOSIS — W19XXXA Unspecified fall, initial encounter: Secondary | ICD-10-CM

## 2023-04-14 DIAGNOSIS — Z794 Long term (current) use of insulin: Secondary | ICD-10-CM | POA: Diagnosis not present

## 2023-04-14 DIAGNOSIS — M545 Low back pain, unspecified: Secondary | ICD-10-CM | POA: Diagnosis not present

## 2023-04-14 MED ORDER — OXYCODONE-ACETAMINOPHEN 10-325 MG PO TABS
1.0000 | ORAL_TABLET | Freq: Three times a day (TID) | ORAL | 0 refills | Status: DC | PRN
  Filled 2023-04-14: qty 10, 4d supply, fill #0

## 2023-04-14 MED ORDER — PREDNISONE 20 MG PO TABS
40.0000 mg | ORAL_TABLET | Freq: Every day | ORAL | 0 refills | Status: AC
  Filled 2023-04-14: qty 8, 4d supply, fill #0

## 2023-04-14 MED ORDER — OXYCODONE-ACETAMINOPHEN 5-325 MG PO TABS
1.0000 | ORAL_TABLET | ORAL | Status: AC | PRN
  Administered 2023-04-14 (×2): 1 via ORAL
  Filled 2023-04-14 (×2): qty 1

## 2023-04-14 MED ORDER — PREDNISONE 20 MG PO TABS
60.0000 mg | ORAL_TABLET | ORAL | Status: AC
  Administered 2023-04-14: 60 mg via ORAL
  Filled 2023-04-14: qty 3

## 2023-04-14 NOTE — ED Triage Notes (Signed)
Patient arrives from work where he tripped over a power washing cord and fell onto L knee and then rolled onto back. Has pain to lower back and L knee. States he heard a pop and is unable to bear weight.

## 2023-04-14 NOTE — ED Notes (Signed)
 Pt to XRAY

## 2023-04-14 NOTE — ED Provider Notes (Signed)
[facility] AT [facility] Provider Note   CSN: [id number] Arrival date & time: 04/14/23  1009     History  Chief Complaint  Patient presents with   Jon Harper. is a 68 y.o. male.  HPI Presents after a fall that occurred at work.  He works at a [employer], dispensing.  Patient works in the [employer].  He was at work, tripped over a cord, fell felt his back twist, and sudden onset of knee pain.  Since that time he said pain in both these areas.  No loss of sensation distally, no incontinence, no head trauma.  No medication taken for relief.    Home Medications Prior to Admission medications   Medication Sig Start Date End Date Taking? Authorizing Provider  predniSONE (DELTASONE) 20 MG tablet Take 2 tablets (40 mg total) by mouth daily with breakfast. For the next four days 04/14/23  Yes Gerhard Munch, MD  acetaminophen (TYLENOL) 325 MG tablet Take 2 tablets (650 mg total) by mouth every 6 (six) hours as needed. Patient taking differently: Take 650 mg by mouth every 6 (six) hours as needed for mild pain (pain score 1-3) or headache. 08/26/22   Renne Crigler, PA-C  aspirin EC 325 MG tablet Take 325 mg by mouth daily.    [provider]  Blood Pressure KIT 1 each by Does not apply route once a week. 07/30/22   Donell Beers, FNP  DULoxetine (CYMBALTA) 30 MG capsule Take 1 capsule (30 mg total) by mouth daily. Patient taking differently: Take 30 mg by mouth in the morning. 10/08/22     empagliflozin (JARDIANCE) 25 MG TABS tablet Take 1 tablet (25 mg total) by mouth daily before breakfast. 03/10/23   Paseda, Baird Kay, FNP  gabapentin (NEURONTIN) 300 MG capsule Take 1 capsule (300 mg total) by mouth 3 (three) times daily. 01/27/23   Rolly Salter, MD  glipiZIDE (GLUCOTROL) 10 MG tablet Take 1 tablet (10 mg total) by mouth 2 (two) times daily before a meal. 10/08/22     glucose blood test strip Use as instructed to check blood  sugars twice a day. 07/30/22   Paseda, Baird Kay, FNP  Lancets (ONETOUCH DELICA PLUS LANCET33G) MISC 1 each by Does not apply route in the morning and at bedtime. 07/30/22   Paseda, Baird Kay, FNP  losartan (COZAAR) 50 MG tablet Take 2 tablets (100 mg total) by mouth daily. 01/27/23   Rolly Salter, MD  methocarbamol (ROBAXIN) 500 MG tablet Take 1 tablet (500 mg total) by mouth 3 (three) times daily. 01/27/23   Rolly Salter, MD  metoprolol tartrate (LOPRESSOR) 25 MG tablet Take 1 tablet (25 mg total) by mouth 2 (two) times daily. 03/10/23 07/08/23  Donell Beers, FNP  Misc. Devices (WRIST BRACE) MISC 1 each by Does not apply route as needed (for pain in left wrist). 01/27/23   Rolly Salter, MD  Multiple Vitamins-Minerals (ONE-A-DAY MENS 50+) TABS Take 1 tablet by mouth daily with breakfast.    [provider]  oxyCODONE-acetaminophen (PERCOCET) 10-325 MG tablet Take 1 tablet by mouth every 8 (eight) hours as needed for pain. 04/14/23 04/13/24  Gerhard Munch, MD  polyethylene glycol (MIRALAX / GLYCOLAX) 17 g packet Take 17 g by mouth daily. 01/27/23   Rolly Salter, MD  tamsulosin (FLOMAX) 0.4 MG CAPS capsule Take 1 capsule (0.4 mg total) by mouth daily for BPH Patient not taking:  Reported on 01/25/2023 09/10/22     traMADol (ULTRAM) 50 MG tablet Take 1 tablet (50 mg total) by mouth 2 (two) times daily as needed for breakthrough pain. 11/06/22         Allergies    Advil [ibuprofen] and Aleve [naproxen]    Review of Systems   Review of Systems  Physical Exam Updated Vital Signs BP (!) 122/91 (BP Location: Right Arm)   Pulse 79   Temp 98.4 F (36.9 C) (Oral)   Resp 16   Ht 6' (1.829 m)   Wt 113.4 kg   SpO2 97%   BMI 33.91 kg/m  Physical Exam Vitals and nursing note reviewed.  Constitutional:      General: He is not in acute distress.    Appearance: He is well-developed.  HENT:     Head: Normocephalic and atraumatic.  Eyes:     Conjunctiva/sclera:  Conjunctivae normal.  Cardiovascular:     Rate and Rhythm: Normal rate and regular rhythm.  Pulmonary:     Effort: Pulmonary effort is normal. No respiratory distress.     Breath sounds: No stridor.  Abdominal:     General: There is no distension.     Tenderness: There is no abdominal tenderness. There is no guarding.  Musculoskeletal:     Comments: No obvious deformities.  Patient flexes each hip to command, can bend his knee, though he has tenderness palpation about the anterior surface.  No deformity.  Ankles unremarkable.  Skin:    General: Skin is warm and dry.  Neurological:     Mental Status: He is alert and oriented to person, place, and time.     ED Results / Procedures / Treatments   Labs (all labs ordered are listed, but only abnormal results are displayed) Labs Reviewed - No data to display  EKG None  Radiology DG Knee Complete 4 Views Left Result Date: 04/14/2023 CLINICAL DATA:  Fall.  Back and left knee pain. EXAM: LEFT KNEE - COMPLETE 4+ VIEW; LUMBAR SPINE - 2-3 VIEW COMPARISON:  Lumbar spine radiographs 12/22/2022. FINDINGS: Lumbar spine: There are 5 lumbar type vertebral bodies. The alignment is normal. The disc spaces are preserved with stable mild intervertebral spurring and facet hypertrophy. Left knee: The mineralization and alignment are normal. There is no evidence of acute fracture or dislocation. Mild tricompartmental degenerative changes, greatest within the lateral compartment. There is evidence of a small to moderate knee joint effusion. There is spurring at the quadriceps and patellar tendon insertions on the patella. IMPRESSION: 1. No evidence of acute fracture or dislocation in the lumbar spine or left knee. 2. 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 Electronically Signed   By: Carey Bullocks M.D.   On: 04/14/2023 12:26   DG Lumbar Spine 2-3 Views Result Date: 04/14/2023 CLINICAL DATA:  Fall.   Back and left knee pain. EXAM: LEFT KNEE - COMPLETE 4+ VIEW; LUMBAR SPINE - 2-3 VIEW COMPARISON:  Lumbar spine radiographs 12/22/2022. FINDINGS: Lumbar spine: There are 5 lumbar type vertebral bodies. The alignment is normal. The disc spaces are preserved with stable mild intervertebral spurring and facet hypertrophy. Left knee: The mineralization and alignment are normal. There is no evidence of acute fracture or dislocation. Mild tricompartmental degenerative changes, greatest within the lateral compartment. There is evidence of a small to moderate knee joint effusion. There is spurring at the quadriceps and patellar tendon insertions on the patella. IMPRESSION: 1. No evidence of  acute fracture or dislocation in the lumbar spine or left knee. 2. 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 Electronically Signed   By: Carey Bullocks M.D.   On: 04/14/2023 12:26    Procedures Procedures    Medications Ordered in ED Medications  predniSONE (DELTASONE) tablet 60 mg (has no administration in time range)  oxyCODONE-acetaminophen (PERCOCET/ROXICET) 5-325 MG per tablet 1 tablet (1 tablet Oral Given 04/14/23 1239)    ED Course/ Medical Decision Making/ A&P                                 Medical Decision Making Male presents after mechanical fall at work.  Patient is awake, alert, speaking clearly, he is hemodynamically stable, neurovascular intact, does have multiple areas of pain.  Concern for soft tissue injury versus fracture, x-rays ordered, analgesia ordered. Patient has prior nephrectomy is not a candidate for NSAIDtherapy.   Amount and/or Complexity of Data Reviewed Radiology: ordered and independent interpretation performed. Decision-making details documented in ED Course.  Risk Prescription drug management.   1:51 PM On repeat exam patient is awake, alert, calm, much more comfortable appearance after analgesia was provided.   X-rays reviewed, no fracture in the lower back, knee, there is a left knee effusion.  Patient is otherwise hemodynamically stable, appropriate for discharge with anti-inflammatories, analgesics, Ortho follow-up. Final Clinical Impression(s) / ED Diagnoses Final diagnoses:  Fall, initial encounter    Rx / DC Orders ED Discharge Orders          Ordered    oxyCODONE-acetaminophen (PERCOCET) 10-325 MG tablet  Every 8 hours PRN        04/14/23 1350    predniSONE (DELTASONE) 20 MG tablet  Daily with breakfast        04/14/23 1350              Gerhard Munch, MD 04/14/23 1351
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
## 2023-04-15 ENCOUNTER — Telehealth: Payer: Self-pay

## 2023-04-15 ENCOUNTER — Other Ambulatory Visit: Payer: Self-pay

## 2023-04-15 NOTE — Transitions of Care (Post Inpatient/ED Visit) (Signed)
   04/15/2023  Name: Jon Harper. MRN: [id number] DOB: 1955-11-06  Today's TOC FU Call Status: Today's TOC FU Call Status:: Unsuccessful Call (1st Attempt) Unsuccessful Call (1st Attempt) Date: 04/15/23  Attempted to reach the patient regarding the most recent Inpatient/ED visit.  Follow Up Plan: Additional outreach attempts will be made to reach the patient to complete the Transitions of Care (Post Inpatient/ED visit) call.   Signature  [employer], CMA

## 2023-04-16 ENCOUNTER — Telehealth: Payer: Self-pay

## 2023-04-16 NOTE — Transitions of Care (Post Inpatient/ED Visit) (Signed)
 04/16/2023  Name: Jon Harper. MRN: [id number] DOB: 09-09-1955  Today's TOC FU Call Status:   Patient's Name and Date of Birth confirmed.  Transition Care Management Follow-up Telephone Call Discharge Facility: [facility]) Type of Discharge: Emergency Department Reason for ED Visit: Other: How have you been since you were released from the hospital?: Better Any questions or concerns?: No  Items Reviewed: Did you receive and understand the discharge instructions provided?: Yes Medications obtained,verified, and reconciled?: Yes (Medications Reviewed) Any new allergies since your discharge?: No Dietary orders reviewed?: No Do you have support at home?: Yes People in Home: friend(s)  Medications Reviewed Today: Medications Reviewed Today     Reviewed by Starlene Charlynn BIRCH, CMA (Certified Medical Assistant) on 04/16/23 at 1016  Med List Status: <None>   Medication Order Taking? Sig Documenting Provider Last Dose Status Informant  acetaminophen  (TYLENOL ) 325 MG tablet [id number] Yes Take 2 tablets (650 mg total) by mouth every 6 (six) hours as needed.  Patient taking differently: Take 650 mg by mouth every 6 (six) hours as needed for mild pain (pain score 1-3) or headache.   Desiderio Chew, PA-C Taking Active Self  aspirin  EC 325 MG tablet [id number] Yes Take 325 mg by mouth daily. [provider] Taking Active Self  Blood Pressure KIT [id number] Yes 1 each by Does not apply route once a week. Paseda, Folashade R, FNP Taking Active   DULoxetine  (CYMBALTA ) 30 MG capsule [id number] Yes Take 1 capsule (30 mg total) by mouth daily.  Patient taking differently: Take 30 mg by mouth in the morning.    Taking Active Self           Med Note MARISA NATHANEL SAILOR   Sat Jan 25, 2023  6:18 PM) The patient said he has been taking only 30 mg a day by error  empagliflozin  (JARDIANCE ) 25 MG TABS tablet [id number] Yes Take 1 tablet (25 mg total) by mouth daily before breakfast. Paseda,  Folashade R, FNP Taking Active   gabapentin  (NEURONTIN ) 300 MG capsule [id number] Yes Take 1 capsule (300 mg total) by mouth 3 (three) times daily. Tobie Yetta HERO, MD Taking Active   glipiZIDE  (GLUCOTROL ) 10 MG tablet [id number] Yes Take 1 tablet (10 mg total) by mouth 2 (two) times daily before a meal.  Taking Active Self  glucose blood test strip [id number] Yes Use as instructed to check blood sugars twice a day. Paseda, Folashade R, FNP Taking Active   Lancets [facility] DELICA PLUS [place]) MISC [id number] Yes 1 each by Does not apply route in the morning and at bedtime. Paseda, Folashade R, FNP Taking Active   losartan  (COZAAR ) 50 MG tablet [id number] Yes Take 2 tablets (100 mg total) by mouth daily. Tobie Yetta HERO, MD Taking Active   methocarbamol  (ROBAXIN ) 500 MG tablet [id number] Yes Take 1 tablet (500 mg total) by mouth 3 (three) times daily. Tobie Yetta HERO, MD Taking Active   metoprolol  tartrate (LOPRESSOR ) 25 MG tablet [id number] Yes Take 1 tablet (25 mg total) by mouth 2 (two) times daily. Paseda, Folashade R, FNP Taking Active   Misc. Devices (WRIST BRACE) MISC [id number] Yes 1 each by Does not apply route as needed (for pain in left wrist). Tobie Yetta HERO, MD Taking Active   Multiple Vitamins-Minerals (ONE-A-DAY MENS 50+) TABS [id number] Yes Take 1 tablet by mouth daily with breakfast. [provider] Taking Active Self  oxyCODONE -acetaminophen  (PERCOCET) 10-325 MG tablet [id number] Yes Take 1 tablet by mouth every 8 (eight)  hours as needed for pain. Garrick Charleston, MD Taking Active   polyethylene glycol (MIRALAX  / GLYCOLAX ) 17 g packet [id number] Yes Take 17 g by mouth daily. Tobie Yetta HERO, MD Taking Active   predniSONE  (DELTASONE ) 20 MG tablet [id number] Yes Take 2 tablets (40 mg total) by mouth daily with breakfast. For the next four days Garrick Charleston, MD Taking Active   tamsulosin  (FLOMAX ) 0.4 MG CAPS capsule [id number] No Take 1 capsule (0.4 mg total) by mouth daily for BPH   Patient not taking: Reported on 01/25/2023    Not Taking Active Self  traMADol  (ULTRAM ) 50 MG tablet [id number] Yes Take 1 tablet (50 mg total) by mouth 2 (two) times daily as needed for breakthrough pain.  Taking Active Self            Home Care and Equipment/Supplies: Were Home Health Services Ordered?: No Any new equipment or medical supplies ordered?: No  Functional Questionnaire: Do you need assistance with bathing/showering or dressing?: No Do you need assistance with meal preparation?: No Do you need assistance with eating?: No Do you have difficulty maintaining continence: No Do you need assistance with getting out of bed/getting out of a chair/moving?: No Do you have difficulty managing or taking your medications?: No  Follow up appointments reviewed: PCP Follow-up appointment confirmed?: No MD Provider Line Number:[phone] Given: No Specialist Hospital Follow-up appointment confirmed?: No Do you need transportation to your follow-up appointment?: No Do you understand care options if your condition(s) worsen?: Yes-patient verbalized understanding  SDOH Interventions Today    Flowsheet Row Most Recent Value  SDOH Interventions   Housing Interventions Other (Comment)       SIGNATURE Littie Chiem, CMA

## 2023-05-12 ENCOUNTER — Other Ambulatory Visit: Payer: Self-pay

## 2023-05-12 DIAGNOSIS — M25562 Pain in left knee: Secondary | ICD-10-CM | POA: Diagnosis not present

## 2023-05-12 DIAGNOSIS — M545 Low back pain, unspecified: Secondary | ICD-10-CM | POA: Diagnosis not present

## 2023-05-12 MED ORDER — DICLOFENAC SODIUM 50 MG PO TBEC
50.0000 mg | DELAYED_RELEASE_TABLET | Freq: Two times a day (BID) | ORAL | 0 refills | Status: AC
  Filled 2023-05-12: qty 14, 7d supply, fill #0

## 2023-05-13 ENCOUNTER — Other Ambulatory Visit: Payer: Self-pay

## 2023-05-13 ENCOUNTER — Telehealth: Payer: Self-pay

## 2023-05-13 NOTE — Transitions of Care (Post Inpatient/ED Visit) (Signed)
   05/13/2023  Name: Jon Harper. MRN: [id number] DOB: April 19, 1955  Today's TOC FU Call Status: Today's TOC FU Call Status:: Unsuccessful Call (1st Attempt) Unsuccessful Call (1st Attempt) Date: 05/13/23  Attempted to reach the patient regarding the most recent Inpatient/ED visit.  Follow Up Plan: Additional outreach attempts will be made to reach the patient to complete the Transitions of Care (Post Inpatient/ED visit) call.   Signature  [employer], [place]

## 2023-05-15 ENCOUNTER — Telehealth: Payer: Self-pay

## 2023-05-15 DIAGNOSIS — F320 Major depressive disorder, single episode, mild: Secondary | ICD-10-CM | POA: Diagnosis not present

## 2023-05-15 DIAGNOSIS — I129 Hypertensive chronic kidney disease with stage 1 through stage 4 chronic kidney disease, or unspecified chronic kidney disease: Secondary | ICD-10-CM | POA: Diagnosis not present

## 2023-05-15 DIAGNOSIS — E669 Obesity, unspecified: Secondary | ICD-10-CM | POA: Diagnosis not present

## 2023-05-15 DIAGNOSIS — Y990 Civilian activity done for income or pay: Secondary | ICD-10-CM | POA: Diagnosis not present

## 2023-05-15 DIAGNOSIS — N1832 Chronic kidney disease, stage 3b: Secondary | ICD-10-CM | POA: Diagnosis not present

## 2023-05-15 DIAGNOSIS — Z6833 Body mass index (BMI) 33.0-33.9, adult: Secondary | ICD-10-CM | POA: Diagnosis not present

## 2023-05-15 DIAGNOSIS — Z0000 Encounter for general adult medical examination without abnormal findings: Secondary | ICD-10-CM | POA: Diagnosis not present

## 2023-05-15 DIAGNOSIS — R011 Cardiac murmur, unspecified: Secondary | ICD-10-CM | POA: Diagnosis not present

## 2023-05-15 DIAGNOSIS — E1140 Type 2 diabetes mellitus with diabetic neuropathy, unspecified: Secondary | ICD-10-CM | POA: Diagnosis not present

## 2023-05-15 DIAGNOSIS — Z79899 Other long term (current) drug therapy: Secondary | ICD-10-CM | POA: Diagnosis not present

## 2023-05-15 DIAGNOSIS — Z139 Encounter for screening, unspecified: Secondary | ICD-10-CM | POA: Diagnosis not present

## 2023-05-15 NOTE — Transitions of Care (Post Inpatient/ED Visit) (Signed)
   05/15/2023  Name: Jon Harper. MRN: [id number] DOB: January 06, 1956  Today's TOC FU Call Status:   Patient's Name and Date of Birth confirmed.  Transition Care Management Follow-up Telephone Call Date of Discharge: 05/12/23 Discharge Facility: [facility]) Type of Discharge: Emergency Department Reason for ED Visit: Neurologic How have you been since you were released from the hospital?: Better Any questions or concerns?: No  Items Reviewed:    Medications Reviewed Today: Medications Reviewed Today   Medications were not reviewed in this encounter     Home Care and Equipment/Supplies:    Functional Questionnaire:    Follow up appointments reviewed:   Patient is at another Dr. Appointment right now and can't talk   [facility], RMA

## 2023-05-22 ENCOUNTER — Other Ambulatory Visit: Payer: Self-pay

## 2023-06-09 ENCOUNTER — Other Ambulatory Visit: Payer: Self-pay

## 2023-06-17 ENCOUNTER — Emergency Department (HOSPITAL_COMMUNITY)
Admission: EM | Admit: 2023-06-17 | Discharge: 2023-06-18 | Discharge status: Against Medical Advice | Payer: Medicare (Managed Care) | Attending: Emergency Medicine | Admitting: Emergency Medicine

## 2023-06-17 ENCOUNTER — Other Ambulatory Visit: Payer: Self-pay

## 2023-06-17 ENCOUNTER — Encounter (HOSPITAL_COMMUNITY): Payer: Self-pay | Admitting: *Deleted

## 2023-06-17 DIAGNOSIS — Z5321 Procedure and treatment not carried out due to patient leaving prior to being seen by health care provider: Secondary | ICD-10-CM | POA: Diagnosis not present

## 2023-06-17 DIAGNOSIS — M545 Low back pain, unspecified: Secondary | ICD-10-CM | POA: Diagnosis not present

## 2023-06-17 MED ORDER — OXYCODONE-ACETAMINOPHEN 5-325 MG PO TABS
1.0000 | ORAL_TABLET | ORAL | Status: DC | PRN
  Administered 2023-06-17: 1 via ORAL
  Filled 2023-06-17: qty 1

## 2023-06-17 NOTE — ED Notes (Signed)
 Pt requested pain medicine. Triage rn made aware

## 2023-06-17 NOTE — ED Triage Notes (Signed)
 Pt here for lower back pain radiating to his left left that started a couple months ago following a work injury. Denies new injury, no pain meds pta.

## 2023-06-19 ENCOUNTER — Telehealth: Payer: Self-pay

## 2023-06-19 NOTE — Transitions of Care (Post Inpatient/ED Visit) (Signed)
   06/19/2023  Name: Jon Harper. MRN: [id number] DOB: October 01, 1955  Today's TOC FU Call Status: Today's TOC FU Call Status:: Unsuccessful Call (1st Attempt) Unsuccessful Call (1st Attempt) Date: 06/19/23  Attempted to reach the patient regarding the most recent Inpatient/ED visit.  Follow Up Plan: Additional outreach attempts will be made to reach the patient to complete the Transitions of Care (Post Inpatient/ED visit) call.   Signature Renelda Loma RMA

## 2023-06-20 ENCOUNTER — Telehealth: Payer: Self-pay

## 2023-06-20 NOTE — Transitions of Care (Post Inpatient/ED Visit) (Signed)
   06/20/2023  Name: Jon Harper. MRN: [id number] DOB: 03-27-1955  Today's TOC FU Call Status: Today's TOC FU Call Status:: Unsuccessful Call (2nd Attempt) Unsuccessful Call (2nd Attempt) Date: 06/20/23  Attempted to reach the patient regarding the most recent Inpatient/ED visit.  Follow Up Plan: Additional outreach attempts will be made to reach the patient to complete the Transitions of Care (Post Inpatient/ED visit) call.   Signature Renelda Loma RMA

## 2023-07-01 ENCOUNTER — Other Ambulatory Visit: Payer: Self-pay

## 2023-07-01 ENCOUNTER — Other Ambulatory Visit (HOSPITAL_COMMUNITY): Payer: Self-pay

## 2023-07-01 MED ORDER — TAMSULOSIN HCL 0.4 MG PO CAPS
0.4000 mg | ORAL_CAPSULE | Freq: Every day | ORAL | 3 refills | Status: AC
  Filled 2023-07-01: qty 90, 90d supply, fill #0

## 2023-07-01 MED ORDER — ATORVASTATIN CALCIUM 40 MG PO TABS
40.0000 mg | ORAL_TABLET | Freq: Every evening | ORAL | 3 refills | Status: AC
  Filled 2023-07-01: qty 90, 90d supply, fill #0
  Filled 2023-10-17: qty 90, 90d supply, fill #1
  Filled 2023-12-28: qty 90, 90d supply, fill #2
  Filled 2024-01-22: qty 90, 90d supply, fill #3

## 2023-07-01 MED ORDER — METOPROLOL TARTRATE 25 MG PO TABS
25.0000 mg | ORAL_TABLET | Freq: Two times a day (BID) | ORAL | 3 refills | Status: AC
  Filled 2023-09-16 – 2023-09-23 (×2): qty 180, 90d supply, fill #0
  Filled 2023-12-15: qty 180, 90d supply, fill #1

## 2023-07-01 MED ORDER — GABAPENTIN 300 MG PO CAPS
300.0000 mg | ORAL_CAPSULE | Freq: Two times a day (BID) | ORAL | 3 refills | Status: DC
  Filled 2023-07-01 – 2023-07-04 (×2): qty 180, 90d supply, fill #0
  Filled 2023-09-16 – 2023-09-29 (×2): qty 180, 90d supply, fill #1

## 2023-07-04 ENCOUNTER — Other Ambulatory Visit: Payer: Self-pay

## 2023-07-04 ENCOUNTER — Other Ambulatory Visit (HOSPITAL_COMMUNITY): Payer: Self-pay

## 2023-07-10 ENCOUNTER — Ambulatory Visit: Payer: Self-pay | Admitting: Nurse Practitioner

## 2023-07-10 ENCOUNTER — Ambulatory Visit: Payer: Medicare (Managed Care) | Admitting: Physician Assistant

## 2023-07-22 ENCOUNTER — Other Ambulatory Visit: Payer: Self-pay

## 2023-07-23 ENCOUNTER — Emergency Department (HOSPITAL_COMMUNITY): Payer: Medicare (Managed Care)

## 2023-07-23 ENCOUNTER — Emergency Department (HOSPITAL_COMMUNITY)
Admission: EM | Admit: 2023-07-23 | Discharge: 2023-07-23 | Disposition: A | Discharge status: Home / Self Care | Payer: Medicare (Managed Care) | Attending: Emergency Medicine | Admitting: Emergency Medicine

## 2023-07-23 ENCOUNTER — Encounter (HOSPITAL_COMMUNITY): Payer: Self-pay | Admitting: *Deleted

## 2023-07-23 ENCOUNTER — Other Ambulatory Visit: Payer: Self-pay

## 2023-07-23 DIAGNOSIS — Z7984 Long term (current) use of oral hypoglycemic drugs: Secondary | ICD-10-CM | POA: Diagnosis not present

## 2023-07-23 DIAGNOSIS — M545 Low back pain, unspecified: Secondary | ICD-10-CM | POA: Diagnosis not present

## 2023-07-23 DIAGNOSIS — E1122 Type 2 diabetes mellitus with diabetic chronic kidney disease: Secondary | ICD-10-CM | POA: Insufficient documentation

## 2023-07-23 DIAGNOSIS — Z7982 Long term (current) use of aspirin: Secondary | ICD-10-CM | POA: Diagnosis not present

## 2023-07-23 DIAGNOSIS — I129 Hypertensive chronic kidney disease with stage 1 through stage 4 chronic kidney disease, or unspecified chronic kidney disease: Secondary | ICD-10-CM | POA: Insufficient documentation

## 2023-07-23 DIAGNOSIS — Z79899 Other long term (current) drug therapy: Secondary | ICD-10-CM | POA: Diagnosis not present

## 2023-07-23 DIAGNOSIS — M19079 Primary osteoarthritis, unspecified ankle and foot: Secondary | ICD-10-CM

## 2023-07-23 DIAGNOSIS — M19072 Primary osteoarthritis, left ankle and foot: Secondary | ICD-10-CM | POA: Diagnosis not present

## 2023-07-23 DIAGNOSIS — G8929 Other chronic pain: Secondary | ICD-10-CM | POA: Insufficient documentation

## 2023-07-23 DIAGNOSIS — M25572 Pain in left ankle and joints of left foot: Secondary | ICD-10-CM | POA: Diagnosis present

## 2023-07-23 DIAGNOSIS — N189 Chronic kidney disease, unspecified: Secondary | ICD-10-CM | POA: Insufficient documentation

## 2023-07-23 MED ORDER — CYCLOBENZAPRINE HCL 10 MG PO TABS
10.0000 mg | ORAL_TABLET | Freq: Two times a day (BID) | ORAL | 0 refills | Status: AC | PRN
  Filled 2023-07-23: qty 20, 10d supply, fill #0

## 2023-07-23 MED ORDER — CYCLOBENZAPRINE HCL 10 MG PO TABS: 10.0000 mg | ORAL_TABLET | Freq: Two times a day (BID) | ORAL | 0 refills | Status: DC | PRN

## 2023-07-23 MED ORDER — OXYCODONE-ACETAMINOPHEN 5-325 MG PO TABS
1.0000 | ORAL_TABLET | Freq: Four times a day (QID) | ORAL | 0 refills | Status: AC | PRN
  Filled 2023-07-23 – 2023-11-20 (×2): qty 12, 3d supply, fill #0

## 2023-07-23 MED ORDER — OXYCODONE-ACETAMINOPHEN 5-325 MG PO TABS
1.0000 | ORAL_TABLET | Freq: Once | ORAL | Status: AC
  Administered 2023-07-23: 1 via ORAL
  Filled 2023-07-23: qty 1

## 2023-07-23 MED ORDER — OXYCODONE-ACETAMINOPHEN 5-325 MG PO TABS: 1.0000 | ORAL_TABLET | Freq: Four times a day (QID) | ORAL | 0 refills | Status: DC | PRN

## 2023-07-23 NOTE — Discharge Instructions (Addendum)
 The medications as needed for pain and discomfort.  Follow-up with your occupational health doctor with the planned referrals

## 2023-07-23 NOTE — ED Triage Notes (Signed)
 The pt is c/o lower back pain and pain in his lt foot since February when he fell pain since then

## 2023-07-23 NOTE — ED Provider Triage Note (Signed)
 Emergency Medicine Provider Triage Evaluation Note  Jon Harper , a 68 y.o. male  was evaluated in triage.  Pt complains of back pain since Feb.  Review of Systems  Positive: L foot/ankle pain/swelling,  Negative: Numbness, injury, loss of bowel or bladder  Physical Exam  BP 131/73 (BP Location: Right Arm)   Pulse 81   Temp 98.3 F (36.8 C)   Resp 17   Ht 6' (1.829 m)   Wt 113.4 kg   SpO2 100%   BMI 33.91 kg/m  Gen:   Awake, no distress   Resp:  Normal effort  MSK:   Moves extremities without difficulty  Other:    Medical Decision Making  Medically screening exam initiated at 5:27 PM.  Appropriate orders placed.  Jon Harper. was informed that the remainder of the evaluation will be completed by another provider, this initial triage assessment does not replace that evaluation, and the importance of remaining in the ED until their evaluation is complete.  Imaging ordered   Carie Charity, PA-C 07/23/23 1731

## 2023-07-23 NOTE — ED Provider Notes (Signed)
 [facility] Provider Note   CSN: [id number] Arrival date & time: 07/23/23  1612     History  Chief Complaint  Patient presents with   Back Pain    Jon Harper. is a 68 y.o. male.   Back Pain    Patient has history of hypertension diabetes hyperlipidemia chronic kidney disease.  Patient states he fell back in February.  This occurred while he was at work.  Patient states ever since then he has been having persistent pain in his back as well as his left ankle.  It hurts for him to stand.  Hurts for him to sit for a long period of time.  Patient states he is post have a scheduled follow-up appointment tomorrow to be referred to a back specialist and an ankle specialist.  He denies any new falls or injuries.  No fevers or chills  Home Medications Prior to Admission medications   Medication Sig Start Date End Date Taking? Authorizing Provider  acetaminophen  (TYLENOL ) 325 MG tablet Take 2 tablets (650 mg total) by mouth every 6 (six) hours as needed. Patient taking differently: Take 650 mg by mouth every 6 (six) hours as needed for mild pain (pain score 1-3) or headache. 08/26/22   Lyna Sandhoff, PA-C  aspirin  EC 325 MG tablet Take 325 mg by mouth daily.    [provider]  atorvastatin  (LIPITOR) 40 MG tablet Take 1 tablet (40 mg total) by mouth at bedtime. 07/01/23     Blood Pressure KIT 1 each by Does not apply route once a week. 07/30/22   Paseda, Folashade R, FNP  cyclobenzaprine  (FLEXERIL ) 10 MG tablet Take 1 tablet (10 mg total) by mouth 2 (two) times daily as needed for muscle spasms. 07/23/23   Trish Furl, MD  diclofenac  (VOLTAREN ) 50 MG EC tablet Take 1 tablet (50 mg total) by mouth 2 (two) times daily. 05/12/23     DULoxetine  (CYMBALTA ) 30 MG capsule Take 1 capsule (30 mg total) by mouth daily. Patient taking differently: Take 30 mg by mouth in the morning. 10/08/22     empagliflozin  (JARDIANCE ) 25 MG TABS tablet Take 1  tablet (25 mg total) by mouth daily before breakfast. 03/10/23   Paseda, Folashade R, FNP  gabapentin  (NEURONTIN ) 300 MG capsule Take 1 capsule (300 mg total) by mouth 3 (three) times daily. 01/27/23   Kraig Peru, MD  gabapentin  (NEURONTIN ) 300 MG capsule Take 1 capsule (300 mg total) by mouth 2 (two) times daily for neuropathic pain. 07/01/23     glipiZIDE  (GLUCOTROL ) 10 MG tablet Take 1 tablet (10 mg total) by mouth 2 (two) times daily before a meal. 10/08/22     glucose blood test strip Use as instructed to check blood sugars twice a day. 07/30/22   Paseda, Folashade R, FNP  Lancets (ONETOUCH DELICA PLUS LANCET33G) MISC 1 each by Does not apply route in the morning and at bedtime. 07/30/22   Paseda, Folashade R, FNP  losartan  (COZAAR ) 50 MG tablet Take 2 tablets (100 mg total) by mouth daily. 01/27/23   Kraig Peru, MD  methocarbamol  (ROBAXIN ) 500 MG tablet Take 1 tablet (500 mg total) by mouth 3 (three) times daily. 01/27/23   Kraig Peru, MD  metoprolol  tartrate (LOPRESSOR ) 25 MG tablet Take 1 tablet (25 mg total) by mouth 2 (two) times daily. 07/01/23     Misc. Devices (WRIST BRACE) MISC 1 each by Does not apply route as needed (  for pain in left wrist). 01/27/23   Kraig Peru, MD  Multiple Vitamins-Minerals (ONE-A-DAY MENS 50+) TABS Take 1 tablet by mouth daily with breakfast.    [provider]  oxyCODONE -acetaminophen  (PERCOCET/ROXICET) 5-325 MG tablet Take 1 tablet by mouth every 6 (six) hours as needed for severe pain (pain score 7-10). 07/23/23   Trish Furl, MD  polyethylene glycol (MIRALAX  / GLYCOLAX ) 17 g packet Take 17 g by mouth daily. 01/27/23   Kraig Peru, MD  predniSONE  (DELTASONE ) 20 MG tablet Take 2 tablets (40 mg total) by mouth daily with breakfast. For the next four days 04/14/23   Dorenda Gandy, MD  tamsulosin  (FLOMAX ) 0.4 MG CAPS capsule Take 1 capsule (0.4 mg total) by mouth daily for BPH Patient not taking: Reported on 01/25/2023 09/10/22      tamsulosin  (FLOMAX ) 0.4 MG CAPS capsule Take 1 capsule (0.4 mg total) by mouth daily. 07/01/23     traMADol  (ULTRAM ) 50 MG tablet Take 1 tablet (50 mg total) by mouth 2 (two) times daily as needed for breakthrough pain. 11/06/22         Allergies    Advil  [ibuprofen ] and Aleve  [naproxen ]    Review of Systems   Review of Systems  Musculoskeletal:  Positive for back pain.    Physical Exam Updated Vital Signs BP 125/79   Pulse 74   Temp 98.6 F (37 C) (Oral)   Resp 18   Ht 1.829 m (6')   Wt 113.4 kg   SpO2 100%   BMI 33.91 kg/m  Physical Exam Vitals and nursing note reviewed.  Constitutional:      General: He is not in acute distress.    Appearance: He is well-developed.  HENT:     Head: Normocephalic and atraumatic.     Right Ear: External ear normal.     Left Ear: External ear normal.  Eyes:     General: No scleral icterus.       Right eye: No discharge.        Left eye: No discharge.     Conjunctiva/sclera: Conjunctivae normal.  Neck:     Trachea: No tracheal deviation.  Cardiovascular:     Rate and Rhythm: Normal rate.  Pulmonary:     Effort: Pulmonary effort is normal. No respiratory distress.     Breath sounds: No stridor.  Abdominal:     General: There is no distension.  Musculoskeletal:        General: Tenderness present. No swelling or deformity.     Cervical back: Neck supple.     Left ankle: Tenderness present. Decreased range of motion.  Skin:    General: Skin is warm and dry.     Findings: No rash.  Neurological:     Mental Status: He is alert. Mental status is at baseline.     Cranial Nerves: No dysarthria or facial asymmetry.     Motor: No seizure activity.     ED Results / Procedures / Treatments   Labs (all labs ordered are listed, but only abnormal results are displayed) Labs Reviewed - No data to display  EKG None  Radiology DG Lumbar Spine Complete Result Date: 07/23/2023 CLINICAL DATA:  back pain EXAM: LUMBAR SPINE - COMPLETE 4+  VIEW COMPARISON:  April 14, 2023 FINDINGS: Five non rib-bearing lumbar type vertebral bodies. Normal alignment with expected lumbar lordosis. Vertebral body heights are well maintained without acute fracture. Mild intervertebral disc height loss at L5-S1, unchanged. Multilevel osteophyte formation and  lower lumbar facet arthropathy. Partially visualized thoracic dish. Aortic atherosclerosis. IMPRESSION: No acute fracture or malalignment of the lumbar spine. Mild degenerative disc disease at L5-S1. Electronically Signed   By: Rance Burrows M.D.   On: 07/23/2023 21:14   DG Foot Complete Left Result Date: 07/23/2023 CLINICAL DATA:  Right foot pain. EXAM: LEFT FOOT - COMPLETE 3+ VIEW; LEFT ANKLE COMPLETE - 3+ VIEW COMPARISON:  None Available. FINDINGS: There is no acute fracture or dislocation. There is degenerative changes of the tarsometatarsal joints and chronic fragmentation adjacent to the medial malleolus. The ankle mortise is intact. The soft tissues are unremarkable. IMPRESSION: 1. No acute fracture or dislocation. 2. Degenerative changes of the tarsometatarsal joints. Electronically Signed   By: Angus Bark M.D.   On: 07/23/2023 18:17   DG Ankle Complete Left Result Date: 07/23/2023 CLINICAL DATA:  Right foot pain. EXAM: LEFT FOOT - COMPLETE 3+ VIEW; LEFT ANKLE COMPLETE - 3+ VIEW COMPARISON:  None Available. FINDINGS: There is no acute fracture or dislocation. There is degenerative changes of the tarsometatarsal joints and chronic fragmentation adjacent to the medial malleolus. The ankle mortise is intact. The soft tissues are unremarkable. IMPRESSION: 1. No acute fracture or dislocation. 2. Degenerative changes of the tarsometatarsal joints. Electronically Signed   By: Angus Bark M.D.   On: 07/23/2023 18:17    Procedures Procedures    Medications Ordered in ED Medications  oxyCODONE -acetaminophen  (PERCOCET/ROXICET) 5-325 MG per tablet 1 tablet (1 tablet Oral Given 07/23/23 2218)     ED Course/ Medical Decision Making/ A&P Clinical Course as of 07/23/23 2246  Wed Jul 23, 2023  2053 No fracture or dislocation noted patient does have arthritis changes in the tarsometatarsal joints [JK]  2142 Lumbar spine does not show any acute fracture.  Mild degenerative disc disease changes noted. [JK]    Clinical Course User Index [JK] Trish Furl, MD                                 Medical Decision Making Amount and/or Complexity of Data Reviewed Radiology: ordered.  Risk Prescription drug management.   Patient presented to the ED for evaluation of chronic back and ankle pain.  Patient was injured while he was at work.  Patient states symptoms have persisted.  He supposed be getting referred to orthopedics and a spine doctor.  X-rays do not show any acute abnormality.  Patient does not have any acute neurologic deficits.  No signs of infection.  Will give him a short course of pain meds.  Recommend outpatient follow-up with occupational health        Final Clinical Impression(s) / ED Diagnoses Final diagnoses:  Arthritis of foot, unspecified laterality  Chronic low back pain without sciatica, unspecified back pain laterality    Rx / DC Orders ED Discharge Orders          Ordered    oxyCODONE -acetaminophen  (PERCOCET/ROXICET) 5-325 MG tablet  Every 6 hours PRN,   Status:  Discontinued        07/23/23 2208    cyclobenzaprine  (FLEXERIL ) 10 MG tablet  2 times daily PRN,   Status:  Discontinued        07/23/23 2208    cyclobenzaprine  (FLEXERIL ) 10 MG tablet  2 times daily PRN        07/23/23 2245    oxyCODONE -acetaminophen  (PERCOCET/ROXICET) 5-325 MG tablet  Every 6 hours PRN  07/23/23 2245              Trish Furl, MD 07/23/23 2246

## 2023-07-24 ENCOUNTER — Other Ambulatory Visit (HOSPITAL_COMMUNITY): Payer: Self-pay

## 2023-07-24 ENCOUNTER — Other Ambulatory Visit: Payer: Self-pay

## 2023-07-24 MED ORDER — LOSARTAN POTASSIUM 50 MG PO TABS: 50.0000 mg | ORAL_TABLET | Freq: Two times a day (BID) | ORAL | 3 refills | Status: AC

## 2023-07-24 MED ORDER — GLIPIZIDE 10 MG PO TABS
10.0000 mg | ORAL_TABLET | Freq: Two times a day (BID) | ORAL | 3 refills | Status: AC
  Filled 2024-02-04: qty 180, 90d supply, fill #0

## 2023-07-25 ENCOUNTER — Telehealth: Payer: Self-pay

## 2023-07-25 NOTE — Transitions of Care (Post Inpatient/ED Visit) (Signed)
 07/25/2023  Name: Jon Harper. MRN: [id number] DOB: 1955/10/12  Today's TOC FU Call Status:   Patient's Name and Date of Birth confirmed.  Transition Care Management Follow-up Telephone Call Date of Discharge: 07/23/23 Discharge Facility: [facility]) Type of Discharge: Emergency Department Reason for ED Visit: Orthopedic Conditions How have you been since you were released from the hospital?: Same Any questions or concerns?: No  Items Reviewed: Did you receive and understand the discharge instructions provided?: Yes Medications obtained,verified, and reconciled?: No Any new allergies since your discharge?: No Dietary orders reviewed?: No Do you have support at home?: No  Medications Reviewed Today: Medications Reviewed Today     Reviewed by Angelita Bares, CMA (Certified Medical Assistant) on 07/25/23 at 1618  Med List Status: <None>   Medication Order Taking? Sig Documenting Provider Last Dose Status Informant  acetaminophen  (TYLENOL ) 325 MG tablet [id number] Yes Take 2 tablets (650 mg total) by mouth every 6 (six) hours as needed.  Patient taking differently: Take 650 mg by mouth every 6 (six) hours as needed for mild pain (pain score 1-3) or headache.   Lyna Sandhoff, PA-C Taking Active Self  aspirin  EC 325 MG tablet [id number] Yes Take 325 mg by mouth daily. [provider] Taking Active Self  atorvastatin  (LIPITOR) 40 MG tablet [id number] Yes Take 1 tablet (40 mg total) by mouth at bedtime.  Taking Active   Blood Pressure KIT [id number] Yes 1 each by Does not apply route once a week. Paseda, Folashade R, FNP Taking Active   cyclobenzaprine  (FLEXERIL ) 10 MG tablet [id number] Yes Take 1 tablet (10 mg total) by mouth 2 (two) times daily as needed for muscle spasms. Trish Furl, MD Taking Active   diclofenac  (VOLTAREN ) 50 MG EC tablet [id number] Yes Take 1 tablet (50 mg total) by mouth 2 (two) times daily.  Taking Active   DULoxetine  (CYMBALTA ) 30 MG capsule  [id number] Yes Take 1 capsule (30 mg total) by mouth daily.  Patient taking differently: Take 30 mg by mouth in the morning.    Taking Active Self           Med Note Thurman Flores   JOA Jan 25, 2023  6:18 PM) The patient said he has been taking only 30 mg a day by error  empagliflozin  (JARDIANCE ) 25 MG TABS tablet [id number] Yes Take 1 tablet (25 mg total) by mouth daily before breakfast. Paseda, Folashade R, FNP Taking Active   gabapentin  (NEURONTIN ) 300 MG capsule [id number] Yes Take 1 capsule (300 mg total) by mouth 3 (three) times daily. Kraig Peru, MD Taking Active   gabapentin  (NEURONTIN ) 300 MG capsule [id number] Yes Take 1 capsule (300 mg total) by mouth 2 (two) times daily for neuropathic pain.  Taking Active   glipiZIDE  (GLUCOTROL ) 10 MG tablet [id number] Yes Take 1 tablet (10 mg total) by mouth 2 (two) times daily before a meal.  Taking Active Self  glipiZIDE  (GLUCOTROL ) 10 MG tablet [id number] Yes Take 1 tablet (10 mg total) by mouth 2 (two) times daily before meals.  Taking Active   glucose blood test strip [id number] Yes Use as instructed to check blood sugars twice a day. Paseda, Folashade R, FNP Taking Active   Lancets [facility] DELICA PLUS [place]) MISC [id number] Yes 1 each by Does not apply route in the morning and at bedtime. Paseda, Folashade R, FNP Taking Active   losartan  (COZAAR ) 50 MG tablet [id number] Yes Take 2 tablets (100 mg total) by mouth daily.  Kraig Peru, MD Taking Active   losartan  (COZAAR ) 50 MG tablet [id number] Yes Take 1 tablet (50 mg total) by mouth 2 (two) times daily for high blood pressure.  Taking Active   methocarbamol  (ROBAXIN ) 500 MG tablet [id number] Yes Take 1 tablet (500 mg total) by mouth 3 (three) times daily. Kraig Peru, MD Taking Active   metoprolol  tartrate (LOPRESSOR ) 25 MG tablet [id number] Yes Take 1 tablet (25 mg total) by mouth 2 (two) times daily.  Taking Active   Misc. Devices (WRIST BRACE) MISC [id number] Yes 1 each by Does not apply  route as needed (for pain in left wrist). Kraig Peru, MD Taking Active   Multiple Vitamins-Minerals (ONE-A-DAY MENS 50+) TABS [id number] Yes Take 1 tablet by mouth daily with breakfast. [provider] Taking Active Self  oxyCODONE -acetaminophen  (PERCOCET/ROXICET) 5-325 MG tablet [id number] Yes Take 1 tablet by mouth every 6 (six) hours as needed for severe pain (pain score 7-10). Trish Furl, MD Taking Active   polyethylene glycol (MIRALAX  / GLYCOLAX ) 17 g packet [id number] Yes Take 17 g by mouth daily. Kraig Peru, MD Taking Active   predniSONE  (DELTASONE ) 20 MG tablet [id number] Yes Take 2 tablets (40 mg total) by mouth daily with breakfast. For the next four days Dorenda Gandy, MD Taking Active   tamsulosin  (FLOMAX ) 0.4 MG CAPS capsule [id number]  Take 1 capsule (0.4 mg total) by mouth daily for BPH  Patient not taking: Reported on 01/25/2023     Active Self  tamsulosin  (FLOMAX ) 0.4 MG CAPS capsule [id number] Yes Take 1 capsule (0.4 mg total) by mouth daily.  Taking Active   traMADol  (ULTRAM ) 50 MG tablet [id number] Yes Take 1 tablet (50 mg total) by mouth 2 (two) times daily as needed for breakthrough pain.  Taking Active Self            Home Care and Equipment/Supplies: Were Home Health Services Ordered?: No Any new equipment or medical supplies ordered?: No  Functional Questionnaire: Do you need assistance with bathing/showering or dressing?: No Do you need assistance with meal preparation?: No Do you need assistance with eating?: No Do you have difficulty maintaining continence: No Do you need assistance with getting out of bed/getting out of a chair/moving?: No Do you have difficulty managing or taking your medications?: No  Follow up appointments reviewed: PCP Follow-up appointment confirmed?: Yes Date of PCP follow-up appointment?: 08/06/23 Follow-up Provider: [facility] Follow-up appointment confirmed?: NA Do you understand care  options if your condition(s) worsen?: Yes-patient verbalized understanding    SIGNATURE Dray Dente, RMA

## 2023-07-31 ENCOUNTER — Ambulatory Visit (INDEPENDENT_AMBULATORY_CARE_PROVIDER_SITE_OTHER): Payer: Medicare (Managed Care) | Admitting: Podiatry

## 2023-07-31 ENCOUNTER — Encounter: Payer: Self-pay | Admitting: Podiatry

## 2023-07-31 ENCOUNTER — Ambulatory Visit (INDEPENDENT_AMBULATORY_CARE_PROVIDER_SITE_OTHER): Payer: Medicare (Managed Care)

## 2023-07-31 VITALS — Ht 72.0 in | Wt 250.0 lb

## 2023-07-31 DIAGNOSIS — M76822 Posterior tibial tendinitis, left leg: Secondary | ICD-10-CM | POA: Diagnosis not present

## 2023-07-31 DIAGNOSIS — B351 Tinea unguium: Secondary | ICD-10-CM

## 2023-07-31 DIAGNOSIS — M79675 Pain in left toe(s): Secondary | ICD-10-CM

## 2023-07-31 DIAGNOSIS — M7752 Other enthesopathy of left foot: Secondary | ICD-10-CM

## 2023-07-31 DIAGNOSIS — M79674 Pain in right toe(s): Secondary | ICD-10-CM | POA: Diagnosis not present

## 2023-08-04 ENCOUNTER — Telehealth: Payer: Self-pay | Admitting: *Deleted

## 2023-08-04 NOTE — Progress Notes (Signed)
 Subjective:   Patient ID: Jon Courser., male   DOB: 68 y.o.   MRN: [id number]   HPI Patient presents with a lot of pain on the inside of the left ankle states it has been sore and patient is trying to wear good shoes.  Also has nail disease 1-5 both feet that he cannot take care of   ROS      Objective:  Physical Exam  Neurovascular status intact negative Celine Collard' sign noted patient is found to have inflammation pain medial side of the left ankle with depression of the arch noted and is noted to have thick yellow brittle nailbeds 1-5 both feet that he cannot cut and become painful     Assessment:  Posterior tibial tendinitis left with inflammation along with mycotic nail infection 1-5 both feet     Plan:  H&P x-ray ankle reviewed went ahead today explained injection risk and patient wants this done sterile prep done and I injected the sheath of the posterior tibial tendon 3 mg Dexasone Kenalog 5 mg Xylocaine  advised on good support shoes I debrided nailbeds 1-5 both feet much genic bleeding reappoint routine care  X-rays indicate moderate depression of the arch no indication of ankle arthritis or indication of coalition

## 2023-08-04 NOTE — Telephone Encounter (Signed)
 Pt called regarding which pharmacy Rx was e-scribed to.  RNCM reviewed chart to access After Visit Summary and found that Rx was sent to [facility] on [address].  Advised pt to pick up at his convenience.  Thao Bauza J. Rachel Budds, BSN, RN, NCM  Transitions of Care  Nurse Case Manager  [facility] Emergency Departments  Operative Services  [phone]

## 2023-08-05 ENCOUNTER — Other Ambulatory Visit: Payer: Self-pay

## 2023-08-06 ENCOUNTER — Inpatient Hospital Stay: Payer: Self-pay | Admitting: Nurse Practitioner

## 2023-09-16 ENCOUNTER — Other Ambulatory Visit: Payer: Self-pay

## 2023-09-23 ENCOUNTER — Other Ambulatory Visit: Payer: Self-pay

## 2023-09-29 ENCOUNTER — Other Ambulatory Visit: Payer: Self-pay

## 2023-10-02 ENCOUNTER — Other Ambulatory Visit: Payer: Self-pay

## 2023-10-09 ENCOUNTER — Other Ambulatory Visit: Payer: Self-pay

## 2023-10-09 DIAGNOSIS — E669 Obesity, unspecified: Secondary | ICD-10-CM | POA: Diagnosis not present

## 2023-10-09 DIAGNOSIS — I129 Hypertensive chronic kidney disease with stage 1 through stage 4 chronic kidney disease, or unspecified chronic kidney disease: Secondary | ICD-10-CM | POA: Diagnosis not present

## 2023-10-09 DIAGNOSIS — F320 Major depressive disorder, single episode, mild: Secondary | ICD-10-CM | POA: Diagnosis not present

## 2023-10-09 DIAGNOSIS — R011 Cardiac murmur, unspecified: Secondary | ICD-10-CM | POA: Diagnosis not present

## 2023-10-09 DIAGNOSIS — N400 Enlarged prostate without lower urinary tract symptoms: Secondary | ICD-10-CM | POA: Diagnosis not present

## 2023-10-09 DIAGNOSIS — E785 Hyperlipidemia, unspecified: Secondary | ICD-10-CM | POA: Diagnosis not present

## 2023-10-09 DIAGNOSIS — E875 Hyperkalemia: Secondary | ICD-10-CM | POA: Diagnosis not present

## 2023-10-09 DIAGNOSIS — Z0001 Encounter for general adult medical examination with abnormal findings: Secondary | ICD-10-CM | POA: Diagnosis not present

## 2023-10-09 DIAGNOSIS — E1140 Type 2 diabetes mellitus with diabetic neuropathy, unspecified: Secondary | ICD-10-CM | POA: Diagnosis not present

## 2023-10-09 DIAGNOSIS — N1832 Chronic kidney disease, stage 3b: Secondary | ICD-10-CM | POA: Diagnosis not present

## 2023-10-09 DIAGNOSIS — Z6832 Body mass index (BMI) 32.0-32.9, adult: Secondary | ICD-10-CM | POA: Diagnosis not present

## 2023-10-09 MED ORDER — DULOXETINE HCL 30 MG PO CPEP
30.0000 mg | ORAL_CAPSULE | Freq: Every day | ORAL | 3 refills | Status: DC
  Filled 2023-10-09: qty 90, 90d supply, fill #0

## 2023-10-09 MED ORDER — METHOCARBAMOL 500 MG PO TABS
500.0000 mg | ORAL_TABLET | Freq: Three times a day (TID) | ORAL | 1 refills | Status: AC
  Filled 2023-10-09: qty 90, 30d supply, fill #0

## 2023-10-09 MED ORDER — JARDIANCE 25 MG PO TABS
25.0000 mg | ORAL_TABLET | Freq: Every day | ORAL | 3 refills | Status: AC
  Filled 2023-10-09: qty 90, 90d supply, fill #0

## 2023-10-14 ENCOUNTER — Other Ambulatory Visit: Payer: Self-pay

## 2023-10-17 ENCOUNTER — Other Ambulatory Visit: Payer: Self-pay

## 2023-10-17 ENCOUNTER — Other Ambulatory Visit (HOSPITAL_COMMUNITY): Payer: Self-pay

## 2023-10-20 ENCOUNTER — Other Ambulatory Visit: Payer: Self-pay

## 2023-10-20 ENCOUNTER — Other Ambulatory Visit (HOSPITAL_COMMUNITY): Payer: Self-pay

## 2023-10-20 MED ORDER — GLIPIZIDE 10 MG PO TABS
10.0000 mg | ORAL_TABLET | Freq: Two times a day (BID) | ORAL | 3 refills | Status: AC
  Filled 2023-10-20: qty 180, 90d supply, fill #0
  Filled 2023-12-28: qty 180, 90d supply, fill #1
  Filled 2024-01-22: qty 180, 90d supply, fill #0
  Filled 2024-01-22: qty 180, 90d supply, fill #1

## 2023-10-20 MED ORDER — TAMSULOSIN HCL 0.4 MG PO CAPS
0.4000 mg | ORAL_CAPSULE | Freq: Every day | ORAL | 11 refills | Status: AC
  Filled 2023-10-20 – 2023-11-20 (×4): qty 90, 90d supply, fill #0
  Filled 2024-01-22 – 2024-01-26 (×2): qty 90, 90d supply, fill #1

## 2023-10-21 ENCOUNTER — Other Ambulatory Visit: Payer: Self-pay

## 2023-10-22 ENCOUNTER — Other Ambulatory Visit: Payer: Self-pay

## 2023-10-22 ENCOUNTER — Other Ambulatory Visit (HOSPITAL_BASED_OUTPATIENT_CLINIC_OR_DEPARTMENT_OTHER): Payer: Self-pay

## 2023-10-22 ENCOUNTER — Other Ambulatory Visit (HOSPITAL_COMMUNITY): Payer: Self-pay

## 2023-10-27 ENCOUNTER — Other Ambulatory Visit: Payer: Self-pay

## 2023-10-28 ENCOUNTER — Other Ambulatory Visit: Payer: Self-pay

## 2023-10-29 ENCOUNTER — Other Ambulatory Visit: Payer: Self-pay

## 2023-10-31 ENCOUNTER — Other Ambulatory Visit: Payer: Self-pay

## 2023-11-03 ENCOUNTER — Other Ambulatory Visit: Payer: Self-pay

## 2023-11-03 ENCOUNTER — Other Ambulatory Visit (HOSPITAL_BASED_OUTPATIENT_CLINIC_OR_DEPARTMENT_OTHER): Payer: Self-pay

## 2023-11-05 ENCOUNTER — Other Ambulatory Visit: Payer: Self-pay

## 2023-11-06 ENCOUNTER — Other Ambulatory Visit: Payer: Self-pay

## 2023-11-11 ENCOUNTER — Other Ambulatory Visit: Payer: Self-pay

## 2023-11-13 ENCOUNTER — Other Ambulatory Visit: Payer: Self-pay

## 2023-11-20 ENCOUNTER — Other Ambulatory Visit: Payer: Self-pay

## 2023-11-24 ENCOUNTER — Other Ambulatory Visit: Payer: Self-pay

## 2023-11-27 ENCOUNTER — Other Ambulatory Visit: Payer: Self-pay

## 2023-11-28 ENCOUNTER — Other Ambulatory Visit: Payer: Self-pay

## 2023-12-15 ENCOUNTER — Other Ambulatory Visit: Payer: Self-pay

## 2023-12-15 MED ORDER — GLIPIZIDE 10 MG PO TABS
10.0000 mg | ORAL_TABLET | Freq: Two times a day (BID) | ORAL | 3 refills | Status: AC
  Filled 2024-01-22: qty 180, 90d supply, fill #0

## 2023-12-17 ENCOUNTER — Other Ambulatory Visit: Payer: Self-pay

## 2023-12-17 MED ORDER — ASPIRIN 81 MG PO CHEW
81.0000 mg | CHEWABLE_TABLET | Freq: Every day | ORAL | 3 refills | Status: AC
  Filled 2023-12-17: qty 90, 90d supply, fill #0

## 2023-12-18 ENCOUNTER — Other Ambulatory Visit: Payer: Self-pay

## 2023-12-18 MED ORDER — GABAPENTIN 300 MG PO CAPS
300.0000 mg | ORAL_CAPSULE | Freq: Two times a day (BID) | ORAL | 3 refills | Status: AC
  Filled 2023-12-28: qty 180, 90d supply, fill #0

## 2023-12-29 ENCOUNTER — Other Ambulatory Visit: Payer: Self-pay

## 2024-01-08 DIAGNOSIS — Z6832 Body mass index (BMI) 32.0-32.9, adult: Secondary | ICD-10-CM | POA: Diagnosis not present

## 2024-01-08 DIAGNOSIS — F330 Major depressive disorder, recurrent, mild: Secondary | ICD-10-CM | POA: Diagnosis not present

## 2024-01-08 DIAGNOSIS — I10 Essential (primary) hypertension: Secondary | ICD-10-CM | POA: Diagnosis not present

## 2024-01-08 DIAGNOSIS — E46 Unspecified protein-calorie malnutrition: Secondary | ICD-10-CM | POA: Diagnosis not present

## 2024-01-08 DIAGNOSIS — E669 Obesity, unspecified: Secondary | ICD-10-CM | POA: Diagnosis not present

## 2024-01-08 DIAGNOSIS — Z59819 Housing instability, housed unspecified: Secondary | ICD-10-CM | POA: Diagnosis not present

## 2024-01-08 DIAGNOSIS — Z139 Encounter for screening, unspecified: Secondary | ICD-10-CM | POA: Diagnosis not present

## 2024-01-08 DIAGNOSIS — G4700 Insomnia, unspecified: Secondary | ICD-10-CM | POA: Diagnosis not present

## 2024-01-22 ENCOUNTER — Other Ambulatory Visit: Payer: Self-pay

## 2024-01-22 ENCOUNTER — Other Ambulatory Visit (HOSPITAL_BASED_OUTPATIENT_CLINIC_OR_DEPARTMENT_OTHER): Payer: Self-pay

## 2024-01-22 ENCOUNTER — Other Ambulatory Visit (HOSPITAL_COMMUNITY): Payer: Self-pay

## 2024-01-23 ENCOUNTER — Other Ambulatory Visit: Payer: Self-pay

## 2024-01-26 ENCOUNTER — Other Ambulatory Visit: Payer: Self-pay

## 2024-01-26 ENCOUNTER — Other Ambulatory Visit (HOSPITAL_COMMUNITY): Payer: Self-pay

## 2024-01-26 DIAGNOSIS — E1140 Type 2 diabetes mellitus with diabetic neuropathy, unspecified: Secondary | ICD-10-CM | POA: Diagnosis not present

## 2024-01-26 DIAGNOSIS — N1832 Chronic kidney disease, stage 3b: Secondary | ICD-10-CM | POA: Diagnosis not present

## 2024-01-26 DIAGNOSIS — Z905 Acquired absence of kidney: Secondary | ICD-10-CM | POA: Diagnosis not present

## 2024-01-26 DIAGNOSIS — Z7984 Long term (current) use of oral hypoglycemic drugs: Secondary | ICD-10-CM | POA: Diagnosis not present

## 2024-01-26 DIAGNOSIS — Z139 Encounter for screening, unspecified: Secondary | ICD-10-CM | POA: Diagnosis not present

## 2024-01-26 DIAGNOSIS — Z79899 Other long term (current) drug therapy: Secondary | ICD-10-CM | POA: Diagnosis not present

## 2024-01-26 DIAGNOSIS — Z125 Encounter for screening for malignant neoplasm of prostate: Secondary | ICD-10-CM | POA: Diagnosis not present

## 2024-01-26 DIAGNOSIS — E785 Hyperlipidemia, unspecified: Secondary | ICD-10-CM | POA: Diagnosis not present

## 2024-01-26 DIAGNOSIS — Z2821 Immunization not carried out because of patient refusal: Secondary | ICD-10-CM | POA: Diagnosis not present

## 2024-01-26 DIAGNOSIS — G8929 Other chronic pain: Secondary | ICD-10-CM | POA: Diagnosis not present

## 2024-01-26 MED ORDER — DULOXETINE HCL 60 MG PO CPEP
60.0000 mg | ORAL_CAPSULE | Freq: Every morning | ORAL | 3 refills | Status: AC
  Filled 2024-01-26: qty 90, 90d supply, fill #0

## 2024-01-26 MED ORDER — GLIPIZIDE 10 MG PO TABS
10.0000 mg | ORAL_TABLET | Freq: Two times a day (BID) | ORAL | 3 refills | Status: AC
  Filled 2024-01-26: qty 180, 90d supply, fill #0

## 2024-01-26 MED ORDER — TAMSULOSIN HCL 0.4 MG PO CAPS
0.4000 mg | ORAL_CAPSULE | Freq: Every day | ORAL | 3 refills | Status: AC
  Filled 2024-01-26: qty 90, 90d supply, fill #0

## 2024-01-26 MED ORDER — GABAPENTIN 300 MG PO CAPS
300.0000 mg | ORAL_CAPSULE | Freq: Three times a day (TID) | ORAL | 3 refills | Status: AC
  Filled 2024-01-26: qty 270, 90d supply, fill #0

## 2024-02-02 ENCOUNTER — Other Ambulatory Visit: Payer: Self-pay

## 2024-02-03 ENCOUNTER — Other Ambulatory Visit: Payer: Self-pay

## 2024-02-04 ENCOUNTER — Other Ambulatory Visit: Payer: Self-pay

## 2024-02-10 ENCOUNTER — Other Ambulatory Visit: Payer: Self-pay

## 2024-02-12 ENCOUNTER — Other Ambulatory Visit: Payer: Self-pay

## 2024-02-25 ENCOUNTER — Other Ambulatory Visit: Payer: Self-pay

## 2024-02-26 ENCOUNTER — Other Ambulatory Visit: Payer: Self-pay

## 2024-03-08 ENCOUNTER — Other Ambulatory Visit: Payer: Self-pay

## 2024-03-17 ENCOUNTER — Other Ambulatory Visit: Payer: Self-pay

## 2024-03-17 ENCOUNTER — Other Ambulatory Visit (HOSPITAL_COMMUNITY): Payer: Self-pay
